# Patient Record
Sex: Female | Born: 1960
Health system: Southern US, Community
[De-identification: ages and names within clinical notes are randomized; demographics above are authoritative.]

## PROBLEM LIST (undated history)

## (undated) DIAGNOSIS — T7840XA Allergy, unspecified, initial encounter: Secondary | ICD-10-CM

## (undated) DIAGNOSIS — F419 Anxiety disorder, unspecified: Secondary | ICD-10-CM

## (undated) DIAGNOSIS — M1990 Unspecified osteoarthritis, unspecified site: Secondary | ICD-10-CM

## (undated) DIAGNOSIS — D649 Anemia, unspecified: Secondary | ICD-10-CM

## (undated) DIAGNOSIS — K219 Gastro-esophageal reflux disease without esophagitis: Secondary | ICD-10-CM

## (undated) DIAGNOSIS — I10 Essential (primary) hypertension: Secondary | ICD-10-CM

## (undated) DIAGNOSIS — E079 Disorder of thyroid, unspecified: Secondary | ICD-10-CM

## (undated) DIAGNOSIS — E119 Type 2 diabetes mellitus without complications: Secondary | ICD-10-CM

## (undated) DIAGNOSIS — E785 Hyperlipidemia, unspecified: Secondary | ICD-10-CM

## (undated) DIAGNOSIS — R011 Cardiac murmur, unspecified: Secondary | ICD-10-CM

## (undated) HISTORY — DX: Hyperlipidemia, unspecified: E78.5

## (undated) HISTORY — DX: Unspecified osteoarthritis, unspecified site: M19.90

## (undated) HISTORY — PX: BREAST BIOPSY: SHX20

## (undated) HISTORY — DX: Cardiac murmur, unspecified: R01.1

## (undated) HISTORY — DX: Gastro-esophageal reflux disease without esophagitis: K21.9

## (undated) HISTORY — DX: Anemia, unspecified: D64.9

## (undated) HISTORY — PX: COLONOSCOPY: SHX174

## (undated) HISTORY — DX: Anxiety disorder, unspecified: F41.9

## (undated) HISTORY — DX: Essential (primary) hypertension: I10

## (undated) HISTORY — PX: PARTIAL HYSTERECTOMY: SHX80

## (undated) HISTORY — DX: Type 2 diabetes mellitus without complications: E11.9

## (undated) HISTORY — DX: Disorder of thyroid, unspecified: E07.9

## (undated) HISTORY — DX: Allergy, unspecified, initial encounter: T78.40XA

---

## 2003-10-27 ENCOUNTER — Encounter: Admission: RE | Admit: 2003-10-27 | Discharge: 2003-10-27 | Payer: Self-pay | Admitting: Family Medicine

## 2003-12-11 ENCOUNTER — Emergency Department (HOSPITAL_COMMUNITY): Admission: EM | Admit: 2003-12-11 | Discharge: 2003-12-11 | Payer: Self-pay | Admitting: Emergency Medicine

## 2004-07-19 ENCOUNTER — Emergency Department (HOSPITAL_COMMUNITY): Admission: EM | Admit: 2004-07-19 | Discharge: 2004-07-19 | Payer: Self-pay | Admitting: Emergency Medicine

## 2004-10-28 ENCOUNTER — Encounter: Admission: RE | Admit: 2004-10-28 | Discharge: 2004-10-28 | Payer: Self-pay | Admitting: Internal Medicine

## 2005-02-25 ENCOUNTER — Emergency Department (HOSPITAL_COMMUNITY): Admission: EM | Admit: 2005-02-25 | Discharge: 2005-02-26 | Payer: Self-pay | Admitting: Emergency Medicine

## 2005-04-17 ENCOUNTER — Emergency Department (HOSPITAL_COMMUNITY): Admission: EM | Admit: 2005-04-17 | Discharge: 2005-04-17 | Payer: Self-pay | Admitting: Emergency Medicine

## 2006-08-21 ENCOUNTER — Other Ambulatory Visit: Admission: RE | Admit: 2006-08-21 | Discharge: 2006-08-21 | Payer: Self-pay | Admitting: Family Medicine

## 2006-11-06 ENCOUNTER — Encounter: Admission: RE | Admit: 2006-11-06 | Discharge: 2006-11-06 | Payer: Self-pay | Admitting: Family Medicine

## 2007-02-17 ENCOUNTER — Emergency Department (HOSPITAL_COMMUNITY): Admission: EM | Admit: 2007-02-17 | Discharge: 2007-02-17 | Payer: Self-pay | Admitting: Emergency Medicine

## 2007-03-04 ENCOUNTER — Emergency Department (HOSPITAL_COMMUNITY): Admission: EM | Admit: 2007-03-04 | Discharge: 2007-03-04 | Payer: Self-pay | Admitting: Emergency Medicine

## 2007-06-24 ENCOUNTER — Ambulatory Visit (HOSPITAL_COMMUNITY): Admission: RE | Admit: 2007-06-24 | Discharge: 2007-06-24 | Payer: Self-pay | Admitting: Hematology & Oncology

## 2007-07-01 ENCOUNTER — Emergency Department (HOSPITAL_COMMUNITY): Admission: EM | Admit: 2007-07-01 | Discharge: 2007-07-01 | Payer: Self-pay | Admitting: Emergency Medicine

## 2007-07-08 ENCOUNTER — Encounter: Payer: Self-pay | Admitting: Endocrinology

## 2007-09-16 ENCOUNTER — Encounter: Payer: Self-pay | Admitting: Endocrinology

## 2007-11-14 ENCOUNTER — Encounter: Payer: Self-pay | Admitting: Endocrinology

## 2007-11-14 ENCOUNTER — Encounter: Admission: RE | Admit: 2007-11-14 | Discharge: 2007-11-14 | Payer: Self-pay | Admitting: Family Medicine

## 2008-01-02 ENCOUNTER — Emergency Department (HOSPITAL_COMMUNITY): Admission: EM | Admit: 2008-01-02 | Discharge: 2008-01-03 | Payer: Self-pay | Admitting: Emergency Medicine

## 2008-03-03 ENCOUNTER — Emergency Department (HOSPITAL_COMMUNITY): Admission: EM | Admit: 2008-03-03 | Discharge: 2008-03-03 | Payer: Self-pay | Admitting: Emergency Medicine

## 2008-03-10 ENCOUNTER — Encounter: Payer: Self-pay | Admitting: Endocrinology

## 2008-04-01 DIAGNOSIS — E559 Vitamin D deficiency, unspecified: Secondary | ICD-10-CM | POA: Insufficient documentation

## 2008-04-01 DIAGNOSIS — E0590 Thyrotoxicosis, unspecified without thyrotoxic crisis or storm: Secondary | ICD-10-CM | POA: Insufficient documentation

## 2008-04-02 ENCOUNTER — Ambulatory Visit: Payer: Self-pay | Admitting: Endocrinology

## 2008-04-09 ENCOUNTER — Encounter (HOSPITAL_COMMUNITY): Admission: RE | Admit: 2008-04-09 | Discharge: 2008-06-04 | Payer: Self-pay | Admitting: Endocrinology

## 2008-04-14 ENCOUNTER — Telehealth: Payer: Self-pay | Admitting: Endocrinology

## 2008-05-15 ENCOUNTER — Ambulatory Visit (HOSPITAL_COMMUNITY): Admission: RE | Admit: 2008-05-15 | Discharge: 2008-05-15 | Payer: Self-pay | Admitting: Endocrinology

## 2008-05-19 ENCOUNTER — Telehealth (INDEPENDENT_AMBULATORY_CARE_PROVIDER_SITE_OTHER): Payer: Self-pay | Admitting: *Deleted

## 2008-09-17 ENCOUNTER — Other Ambulatory Visit: Admission: RE | Admit: 2008-09-17 | Discharge: 2008-09-17 | Payer: Self-pay | Admitting: Family Medicine

## 2008-09-21 ENCOUNTER — Emergency Department (HOSPITAL_COMMUNITY): Admission: EM | Admit: 2008-09-21 | Discharge: 2008-09-21 | Payer: Self-pay | Admitting: Emergency Medicine

## 2008-10-06 ENCOUNTER — Emergency Department (HOSPITAL_COMMUNITY): Admission: EM | Admit: 2008-10-06 | Discharge: 2008-10-06 | Payer: Self-pay | Admitting: Emergency Medicine

## 2008-11-17 ENCOUNTER — Encounter: Admission: RE | Admit: 2008-11-17 | Discharge: 2008-11-17 | Payer: Self-pay | Admitting: Family Medicine

## 2009-11-18 ENCOUNTER — Encounter: Admission: RE | Admit: 2009-11-18 | Discharge: 2009-11-18 | Payer: Self-pay | Admitting: Internal Medicine

## 2009-11-28 ENCOUNTER — Emergency Department (HOSPITAL_COMMUNITY): Admission: EM | Admit: 2009-11-28 | Discharge: 2009-11-28 | Payer: Self-pay | Admitting: Emergency Medicine

## 2010-06-25 ENCOUNTER — Other Ambulatory Visit: Payer: Self-pay | Admitting: Internal Medicine

## 2010-06-25 DIAGNOSIS — Z1239 Encounter for other screening for malignant neoplasm of breast: Secondary | ICD-10-CM

## 2010-06-26 ENCOUNTER — Encounter: Payer: Self-pay | Admitting: Endocrinology

## 2010-06-27 ENCOUNTER — Encounter: Payer: Self-pay | Admitting: Family Medicine

## 2010-07-23 ENCOUNTER — Emergency Department (HOSPITAL_COMMUNITY)
Admission: EM | Admit: 2010-07-23 | Discharge: 2010-07-23 | Discharge status: Against Medical Advice | Payer: Self-pay | Attending: Emergency Medicine | Admitting: Emergency Medicine

## 2010-07-23 DIAGNOSIS — R0609 Other forms of dyspnea: Secondary | ICD-10-CM | POA: Insufficient documentation

## 2010-07-23 DIAGNOSIS — R0989 Other specified symptoms and signs involving the circulatory and respiratory systems: Secondary | ICD-10-CM | POA: Insufficient documentation

## 2010-11-21 ENCOUNTER — Ambulatory Visit
Admission: RE | Admit: 2010-11-21 | Discharge: 2010-11-21 | Disposition: A | Discharge status: Home / Self Care | Payer: Self-pay | Source: Ambulatory Visit | Attending: Internal Medicine | Admitting: Internal Medicine

## 2010-11-21 DIAGNOSIS — Z1239 Encounter for other screening for malignant neoplasm of breast: Secondary | ICD-10-CM

## 2010-12-06 ENCOUNTER — Other Ambulatory Visit: Payer: Self-pay | Admitting: Internal Medicine

## 2010-12-06 DIAGNOSIS — Z1231 Encounter for screening mammogram for malignant neoplasm of breast: Secondary | ICD-10-CM

## 2011-02-23 LAB — BASIC METABOLIC PANEL
CO2: 24
Calcium: 9.4
Chloride: 100
Creatinine, Ser: 0.89
Glucose, Bld: 171 — ABNORMAL HIGH
Potassium: 2.9 — ABNORMAL LOW

## 2011-02-23 LAB — DIFFERENTIAL
Basophils Absolute: 0.1
Basophils Relative: 1
Eosinophils Absolute: 0
Eosinophils Relative: 0
Monocytes Relative: 9
Neutrophils Relative %: 54

## 2011-02-23 LAB — POCT CARDIAC MARKERS: Operator id: 3872

## 2011-02-23 LAB — CBC
HCT: 41.8
MCHC: 34.5
RBC: 5.06
RDW: 14.3

## 2011-03-03 LAB — CBC
HCT: 43.2
RDW: 15.8 — ABNORMAL HIGH

## 2011-03-03 LAB — POCT I-STAT, CHEM 8
Calcium, Ion: 1.17
Chloride: 107
HCT: 44
Hemoglobin: 15
TCO2: 28

## 2011-03-03 LAB — POCT CARDIAC MARKERS
CKMB, poc: <1 — ABNORMAL LOW
Myoglobin, poc: 54.1
Troponin i, poc: <0.05

## 2011-03-03 LAB — DIFFERENTIAL
Eosinophils Absolute: 0
Eosinophils Relative: 0
Lymphocytes Relative: 22
Lymphs Abs: 2
Neutro Abs: 6.3

## 2011-03-06 LAB — GLUCOSE, CAPILLARY

## 2011-10-10 ENCOUNTER — Encounter: Payer: Self-pay | Admitting: Internal Medicine

## 2011-11-22 ENCOUNTER — Other Ambulatory Visit: Payer: Self-pay | Admitting: Internal Medicine

## 2011-11-22 ENCOUNTER — Ambulatory Visit
Admission: RE | Admit: 2011-11-22 | Discharge: 2011-11-22 | Disposition: A | Discharge status: Home / Self Care | Payer: 59 | Source: Ambulatory Visit | Attending: Internal Medicine | Admitting: Internal Medicine

## 2011-11-22 DIAGNOSIS — Z1231 Encounter for screening mammogram for malignant neoplasm of breast: Secondary | ICD-10-CM

## 2012-01-03 ENCOUNTER — Encounter: Payer: Self-pay | Admitting: Internal Medicine

## 2012-01-30 ENCOUNTER — Ambulatory Visit (AMBULATORY_SURGERY_CENTER): Payer: 59

## 2012-01-30 VITALS — Ht 65.0 in | Wt 221.5 lb

## 2012-01-30 DIAGNOSIS — Z1211 Encounter for screening for malignant neoplasm of colon: Secondary | ICD-10-CM

## 2012-01-30 MED ORDER — MOVIPREP 100 G PO SOLR: ORAL | Status: DC

## 2012-02-13 ENCOUNTER — Encounter: Payer: Self-pay | Admitting: Internal Medicine

## 2012-02-13 ENCOUNTER — Ambulatory Visit (AMBULATORY_SURGERY_CENTER): Payer: 59 | Admitting: Internal Medicine

## 2012-02-13 VITALS — BP 140/90 | HR 82 | Temp 97.3°F | Resp 17 | Ht 65.0 in | Wt 221.0 lb

## 2012-02-13 DIAGNOSIS — Z1211 Encounter for screening for malignant neoplasm of colon: Secondary | ICD-10-CM

## 2012-02-13 DIAGNOSIS — D126 Benign neoplasm of colon, unspecified: Secondary | ICD-10-CM

## 2012-02-13 MED ORDER — SODIUM CHLORIDE 0.9 % IV SOLN: 500.0000 mL | INTRAVENOUS | Status: DC

## 2012-02-13 NOTE — Progress Notes (Signed)
Patient did not experience any of the following events: a burn prior to discharge; a fall within the facility; wrong site/side/patient/procedure/implant event; or a hospital transfer or hospital admission upon discharge from the facility. (G8907) Patient did not have preoperative order for IV antibiotic SSI prophylaxis. (G8918)  

## 2012-02-13 NOTE — Patient Instructions (Addendum)
Discharge instructions given with verbal understanding. Handouts on polyps given. Resume previous medications. YOU HAD AN ENDOSCOPIC PROCEDURE TODAY AT THE [facility]: Refer to the procedure report that was given to you for any specific questions about what was found during the examination.  If the procedure report does not answer your questions, please call your gastroenterologist to clarify.  If you requested that your care partner not be given the details of your procedure findings, then the procedure report has been included in a sealed envelope for you to review at your convenience later.  YOU SHOULD EXPECT: Some feelings of bloating in the abdomen. Passage of more gas than usual.  Walking can help get rid of the air that was put into your GI tract during the procedure and reduce the bloating. If you had a lower endoscopy (such as a colonoscopy or flexible sigmoidoscopy) you may notice spotting of blood in your stool or on the toilet paper. If you underwent a bowel prep for your procedure, then you may not have a normal bowel movement for a few days.  DIET: Your first meal following the procedure should be a light meal and then it is ok to progress to your normal diet.  A half-sandwich or bowl of soup is an example of a good first meal.  Heavy or fried foods are harder to digest and may make you feel nauseous or bloated.  Likewise meals heavy in dairy and vegetables can cause extra gas to form and this can also increase the bloating.  Drink plenty of fluids but you should avoid alcoholic beverages for 24 hours.  ACTIVITY: Your care partner should take you home directly after the procedure.  You should plan to take it easy, moving slowly for the rest of the day.  You can resume normal activity the day after the procedure however you should NOT DRIVE or use heavy machinery for 24 hours (because of the sedation medicines used during the test).    SYMPTOMS TO REPORT IMMEDIATELY: A  gastroenterologist can be reached at any hour.  During normal business hours, 8:30 AM to 5:00 PM Monday through Friday, call [phone].  After hours and on weekends, please call the GI answering service at [phone] who will take a message and have the physician on call contact you.   Following lower endoscopy (colonoscopy or flexible sigmoidoscopy):  Excessive amounts of blood in the stool  Significant tenderness or worsening of abdominal pains  Swelling of the abdomen that is new, acute  Fever of 100F or higher  FOLLOW UP: If any biopsies were taken you will be contacted by phone or by letter within the next 1-3 weeks.  Call your gastroenterologist if you have not heard about the biopsies in 3 weeks.  Our staff will call the home number listed on your records the next business day following your procedure to check on you and address any questions or concerns that you may have at that time regarding the information given to you following your procedure. This is a courtesy call and so if there is no answer at the home number and we have not heard from you through the emergency physician on call, we will assume that you have returned to your regular daily activities without incident.  SIGNATURES/CONFIDENTIALITY: You and/or your care partner have signed paperwork which will be entered into your electronic medical record.  These signatures attest to the fact that that the information above on your After Visit Summary has   been reviewed and is understood.  Full responsibility of the confidentiality of this discharge information lies with you and/or your care-partner.  

## 2012-02-13 NOTE — Op Note (Signed)
[facility] [address] [place], [address]   COLONOSCOPY PROCEDURE REPORT  PATIENT: Jodi, Wade  MR#: [id number] BIRTHDATE: July 02, 1960 , 51  yrs. old GENDER: Female ENDOSCOPIST: Hart Carwin, MD REFERRED BY:  Fleet Contras, M.D.  Minus Breeding, M.D. PROCEDURE DATE:  02/13/2012 PROCEDURE:   Colonoscopy with cold biopsy polypectomy ASA CLASS:   Class II INDICAcations: screening , average risk MEDICATIONS: MAC sedation, administered by CRNA and Propofol (Diprivan) 440 mg IV  DESCRIPTION OF PROCEDURE:   After the risks and benefits and of the procedure were explained, informed consent was obtained.  A digital rectal exam revealed no abnormalities of the rectum.    The LB [id number]  endoscope was introduced through the anus and advanced to the cecum, which was identified by both the appendix and ileocecal valve .  The quality of the prep was excellent, using MoviPrep .  The instrument was then slowly withdrawn as the colon was fully examined.     COLON FINDINGS: Four sessile polyps ranging between 3-85mm in size were found in the sigmoid colon.  4 diminutive polyps at 25 cm.  A polypectomy was performed with cold forceps.  The resection was complete and the polyp tissue was completely retrieved. Retroflexed views revealed no abnormalities.     The scope was then withdrawn from the patient and the procedure completed.  COMPLICATIONS: There were no complications. ENDOSCOPIC IMPRESSION: Four sessile polyps ranging between 3-61mm in size were found in the sigmoid colon at 25 cm,  polypectomy was performed with cold forceps  RECOMMENDATIONS: 1.  await pathology results 2.  High fiber diet   REPEAT EXAM: In 10 year(s)  for Colonoscopy.  cc:  _______________________________ eSignedHart Carwin, MD 02/13/2012 9:36 AM     PATIENT NAME:  Jodi Wade MR#: [id number]

## 2012-02-14 ENCOUNTER — Telehealth: Payer: Self-pay | Admitting: *Deleted

## 2012-02-14 NOTE — Telephone Encounter (Signed)
MESSAGE LEFT FOR THE PATIENT.

## 2012-02-20 ENCOUNTER — Encounter: Payer: Self-pay | Admitting: Internal Medicine

## 2012-08-07 ENCOUNTER — Encounter (HOSPITAL_COMMUNITY): Payer: Self-pay | Admitting: Emergency Medicine

## 2012-08-07 ENCOUNTER — Emergency Department (HOSPITAL_COMMUNITY)
Admission: EM | Admit: 2012-08-07 | Discharge: 2012-08-07 | Disposition: A | Discharge status: Home / Self Care | Payer: 59 | Attending: Emergency Medicine | Admitting: Emergency Medicine

## 2012-08-07 DIAGNOSIS — I10 Essential (primary) hypertension: Secondary | ICD-10-CM | POA: Insufficient documentation

## 2012-08-07 DIAGNOSIS — R109 Unspecified abdominal pain: Secondary | ICD-10-CM | POA: Insufficient documentation

## 2012-08-07 DIAGNOSIS — E079 Disorder of thyroid, unspecified: Secondary | ICD-10-CM | POA: Insufficient documentation

## 2012-08-07 DIAGNOSIS — Z90710 Acquired absence of both cervix and uterus: Secondary | ICD-10-CM | POA: Insufficient documentation

## 2012-08-07 DIAGNOSIS — Z79899 Other long term (current) drug therapy: Secondary | ICD-10-CM | POA: Insufficient documentation

## 2012-08-07 DIAGNOSIS — K30 Functional dyspepsia: Secondary | ICD-10-CM

## 2012-08-07 DIAGNOSIS — K3189 Other diseases of stomach and duodenum: Secondary | ICD-10-CM | POA: Insufficient documentation

## 2012-08-07 DIAGNOSIS — Z8719 Personal history of other diseases of the digestive system: Secondary | ICD-10-CM | POA: Insufficient documentation

## 2012-08-07 LAB — COMPREHENSIVE METABOLIC PANEL
ALT: 13 U/L (ref 0–35)
AST: 14 U/L (ref 0–37)
Albumin: 3.7 g/dL (ref 3.5–5.2)
Alkaline Phosphatase: 94 U/L (ref 39–117)
BUN: 10 mg/dL (ref 6–23)
CO2: 26 mEq/L (ref 19–32)
Calcium: 9.5 mg/dL (ref 8.4–10.5)
Chloride: 103 mEq/L (ref 96–112)
Creatinine, Ser: 0.8 mg/dL (ref 0.50–1.10)
GFR calc Af Amer: >90 mL/min (ref >90)
GFR calc non Af Amer: 84 mL/min — ABNORMAL LOW (ref >90)
Glucose, Bld: 113 mg/dL — ABNORMAL HIGH (ref 70–99)
Potassium: 3.8 mEq/L (ref 3.5–5.1)
Sodium: 138 mEq/L (ref 135–145)
Total Bilirubin: 0.3 mg/dL (ref 0.3–1.2)
Total Protein: 7.7 g/dL (ref 6.0–8.3)

## 2012-08-07 LAB — LIPASE, BLOOD: Lipase: 17 U/L (ref 11–59)

## 2012-08-07 MED ORDER — PANTOPRAZOLE SODIUM 40 MG PO TBEC
40.0000 mg | DELAYED_RELEASE_TABLET | Freq: Once | ORAL | Status: AC
  Administered 2012-08-07: 40 mg via ORAL
  Filled 2012-08-07: qty 1

## 2012-08-07 MED ORDER — GI COCKTAIL ~~LOC~~
30.0000 mL | Freq: Once | ORAL | Status: AC
  Administered 2012-08-07: 30 mL via ORAL
  Filled 2012-08-07: qty 30

## 2012-08-07 MED ORDER — OMEPRAZOLE 20 MG PO CPDR: 20.0000 mg | DELAYED_RELEASE_CAPSULE | Freq: Every day | ORAL | Status: DC

## 2012-08-07 NOTE — ED Provider Notes (Signed)
History     CSN: [id number]  Arrival date & time 08/07/12  1020   First MD Initiated Contact with Patient 08/07/12 1053      Chief Complaint  Patient presents with  . Chest Pain    (Consider location/radiation/quality/duration/timing/severity/associated sxs/prior treatment) HPI Patient lives the emergency department with upper abdominal and lower chest pain.  Patient, states, that she ate a bowl of chili at 7 AM she states that she started having burning in her upper abdomen and into her lower chest about 9:45 AM.  Patient, states, that she's a history of GERD in the past.  Patient denies shortness of breath, headache, visual changes, syncope, dizziness, back pain, weakness, nausea, vomiting, diarrhea, fever, or cough.  Past Medical History  Diagnosis Date  . Hypertension   . Thyroid disease   . Allergy     Past Surgical History  Procedure Laterality Date  . Partial hysterectomy      abdominal    No family history on file.  History  Substance Use Topics  . Smoking status: Never Smoker   . Smokeless tobacco: Never Used  . Alcohol Use: No    OB History   Grav Para Term Preterm Abortions TAB SAB Ect Mult Living                  Review of Systems All other systems negative except as documented in the HPI. All pertinent positives and negatives as reviewed in the HPI. Allergies  Fexofenadine-pseudoephed er; Hydrochlorothiazide; and Pseudoephedrine  Home Medications   Current Outpatient Rx  Name  Route  Sig  Dispense  Refill  . amLODipine (NORVASC) 10 MG tablet   Oral   Take 10 mg by mouth daily.         Marland Kitchen levothyroxine (SYNTHROID, LEVOTHROID) 100 MCG tablet   Oral   Take 100 mcg by mouth daily.         Marland Kitchen lisinopril (PRINIVIL,ZESTRIL) 20 MG tablet   Oral   Take 20 mg by mouth daily.         . Vitamin D, Ergocalciferol, (DRISDOL) 50000 UNITS CAPS   Oral   Take 50,000 Units by mouth every 7 (seven) days. thursday           BP 180/113  Pulse 92   Temp(Src) 97.9 F (36.6 C) (Oral)  Resp 16  SpO2 100%  Physical Exam  Nursing note and vitals reviewed. Constitutional: She is oriented to person, place, and time. She appears well-developed and well-nourished. No distress.  HENT:  Head: Normocephalic and atraumatic.  Mouth/Throat: Oropharynx is clear and moist. No oropharyngeal exudate.  Eyes: Pupils are equal, round, and reactive to light.  Neck: Normal range of motion. Neck supple.  Cardiovascular: Normal rate, regular rhythm and normal heart sounds.  Exam reveals no gallop and no friction rub.   No murmur heard. Pulmonary/Chest: Effort normal and breath sounds normal. No respiratory distress.  Abdominal: Soft. Bowel sounds are normal. She exhibits no distension. There is no tenderness. There is no rebound.  Neurological: She is alert and oriented to person, place, and time.  Skin: Skin is warm and dry. No rash noted.    ED Course  Procedures (including critical care time)  Labs Reviewed  COMPREHENSIVE METABOLIC PANEL - Abnormal; Notable for the following:    Glucose, Bld 113 (*)    GFR calc non Af Amer 84 (*)    All other components within normal limits  LIPASE, BLOOD   Patient feels better  following, Protonix and GI cocktail.  Patient also, states that the belching helped with her discomfort.  Patient is referred back to her primary care Dr. told to return to the emergency department as needed.    MDM    Date: 08/07/2012  Rate: 90  Rhythm: normal sinus rhythm  QRS Axis: normal  Intervals: normal  ST/T Wave abnormalities: normal  Conduction Disutrbances:none  Narrative Interpretation:   Old EKG Reviewed: unchanged         Carlyle Dolly, PA-C 08/11/12 2342

## 2012-08-07 NOTE — ED Notes (Signed)
Pt reports that she if feeling better and is ready to be discharged, will inform PA and monitor.

## 2012-08-07 NOTE — ED Notes (Signed)
Pt c/o burning sensation in chest pain w/ radiation to back and belching since about 15 minutes ago.  Pt ate chili for breakfast this morning.

## 2012-08-12 NOTE — ED Provider Notes (Signed)
Medical screening examination/treatment/procedure(s) were performed by non-physician practitioner and as supervising physician I was immediately available for consultation/collaboration.  Gilda Crease, MD 08/12/12 [phone]

## 2012-11-01 ENCOUNTER — Emergency Department (HOSPITAL_COMMUNITY)
Admission: EM | Admit: 2012-11-01 | Discharge: 2012-11-01 | Disposition: A | Discharge status: Home / Self Care | Payer: 59 | Source: Home / Self Care

## 2012-11-14 ENCOUNTER — Encounter (HOSPITAL_COMMUNITY): Payer: Self-pay

## 2012-11-14 ENCOUNTER — Emergency Department (INDEPENDENT_AMBULATORY_CARE_PROVIDER_SITE_OTHER)
Admission: EM | Admit: 2012-11-14 | Discharge: 2012-11-14 | Disposition: A | Discharge status: Home / Self Care | Payer: Self-pay | Source: Home / Self Care

## 2012-11-14 DIAGNOSIS — S46911A Strain of unspecified muscle, fascia and tendon at shoulder and upper arm level, right arm, initial encounter: Secondary | ICD-10-CM

## 2012-11-14 DIAGNOSIS — M609 Myositis, unspecified: Secondary | ICD-10-CM

## 2012-11-14 DIAGNOSIS — IMO0002 Reserved for concepts with insufficient information to code with codable children: Secondary | ICD-10-CM

## 2012-11-14 DIAGNOSIS — IMO0001 Reserved for inherently not codable concepts without codable children: Secondary | ICD-10-CM

## 2012-11-14 MED ORDER — PREDNISONE 20 MG PO TABS: ORAL_TABLET | ORAL | Status: DC

## 2012-11-14 MED ORDER — KETOROLAC TROMETHAMINE 30 MG/ML IJ SOLN
INTRAMUSCULAR | Status: AC
  Filled 2012-11-14: qty 1

## 2012-11-14 MED ORDER — CYCLOBENZAPRINE HCL 5 MG PO TABS: ORAL_TABLET | ORAL | Status: DC

## 2012-11-14 MED ORDER — HYDROCODONE-ACETAMINOPHEN 7.5-325 MG PO TABS: 1.0000 | ORAL_TABLET | ORAL | Status: DC | PRN

## 2012-11-14 MED ORDER — KETOROLAC TROMETHAMINE 30 MG/ML IJ SOLN: 30.0000 mg | Freq: Once | INTRAMUSCULAR | Status: DC
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
## 2012-11-14 NOTE — ED Provider Notes (Signed)
Medical screening examination/treatment/procedure(s) were performed by non-physician practitioner and as supervising physician I was immediately available for consultation/collaboration.  Leslee Home, M.D.  Reuben Likes, MD 11/14/12 2059

## 2012-11-14 NOTE — ED Notes (Signed)
Patient refused IM toradol
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
## 2012-11-22 ENCOUNTER — Ambulatory Visit
Admission: RE | Admit: 2012-11-22 | Discharge: 2012-11-22 | Disposition: A | Discharge status: Home / Self Care | Payer: 59 | Source: Ambulatory Visit | Attending: Internal Medicine | Admitting: Internal Medicine

## 2012-11-22 DIAGNOSIS — Z1231 Encounter for screening mammogram for malignant neoplasm of breast: Secondary | ICD-10-CM

## 2012-12-24 ENCOUNTER — Other Ambulatory Visit (HOSPITAL_COMMUNITY): Payer: Self-pay | Admitting: Internal Medicine

## 2012-12-24 ENCOUNTER — Encounter: Payer: Self-pay | Admitting: Obstetrics

## 2012-12-24 ENCOUNTER — Ambulatory Visit (HOSPITAL_COMMUNITY)
Admission: RE | Admit: 2012-12-24 | Discharge: 2012-12-24 | Disposition: A | Discharge status: Home / Self Care | Payer: 59 | Source: Ambulatory Visit | Attending: Internal Medicine | Admitting: Internal Medicine

## 2012-12-24 DIAGNOSIS — M25519 Pain in unspecified shoulder: Secondary | ICD-10-CM | POA: Insufficient documentation

## 2012-12-24 DIAGNOSIS — M25511 Pain in right shoulder: Secondary | ICD-10-CM

## 2013-01-20 ENCOUNTER — Ambulatory Visit: Payer: Self-pay | Admitting: Obstetrics

## 2013-02-04 ENCOUNTER — Ambulatory Visit: Payer: Self-pay | Admitting: Obstetrics

## 2013-04-10 ENCOUNTER — Other Ambulatory Visit: Payer: Self-pay

## 2013-06-17 ENCOUNTER — Other Ambulatory Visit: Payer: Self-pay

## 2013-06-17 DIAGNOSIS — Z1231 Encounter for screening mammogram for malignant neoplasm of breast: Secondary | ICD-10-CM

## 2013-11-24 ENCOUNTER — Ambulatory Visit
Admission: RE | Admit: 2013-11-24 | Discharge: 2013-11-24 | Disposition: A | Discharge status: Home / Self Care | Payer: 59 | Source: Ambulatory Visit

## 2013-11-24 DIAGNOSIS — Z1231 Encounter for screening mammogram for malignant neoplasm of breast: Secondary | ICD-10-CM

## 2014-03-20 ENCOUNTER — Other Ambulatory Visit: Payer: Self-pay

## 2014-05-09 ENCOUNTER — Encounter: Payer: 59 | Attending: Internal Medicine

## 2014-05-09 VITALS — Ht 65.0 in | Wt 216.8 lb

## 2014-05-09 DIAGNOSIS — E119 Type 2 diabetes mellitus without complications: Secondary | ICD-10-CM | POA: Insufficient documentation

## 2014-05-09 DIAGNOSIS — Z713 Dietary counseling and surveillance: Secondary | ICD-10-CM | POA: Diagnosis not present

## 2014-05-11 NOTE — Progress Notes (Signed)
Patient was seen on 05/09/14 for the complete diabetes self-management series at the [facility]. This is a part of the [facility] [employer].  Handouts given during class include:  Living Well with Diabetes book  Carb Counting and Meal Planning book  Meal Plan Card  Carbohydrate guide  Meal planning worksheet  Low Sodium Flavoring Tips  The diabetes portion plate  Low Carbohydrate Snack Suggestions  A1c to eAG Conversion Chart  Diabetes Medications  Stress Management  Diabetes Recommended Care Schedule  Diabetes Success Plan  Core Class Satisfaction Survey  The following learning objectives were met by the patient during this course:  Describe diabetes  State some common risk factors for diabetes  Defines the role of glucose and insulin  Identifies type of diabetes and pathophysiology  Describe the relationship between diabetes and cardiovascular risk  State the members of the Healthcare Team  States the rationale for glucose monitoring  State when to test glucose  State their individual Target Range  State the importance of logging glucose readings  Describe how to interpret glucose readings  Identifies A1C target  Explain the correlation between A1c and eAG values  State symptoms and treatment of high blood glucose  State symptoms and treatment of low blood glucose  Explain proper technique for glucose testing  Identifies proper sharps disposal  Describe the role of different macronutrients on glucose  Explain how carbohydrates affect blood glucose  State what foods contain the most carbohydrates  Demonstrate carbohydrate counting  Demonstrate how to read Nutrition Facts food label  Describe effects of various fats on heart health  Describe the importance of good nutrition for health and healthy eating strategies  Describe techniques for managing your shopping, cooking and meal planning  List  strategies to follow meal plan when dining out  Describe the effects of alcohol on glucose and how to use it safely . State the amount of activity recommended for healthy living . Describe activities suitable for individual needs . Identify ways to regularly incorporate activity into daily life . Identify barriers to activity and ways to over come these barriers  Identify diabetes medications being personally used and their primary action for lowering glucose and possible side effects . Describe role of stress on blood glucose and develop strategies to address psychosocial issues . Identify diabetes complications and ways to prevent them  Explain how to manage diabetes during illness . Evaluate success in meeting personal goal . Establish 2-3 goals that they will plan to diligently work on until they return for the  43-monthfollow-up visit  Goals:   I will count my carb choices at most meals and snacks  I will be active 30 minutes or more 4-5 times a week  I will take my diabetes medications as scheduled  I will test my glucose at least 2 times a day, 5 days a week  I will look at patterns in my record book at least 30 days a month  To help manage stress I will  Exercise at least 4-5 times a week  Your patient has identified these potential barriers to change:  None noted  Your patient has identified their diabetes self-care support plan as  [facility]  Plan: [facility] up with [facility] [facility]

## 2014-08-24 ENCOUNTER — Other Ambulatory Visit: Payer: Self-pay

## 2014-08-24 DIAGNOSIS — Z1231 Encounter for screening mammogram for malignant neoplasm of breast: Secondary | ICD-10-CM

## 2014-08-24 DIAGNOSIS — Z803 Family history of malignant neoplasm of breast: Secondary | ICD-10-CM

## 2014-09-18 ENCOUNTER — Encounter (HOSPITAL_COMMUNITY): Payer: Self-pay

## 2014-09-18 ENCOUNTER — Emergency Department (HOSPITAL_COMMUNITY)
Admission: EM | Admit: 2014-09-18 | Discharge: 2014-09-18 | Disposition: A | Discharge status: Home / Self Care | Payer: 59 | Attending: Emergency Medicine | Admitting: Emergency Medicine

## 2014-09-18 ENCOUNTER — Emergency Department (HOSPITAL_COMMUNITY): Payer: 59

## 2014-09-18 DIAGNOSIS — F410 Panic disorder [episodic paroxysmal anxiety] without agoraphobia: Secondary | ICD-10-CM | POA: Diagnosis not present

## 2014-09-18 DIAGNOSIS — E079 Disorder of thyroid, unspecified: Secondary | ICD-10-CM | POA: Diagnosis not present

## 2014-09-18 DIAGNOSIS — Z79899 Other long term (current) drug therapy: Secondary | ICD-10-CM | POA: Diagnosis not present

## 2014-09-18 DIAGNOSIS — R202 Paresthesia of skin: Secondary | ICD-10-CM | POA: Insufficient documentation

## 2014-09-18 DIAGNOSIS — R000 Tachycardia, unspecified: Secondary | ICD-10-CM | POA: Insufficient documentation

## 2014-09-18 DIAGNOSIS — E119 Type 2 diabetes mellitus without complications: Secondary | ICD-10-CM | POA: Diagnosis not present

## 2014-09-18 DIAGNOSIS — I10 Essential (primary) hypertension: Secondary | ICD-10-CM | POA: Diagnosis not present

## 2014-09-18 LAB — CBC WITH DIFFERENTIAL/PLATELET
BASOS ABS: 0 10*3/uL (ref 0.0–0.1)
BASOS PCT: 0 % (ref 0–1)
EOS ABS: 0 10*3/uL (ref 0.0–0.7)
EOS PCT: 1 % (ref 0–5)
HEMATOCRIT: 43 % (ref 36.0–46.0)
HEMOGLOBIN: 14.4 g/dL (ref 12.0–15.0)
LYMPHS ABS: 1.5 10*3/uL (ref 0.7–4.0)
Lymphocytes Relative: 19 % (ref 12–46)
MCH: 26.9 pg (ref 26.0–34.0)
MCHC: 33.5 g/dL (ref 30.0–36.0)
MCV: 80.2 fL (ref 78.0–100.0)
Monocytes Absolute: 0.5 10*3/uL (ref 0.1–1.0)
Monocytes Relative: 7 % (ref 3–12)
Neutro Abs: 5.9 10*3/uL (ref 1.7–7.7)
Neutrophils Relative %: 73 % (ref 43–77)
PLATELETS: 322 10*3/uL (ref 150–400)
RBC: 5.36 MIL/uL — ABNORMAL HIGH (ref 3.87–5.11)
RDW: 14.4 % (ref 11.5–15.5)
WBC: 8.1 10*3/uL (ref 4.0–10.5)

## 2014-09-18 LAB — BASIC METABOLIC PANEL
Anion gap: 8 (ref 5–15)
BUN: 9 mg/dL (ref 6–23)
CALCIUM: 9.4 mg/dL (ref 8.4–10.5)
CHLORIDE: 108 mmol/L (ref 96–112)
CO2: 24 mmol/L (ref 19–32)
Creatinine, Ser: 0.76 mg/dL (ref 0.50–1.10)
GFR calc Af Amer: >90 mL/min (ref >90)
GFR calc non Af Amer: >90 mL/min (ref >90)
GLUCOSE: 120 mg/dL — AB (ref 70–99)
POTASSIUM: 3.6 mmol/L (ref 3.5–5.1)
Sodium: 140 mmol/L (ref 135–145)

## 2014-09-18 LAB — I-STAT TROPONIN, ED: Troponin i, poc: 0 ng/mL (ref 0.00–0.08)

## 2014-09-18 MED ORDER — HYDROXYZINE HCL 25 MG PO TABS: 25.0000 mg | ORAL_TABLET | Freq: Four times a day (QID) | ORAL | Status: DC | PRN

## 2014-09-18 NOTE — Discharge Instructions (Signed)
Please follow with your primary care doctor in the next 2 days for a check-up. They must obtain records for further management.   Do not hesitate to return to the Emergency Department for any new, worsening or concerning symptoms.    Panic Attacks Panic attacks are sudden, short-livedsurges of severe anxiety, fear, or discomfort. They may occur for no reason when you are relaxed, when you are anxious, or when you are sleeping. Panic attacks may occur for a number of reasons:   Healthy people occasionally have panic attacks in extreme, life-threatening situations, such as war or natural disasters. Normal anxiety is a protective mechanism of the body that helps [place] react to danger (fight or flight response).  Panic attacks are often seen with anxiety disorders, such as panic disorder, social anxiety disorder, generalized anxiety disorder, and phobias. Anxiety disorders cause excessive or uncontrollable anxiety. They may interfere with your relationships or other life activities.  Panic attacks are sometimes seen with other mental illnesses, such as depression and posttraumatic stress disorder.  Certain medical conditions, prescription medicines, and drugs of abuse can cause panic attacks. SYMPTOMS  Panic attacks start suddenly, peak within 20 minutes, and are accompanied by four or more of the following symptoms:  Pounding heart or fast heart rate (palpitations).  Sweating.  Trembling or shaking.  Shortness of breath or feeling smothered.  Feeling choked.  Chest pain or discomfort.  Nausea or strange feeling in your stomach.  Dizziness, light-headedness, or feeling like you will faint.  Chills or hot flushes.  Numbness or tingling in your lips or hands and feet.  Feeling that things are not real or feeling that you are not yourself.  Fear of losing control or going crazy.  Fear of dying. Some of these symptoms can mimic serious medical conditions. For example, you may think  you are having a heart attack. Although panic attacks can be very scary, they are not life threatening. DIAGNOSIS  Panic attacks are diagnosed through an assessment by your health care provider. Your health care provider will ask questions about your symptoms, such as where and when they occurred. Your health care provider will also ask about your medical history and use of alcohol and drugs, including prescription medicines. Your health care provider may order blood tests or other studies to rule out a serious medical condition. Your health care provider may refer you to a mental health professional for further evaluation. TREATMENT   Most healthy people who have one or two panic attacks in an extreme, life-threatening situation will not require treatment.  The treatment for panic attacks associated with anxiety disorders or other mental illness typically involves counseling with a mental health professional, medicine, or a combination of both. Your health care provider will help determine what treatment is best for you.  Panic attacks due to physical illness usually go away with treatment of the illness. If prescription medicine is causing panic attacks, talk with your health care provider about stopping the medicine, decreasing the dose, or substituting another medicine.  Panic attacks due to alcohol or drug abuse go away with abstinence. Some adults need professional help in order to stop drinking or using drugs. HOME CARE INSTRUCTIONS   Take all medicines as directed by your health care provider.   Schedule and attend follow-up visits as directed by your health care provider. It is important to keep all your appointments. SEEK MEDICAL CARE IF:  You are not able to take your medicines as prescribed.  Your symptoms do  not improve or get worse. SEEK IMMEDIATE MEDICAL CARE IF:   You experience panic attack symptoms that are different than your usual symptoms.  You have serious thoughts  about hurting yourself or others.  You are taking medicine for panic attacks and have a serious side effect. MAKE SURE YOU:  Understand these instructions.  Will watch your condition.  Will get help right away if you are not doing well or get worse. Document Released: 05/22/2005 Document Revised: 05/27/2013 Document Reviewed: 01/03/2013 [facility] Patient Information 2015 [place], [place]. This information is not intended to replace advice given to you by your health care provider. Make sure you discuss any questions you have with your health care provider.

## 2014-09-18 NOTE — ED Notes (Signed)
Pt presents with tingling to both hands, runny nose and vague description of "not feeling right" while on the elliptical at the gym.  Pt reports feeling lasted a few seconds.  Pt denies any symptoms at present, reports CBG was 98 early this morning.

## 2014-09-18 NOTE — ED Provider Notes (Signed)
CSN: [id number]     Arrival date & time 09/18/14  1247 History   First MD Initiated Contact with Patient 09/18/14 1314     Chief Complaint  Patient presents with  . Panic Attack     (Consider location/radiation/quality/duration/timing/severity/associated sxs/prior Treatment) HPI   Jodi Wade is a 54 y.o. female complaining of paresthesias to bilateral hands, patient was running on the elliptical machine, her trainer advised her to get her heart rate to 136, when the heart rate read it was 160. Patient got off the machine and try to calm down. She felt her pulse in her carotids. She felt slightly lightheaded. Patient denies chest pain, shortness of breath, nausea, vomiting, syncope, fever, chills, change in vision, dysarthria, ataxia. Patient said that she started to feel panicky after she saw her heart rate, this is consistent with prior episodes of panic attacks.  Past Medical History  Diagnosis Date  . Hypertension   . Thyroid disease   . Allergy   . Diabetes mellitus without complication    Past Surgical History  Procedure Laterality Date  . Partial hysterectomy      abdominal   Family History  Problem Relation Age of Onset  . Diabetes Other   . Hypertension Other    History  Substance Use Topics  . Smoking status: Never Smoker   . Smokeless tobacco: Never Used  . Alcohol Use: No   OB History    No data available     Review of Systems  10 systems reviewed and found to be negative, except as noted in the HPI.   Allergies  Fexofenadine-pseudoephed er; Hydrochlorothiazide; and Pseudoephedrine  Home Medications   Prior to Admission medications   Medication Sig Start Date End Date Taking? Authorizing Provider  amLODipine (NORVASC) 10 MG tablet Take 10 mg by mouth daily.    Historical Provider, MD  cyclobenzaprine (FLEXERIL) 5 MG tablet One tablet up to every 8 hours as needed for muscle relaxant. 11/14/12   Janne Napoleon, NP  HYDROcodone-acetaminophen (NORCO)  7.5-325 MG per tablet Take 1 tablet by mouth every 4 (four) hours as needed for pain. 11/14/12   Janne Napoleon, NP  hydrOXYzine (ATARAX/VISTARIL) 25 MG tablet Take 1 tablet (25 mg total) by mouth every 6 (six) hours as needed. 09/18/14   Teal Raben, PA-C  levothyroxine (SYNTHROID, LEVOTHROID) 100 MCG tablet Take 100 mcg by mouth daily.    Historical Provider, MD  lisinopril (PRINIVIL,ZESTRIL) 20 MG tablet Take 20 mg by mouth daily.    Historical Provider, MD  metFORMIN (GLUCOPHAGE) 500 MG tablet Take 500 mg by mouth daily with breakfast.    Historical Provider, MD  omeprazole (PRILOSEC) 20 MG capsule Take 1 capsule (20 mg total) by mouth daily. 08/07/12   Dalia Heading, PA-C  predniSONE (DELTASONE) 20 MG tablet Take 3 tabs po on first day, 2 tabs second day, 2 tabs third day, 1 tab fourth day, 1 tab 5th day. Take with food. 11/14/12   Janne Napoleon, NP  Vitamin D, Ergocalciferol, (DRISDOL) 50000 UNITS CAPS Take 50,000 Units by mouth every 7 (seven) days. thursday    Historical Provider, MD   BP 155/97 mmHg  Pulse 90  Temp(Src) 98.3 F (36.8 C) (Oral)  Resp 18  Ht 5\' 5"  (1.651 m)  Wt 194 lb 6.4 oz (88.179 kg)  BMI 32.35 kg/m2  SpO2 99% Physical Exam  Constitutional: She is oriented to person, place, and time. She appears well-developed and well-nourished. No distress.  HENT:  Head: Normocephalic  and atraumatic.  Mouth/Throat: Oropharynx is clear and moist.  Eyes: Conjunctivae and EOM are normal. Pupils are equal, round, and reactive to light.  Neck: Normal range of motion.  Cardiovascular: Normal rate, regular rhythm and intact distal pulses.   Pulmonary/Chest: Effort normal and breath sounds normal. No stridor. No respiratory distress. She has no wheezes. She has no rales. She exhibits no tenderness.  Abdominal: Soft. Bowel sounds are normal. She exhibits no distension and no mass. There is no tenderness. There is no rebound and no guarding.  Musculoskeletal: Normal range of motion. She  exhibits no edema or tenderness.  Neurological: She is alert and oriented to person, place, and time.  Psychiatric: She has a normal mood and affect.  Nursing note and vitals reviewed.   ED Course  Procedures (including critical care time) Labs Review Labs Reviewed  CBC WITH DIFFERENTIAL/PLATELET - Abnormal; Notable for the following:    RBC 5.36 (*)    All other components within normal limits  BASIC METABOLIC PANEL - Abnormal; Notable for the following:    Glucose, Bld 120 (*)    All other components within normal limits  I-STAT TROPOININ, ED    Imaging Review Dg Chest 2 View  09/18/2014   CLINICAL DATA:  Trachycardia, Blurred vision all during heavy workout on elliptical Hx: HTN, Diabetes, Non-smoker  EXAM: CHEST  2 VIEW  COMPARISON:  01/02/2008  FINDINGS: The heart size and mediastinal contours are within normal limits. Both lungs are clear. The visualized skeletal structures are unremarkable.  IMPRESSION: No active cardiopulmonary disease.   Electronically Signed   By: Lajean Manes M.D.   On: 09/18/2014 14:55     EKG Interpretation   Date/Time:  Friday September 18 2014 13:02:37 EDT Ventricular Rate:  120 PR Interval:  160 QRS Duration: 72 QT Interval:  316 QTC Calculation: 446 R Axis:   0 Text Interpretation:  Sinus tachycardia When compared with ECG of 08/07/2012  Diffuse Nonspecific ST and T wave abnormality is no longer Present  Confirmed by [facility]  MD, KATHLEEN [phone]) on 09/18/2014 1:17:07 PM       MDM   Final diagnoses:  Tachycardia  Panic attack    Filed Vitals:   09/18/14 1255 09/18/14 1330 09/18/14 1345 09/18/14 1428  BP: 165/92 147/92 152/65 155/97  Pulse: 111 99 96 90  Temp: 98.3 F (36.8 C)     TempSrc: Oral     Resp: 18 13 14 18   Height: 5\' 5"  (1.651 m)     Weight: 194 lb 6.4 oz (88.179 kg)     SpO2: 97% 97% 98% 99%     Jodi Wade is a pleasant 54 y.o. female presenting with tachycardia, lightheaded sensation, tingling in the bilateral  upper extremities. This is consistent with her prior panic attacks. EKG is nonischemic, troponin is negative, no anemia seen on CBC. Patient's chest x-ray is also negative, she reports spontaneous improvement of all symptoms after she relaxed. Will Rx Atarax when necessary. Advised to follow closely with primary care.  Evaluation does not show pathology that would require ongoing emergent intervention or inpatient treatment. Pt is hemodynamically stable and mentating appropriately. Discussed findings and plan with patient/guardian, who agrees with care plan. All questions answered. Return precautions discussed and outpatient follow up given.   New Prescriptions   HYDROXYZINE (ATARAX/VISTARIL) 25 MG TABLET    Take 1 tablet (25 mg total) by mouth every 6 (six) hours as needed.         Elmyra Ricks  [place], PA-C 09/18/14 [place], DO 09/19/14 [phone]

## 2014-11-12 ENCOUNTER — Ambulatory Visit: Payer: 59

## 2014-11-12 ENCOUNTER — Other Ambulatory Visit: Payer: Self-pay

## 2014-11-12 VITALS — BP 118/80 | HR 72 | Ht 64.0 in | Wt 187.8 lb

## 2014-11-12 DIAGNOSIS — E119 Type 2 diabetes mellitus without complications: Secondary | ICD-10-CM

## 2014-11-12 LAB — POCT GLYCOSYLATED HEMOGLOBIN (HGB A1C): Hemoglobin A1C: 5.9

## 2014-11-12 NOTE — Patient Outreach (Signed)
[facility]) Care Management   11/12/2014  Jodi Wade Sep 29, 1960 [id number]  Jodi Wade is an 54 y.o. female.   Member seen for follow up office visit for Link to Wellness program for self management of Type 2 diabetes  Subjective:  Member states she saw her MD a few weeks ago and she is concerned that her hemoglobin A1C had gone up to 7.5 at their office fingerstick.  States she has been watching her diet very closely and she has lost more weight.  States her blood sugars have not been over 130 for the last month or so.  States she is exercising at the gym 3 times a week for at least 30 minutes.  States she is taking her medication as ordered.    Objective:   Review of Systems  All other systems reviewed and are negative. POC Hemoglobin A1C- 5.9  Physical Exam  Filed Vitals:   11/12/14 1614  BP: 118/80  Pulse: 72   Filed Weights   11/12/14 1614  Weight: 187 lb 12.8 oz (85.186 kg)    Current Medications:   Current Outpatient Prescriptions  Medication Sig Dispense Refill  . amLODipine (NORVASC) 10 MG tablet Take 10 mg by mouth daily.    Marland Kitchen levothyroxine (SYNTHROID, LEVOTHROID) 100 MCG tablet Take 100 mcg by mouth daily.    Marland Kitchen lisinopril (PRINIVIL,ZESTRIL) 20 MG tablet Take 20 mg by mouth daily.    . metFORMIN (GLUCOPHAGE) 500 MG tablet Take 500 mg by mouth daily with breakfast.    . cyclobenzaprine (FLEXERIL) 5 MG tablet One tablet up to every 8 hours as needed for muscle relaxant. (Patient not taking: Reported on 11/12/2014) 20 tablet 0  . HYDROcodone-acetaminophen (NORCO) 7.5-325 MG per tablet Take 1 tablet by mouth every 4 (four) hours as needed for pain. (Patient not taking: Reported on 11/12/2014) 16 tablet 0  . hydrOXYzine (ATARAX/VISTARIL) 25 MG tablet Take 1 tablet (25 mg total) by mouth every 6 (six) hours as needed. (Patient not taking: Reported on 11/12/2014) 12 tablet 0  . omeprazole (PRILOSEC) 20 MG capsule Take 1 capsule (20 mg total) by mouth daily.  (Patient not taking: Reported on 11/12/2014) 10 capsule 0  . predniSONE (DELTASONE) 20 MG tablet Take 3 tabs po on first day, 2 tabs second day, 2 tabs third day, 1 tab fourth day, 1 tab 5th day. Take with food. (Patient not taking: Reported on 11/12/2014) 9 tablet 0  . Vitamin D, Ergocalciferol, (DRISDOL) 50000 UNITS CAPS Take 50,000 Units by mouth every 7 (seven) days. thursday     No current facility-administered medications for this visit.    Functional Status:   In your present state of health, do you have any difficulty performing the following activities: 11/12/2014  Hearing? N  Vision? N  Difficulty concentrating or making decisions? N  Walking or climbing stairs? N  Dressing or bathing? N  Doing errands, shopping? N    Fall/Depression Screening:    PHQ 2/9 Scores 11/12/2014 05/11/2014  PHQ - 2 Score 0 0   THN CM Care Plan Problem One        Patient Outreach from 11/12/2014 in [place] Problem One  Potential for elevated bood sugars   Care Plan for Problem One  Active   THN Long Term Goal (31-90 days)  Member will maintain hemoglobin A1C at or below 7 for the next 90 days   THN Long Term Goal Start Date  11/12/14  Interventions for Problem One Long Term Goal  Reinforced to follow low CHO diet and to monitor portion sizes, Discussed ways to handle cookouts and eating out, Reinforced to continue regular exercise,  rechecked POC hemoglobin  A1c at members request       Assessment:  Member seen for initial office visit for Link to Wellness program for self management of Type 2 diabetes.  Member's hemoglobin A1c rechecked at members request with result of 5.9 which is more reflective of her CBG readings and weight loss.  Member has lost 11 lb since last Link to Wellness visit and total of 25.4 lb since December.  Reports exercising 3 times a week.  CBGs reviewed with ranges of 80-93 fasting and 120-130 post prandial.   Plan:  Plan to eat 30-45 GM (2-3) servings  of carbohydrate a meal and 15 GM for snacks.  Plan to eat protein with snacks Plan to check blood sugar once a day either fasting or 1 -2hrs after a meal.  Goals of 80-130 fasting and less than 180 after meal Plan to continue to go to gym 3 times a week for 30-45 minutes.  Goal of 150 minutes a week Plan to return to Link to Wellness in February 16, 2015 at [place] office   Peter Garter RN, [facility] Care Management Coordinator-Link to [place] Management [phone]

## 2014-11-13 NOTE — Patient Instructions (Signed)
1. Plan to eat 30-45 GM (2-3) servings of carbohydrate a meal and 15 GM for snacks.  Plan to eat protein with snacks 2. Plan to check blood sugar once a day either fasting or 1 -2hrs after a meal.  Goals of 80-130 fasting and less than 180 after meal 3. Plan to continue to go to gym 3 times a week for 30-45 minutes.  Goal of 150 minutes a week 4. Plan to return to Link to Wellness in February 16, 2015 at [place] office

## 2014-11-27 ENCOUNTER — Ambulatory Visit: Payer: 59

## 2014-11-27 ENCOUNTER — Ambulatory Visit
Admission: RE | Admit: 2014-11-27 | Discharge: 2014-11-27 | Disposition: A | Discharge status: Home / Self Care | Payer: 59 | Source: Ambulatory Visit

## 2014-11-27 DIAGNOSIS — Z1231 Encounter for screening mammogram for malignant neoplasm of breast: Secondary | ICD-10-CM

## 2014-11-27 DIAGNOSIS — Z803 Family history of malignant neoplasm of breast: Secondary | ICD-10-CM

## 2014-11-30 ENCOUNTER — Other Ambulatory Visit: Payer: Self-pay

## 2015-02-16 ENCOUNTER — Ambulatory Visit: Payer: 59

## 2015-03-30 ENCOUNTER — Other Ambulatory Visit: Payer: Self-pay

## 2015-03-30 VITALS — BP 128/88 | HR 80 | Resp 14 | Ht 64.0 in | Wt 183.2 lb

## 2015-03-30 DIAGNOSIS — E119 Type 2 diabetes mellitus without complications: Secondary | ICD-10-CM

## 2015-03-30 NOTE — Patient Instructions (Signed)
1. Plan to eat 30-45 GM (2-3) servings of carbohydrate a meal and 15 GM for snacks.  Plan to eat protein with snacks 2. Plan to check blood sugar 1-2 times a week either fasting or 1 -2hrs after a meal.  Goals of 80-130 fasting and less than 180 after meal 3. Plan to continue to go to gym 3 times a week for 30-45 minutes.  Goal of 150 minutes a week 4. Plan to return to Link to Wellness in 07/15/15 at [place] office 3:30PM

## 2015-03-30 NOTE — Patient Outreach (Signed)
[facility]) Care Management   03/30/2015  Jodi Wade [birth year] [id number]  Jodi Wade is an 54 y.o. female.   Member seen for follow up office visit for Link to Wellness program for self management of Type 2 diabetes  Subjective: Member states she is following her low CHO diet and she is exercising 3-4 times a week.  States that she saw her MD last month and he did not change anything.  States she did have an eye exam and she went to the dentist.    Objective:   Review of Systems  All other systems reviewed and are negative. Reviewed glucometer 7 day average-98 14 day average-96 30 day average-97 POC hemoglobin A1C 6.3  Physical Exam  Today's Vitals   03/30/15 1552  BP: 128/88  Pulse: 80  Resp: 14  Height: 1.626 m (5\' 4" )  Weight: 183 lb 3.2 oz (83.099 kg)  SpO2: 99%  PainSc: 0-No pain    Current Medications:   Current Outpatient Prescriptions  Medication Sig Dispense Refill  . amLODipine (NORVASC) 10 MG tablet Take 10 mg by mouth daily.    Marland Kitchen gabapentin (NEURONTIN) 100 MG capsule Take 100 mg by mouth 2 (two) times daily as needed.    Marland Kitchen levothyroxine (SYNTHROID, LEVOTHROID) 100 MCG tablet Take 100 mcg by mouth daily.    Marland Kitchen lisinopril (PRINIVIL,ZESTRIL) 20 MG tablet Take 20 mg by mouth daily.    . metFORMIN (GLUCOPHAGE) 500 MG tablet Take 500 mg by mouth daily with breakfast.    . cyclobenzaprine (FLEXERIL) 5 MG tablet One tablet up to every 8 hours as needed for muscle relaxant. (Patient not taking: Reported on 11/12/2014) 20 tablet 0  . HYDROcodone-acetaminophen (NORCO) 7.5-325 MG per tablet Take 1 tablet by mouth every 4 (four) hours as needed for pain. (Patient not taking: Reported on 11/12/2014) 16 tablet 0  . hydrOXYzine (ATARAX/VISTARIL) 25 MG tablet Take 1 tablet (25 mg total) by mouth every 6 (six) hours as needed. (Patient not taking: Reported on 11/12/2014) 12 tablet 0  . omeprazole (PRILOSEC) 20 MG capsule Take 1 capsule (20 mg total)  by mouth daily. (Patient not taking: Reported on 11/12/2014) 10 capsule 0  . predniSONE (DELTASONE) 20 MG tablet Take 3 tabs po on first day, 2 tabs second day, 2 tabs third day, 1 tab fourth day, 1 tab 5th day. Take with food. (Patient not taking: Reported on 11/12/2014) 9 tablet 0  . Vitamin D, Ergocalciferol, (DRISDOL) 50000 UNITS CAPS Take 50,000 Units by mouth every 7 (seven) days. thursday     No current facility-administered medications for this visit.    Functional Status:   In your present state of health, do you have any difficulty performing the following activities: 03/30/2015 11/12/2014  Hearing? N N  Vision? N N  Difficulty concentrating or making decisions? N N  Walking or climbing stairs? N N  Dressing or bathing? N N  Doing errands, shopping? N N    Fall/Depression Screening:    PHQ 2/9 Scores 03/30/2015 11/12/2014 05/11/2014  PHQ - 2 Score 0 0 0    Assessment:   Member seen for follow up office visit for Link to Wellness program for self management of Type 2 diabetes.  Member is meeting diabetes self management goal with last hemoglobin A1C of 6.3.  Member reports adhering to low CHO diet and exercises regularly.  CBGs range 79-103 in AM.  Member up to date with dilated eye exam and dental care.  Plan:  Plan to eat 30-45 GM (2-3) servings of carbohydrate a meal and 15 GM for snacks.  Plan to eat protein with snacks Plan to check blood sugar 1-2 times a week either fasting or 1 -2hrs after a meal.  Goals of 80-130 fasting and less than 180 after meal Plan to continue to go to gym 3 times a week for 30-45 minutes.  Goal of 150 minutes a week Plan to return to Link to Wellness in 07/15/15 at [place] office 3:30PM [place] Problem One        Most Recent Value   Care Plan Problem One  Potential for elevated bood sugars   Role Documenting the Problem One  Care Management [place] for Problem One  Active   THN Long Term Goal (31-90 days)  Member  will maintain hemoglobin A1C at or below 7 for the next 90 days   THN Long Term Goal Start Date  03/30/15 [facility] maintains hemoglobin A1C below 6.5]   Interventions for Problem One Long Term Goal  Reinforced to follow low CHO diet and to monitor portion sizes, Reinforced to continue regular exercise,  Instructed on foot care, Reviewed s/s of hypoglycemia and how to treat    Jodi Garter RN, [facility] Care Management Coordinator-Link to [place] Management [phone]

## 2015-03-31 LAB — POCT GLYCOSYLATED HEMOGLOBIN (HGB A1C): HEMOGLOBIN A1C: 6.3

## 2015-04-22 ENCOUNTER — Ambulatory Visit: Payer: 59 | Admitting: Podiatry

## 2015-05-10 ENCOUNTER — Ambulatory Visit (INDEPENDENT_AMBULATORY_CARE_PROVIDER_SITE_OTHER): Payer: 59 | Admitting: Podiatry

## 2015-05-10 ENCOUNTER — Encounter: Payer: Self-pay | Admitting: Podiatry

## 2015-05-10 VITALS — BP 134/81 | HR 68 | Resp 16 | Ht 63.0 in | Wt 175.0 lb

## 2015-05-10 DIAGNOSIS — B372 Candidiasis of skin and nail: Secondary | ICD-10-CM | POA: Diagnosis not present

## 2015-05-10 MED ORDER — TERBINAFINE HCL 250 MG PO TABS: ORAL_TABLET | ORAL | Status: DC

## 2015-05-10 NOTE — Progress Notes (Signed)
   Subjective:    Patient ID: Jodi Wade, female    DOB: 07/11/1960, 54 y.o.   MRN: [id number]  HPI Patient presents with a callous on their right foot, heel-medial side. x6 months   Review of Systems  All other systems reviewed and are negative.      Objective:   Physical Exam        Assessment & Plan:

## 2015-05-11 NOTE — Progress Notes (Signed)
Subjective:     Patient ID: Jodi Wade, female   DOB: [birth year], 54 y.o.   MRN: [id number]  HPI patient presents with a spot on the right heel of 6 months duration and that gets itchy and she has tried some topical medicines without relief and also presents with nail disease bilateral   Review of Systems  All other systems reviewed and are negative.      Objective:   Physical Exam  Constitutional: She is oriented to person, place, and time.  Cardiovascular: Intact distal pulses.   Musculoskeletal: Normal range of motion.  Neurological: She is oriented to person, place, and time.  Skin: Skin is warm.  Nursing note and vitals reviewed.  neurovascular status found to be intact muscle strength adequate range of motion within normal limits with patient found to have a spot on the right medial heel that measures about 2 x 2 centimeter and is roughened it's appearance with no drainage noted. Also noted to have nail disease 1-5 both feet     Assessment:     Lesion that may be fungal in its orientation or possible eczema and mycotic nail infections    Plan:     Reviewed both conditions and at this time we'll place her on oral pulse antifungal therapy consisting of Lamisil and instructed on cortisone cream for the lesion. If it does not improve she will see a dermatologist

## 2015-05-20 ENCOUNTER — Other Ambulatory Visit: Payer: Self-pay | Admitting: Internal Medicine

## 2015-05-20 DIAGNOSIS — E2839 Other primary ovarian failure: Secondary | ICD-10-CM

## 2015-06-07 MED FILL — GABAPENTIN 100 MG CAPSULE: 100 | 30 days supply | Qty: 60 | Fill #1

## 2015-06-23 ENCOUNTER — Other Ambulatory Visit: Payer: Self-pay

## 2015-06-23 DIAGNOSIS — Z1231 Encounter for screening mammogram for malignant neoplasm of breast: Secondary | ICD-10-CM

## 2015-06-25 ENCOUNTER — Inpatient Hospital Stay: Admission: RE | Admit: 2015-06-25 | Payer: 59 | Source: Ambulatory Visit

## 2015-07-15 ENCOUNTER — Other Ambulatory Visit: Payer: Self-pay

## 2015-07-15 VITALS — BP 138/88 | HR 68 | Resp 16 | Ht 64.0 in | Wt 176.0 lb

## 2015-07-15 DIAGNOSIS — E119 Type 2 diabetes mellitus without complications: Secondary | ICD-10-CM

## 2015-07-15 NOTE — Patient Instructions (Signed)
1. Plan to eat 30-45 GM (2-3) servings of carbohydrate a meal and 15 GM for snacks.  Plan to eat protein with snacks 2. Plan to check blood sugar 1-2 times a week either fasting or 1 -2hrs after a meal.  Goals of 80-130 fasting and less than 180 after meal 3. Plan to continue to go to gym one a week for 30-45 minutes and Zumba classes twice a week.  Goal of 150 minutes a week 4. Plan to see Dr. Sabra Heck on 08/26/15 5. Plan to return to Link to Wellness in 10/14/15 at 3:30PM

## 2015-07-15 NOTE — Patient Outreach (Signed)
[facility] [place]) Care Management   07/15/2015  Jodi Wade May 02, 1961 [id number]  Jodi Wade is an 55 y.o. female.   Member seen for follow up office visit for Link to Wellness program for self management of Type 2 diabetes  Subjective: Member states she is still losing weight but she is worried that she is losing too much.  States she saw her MD in December.  States she is going to change her primary care provider and she is to see her on 08/26/15.  States she is still eating smaller portions and watching her CHOs.  States she is going to [employer] class twice a week and the gym at least once a week.  States her blood sugars have been good with her highest reading of 117.    Objective:   Review of Systems  All other systems reviewed and are negative.   Physical Exam  Today's Vitals   07/15/15 1545  BP: 138/88  Pulse: 68  Resp: 16  Height: 1.626 m (5\' 4" )  Weight: 176 lb (79.833 kg)  SpO2: 99%  PainSc: 0-No pain    Current Medications:   Current Outpatient Prescriptions  Medication Sig Dispense Refill  . amLODipine (NORVASC) 10 MG tablet Take 10 mg by mouth daily.    Marland Kitchen gabapentin (NEURONTIN) 100 MG capsule Take 100 mg by mouth 2 (two) times daily as needed.    Marland Kitchen lisinopril (PRINIVIL,ZESTRIL) 20 MG tablet Take 20 mg by mouth daily.    . metFORMIN (GLUCOPHAGE) 500 MG tablet Take 500 mg by mouth daily with breakfast.    . terbinafine (LAMISIL) 250 MG tablet Take one tablet once daily x 7 days, then repeat every month x 4 months 28 tablet 0  . cyclobenzaprine (FLEXERIL) 5 MG tablet One tablet up to every 8 hours as needed for muscle relaxant. (Patient not taking: Reported on 07/15/2015) 20 tablet 0  . hydrOXYzine (ATARAX/VISTARIL) 25 MG tablet Take 1 tablet (25 mg total) by mouth every 6 (six) hours as needed. (Patient not taking: Reported on 07/15/2015) 12 tablet 0  . levothyroxine (SYNTHROID, LEVOTHROID) 100 MCG tablet Take 100 mcg by mouth daily.    Marland Kitchen omeprazole  (PRILOSEC) 20 MG capsule Take 1 capsule (20 mg total) by mouth daily. (Patient not taking: Reported on 07/15/2015) 10 capsule 0  . Vitamin D, Ergocalciferol, (DRISDOL) 50000 UNITS CAPS Take 50,000 Units by mouth every 7 (seven) days. Reported on 07/15/2015     No current facility-administered medications for this visit.    Functional Status:   In your present state of health, do you have any difficulty performing the following activities: 07/15/2015 03/30/2015  Hearing? N N  Vision? N N  Difficulty concentrating or making decisions? N N  Walking or climbing stairs? N N  Dressing or bathing? N N  Doing errands, shopping? N N    Fall/Depression Screening:    PHQ 2/9 Scores 07/15/2015 03/30/2015 11/12/2014 05/11/2014  PHQ - 2 Score 0 0 0 0    Assessment:  Member seen for follow up office visit for Link to Wellness program for self management of Type 2 diabetes. Member is meeting diabetes self management goal with last hemoglobin A1C of 6.3. Member reports adhering to low CHO diet and exercises regularly. Member has lost an additional 7.2 Lbs since last visit and a total of 37.2 lbs since starting Link to [employer].  CBGs range 88-106 in AM. Member up to date with dilated eye exam and dental care.  Plan:  Plan to eat 30-45 GM (2-3) servings of carbohydrate a meal and 15 GM for snacks.  Plan to eat protein with snacks Plan to check blood sugar 1-2 times a week either fasting or 1 -2hrs after a meal.  Goals of 80-130 fasting and less than 180 after meal Plan to continue to go to gym one a week for 30-45 minutes and Zumba classes twice a week.  Goal of 150 minutes a week Plan to see Dr. Sabra Heck on 08/26/15 Plan to return to Link to Wellness in 10/14/15 at 3:30PM  [facility] CM Care Plan Problem One        Most Recent Value   Care Plan Problem One  Potential for elevated bood sugars   Role Documenting the Problem One  Care Management [place] for Problem One  Active   THN Long Term  Goal (31-90 days)  Member will maintain hemoglobin A1C at or below 7 for the next 90 days   THN Long Term Goal Start Date  07/15/15 [facility] maintains hemoglobin A1C below 6.5]   Interventions for Problem One Long Term Goal  Reinforced to follow low CHO diet and to monitor portion sizes, Praised for her weight loss, Instructed to keep appointment with her primary care provider on 08/26/15, Reinforced to continue regular exercise, Reviewed s/s of hypoglycemia and how to treat    Peter Garter RN, [facility] Care Management Coordinator-Link to [place] Management [phone]

## 2015-07-19 ENCOUNTER — Ambulatory Visit
Admission: RE | Admit: 2015-07-19 | Discharge: 2015-07-19 | Disposition: A | Discharge status: Home / Self Care | Payer: 59 | Source: Ambulatory Visit | Attending: Internal Medicine | Admitting: Internal Medicine

## 2015-07-19 DIAGNOSIS — M8589 Other specified disorders of bone density and structure, multiple sites: Secondary | ICD-10-CM | POA: Diagnosis not present

## 2015-07-19 DIAGNOSIS — E2839 Other primary ovarian failure: Secondary | ICD-10-CM

## 2015-07-22 MED FILL — LEVOTHYROXINE 100 MCG TAB: 100 | 90 days supply | Qty: 90 | Fill #1

## 2015-07-22 MED FILL — metFORMIN HCL 500 MG TABS: 500 | 90 days supply | Qty: 90 | Fill #1

## 2015-08-18 MED FILL — TERBINAFINE HCL 250 MG TAB: 250 | 28 days supply | Qty: 7 | Fill #1

## 2015-08-19 MED FILL — AMLODIPINE BESYLATE 10 MG T: 10 | 90 days supply | Qty: 90 | Fill #0

## 2015-08-19 MED FILL — LISINOPRIL 20 MG TABLET: 20 | 90 days supply | Qty: 90 | Fill #0

## 2015-08-27 DIAGNOSIS — E039 Hypothyroidism, unspecified: Secondary | ICD-10-CM | POA: Diagnosis not present

## 2015-08-27 DIAGNOSIS — I10 Essential (primary) hypertension: Secondary | ICD-10-CM | POA: Diagnosis not present

## 2015-08-27 DIAGNOSIS — E119 Type 2 diabetes mellitus without complications: Secondary | ICD-10-CM | POA: Diagnosis not present

## 2015-08-27 DIAGNOSIS — E6609 Other obesity due to excess calories: Secondary | ICD-10-CM | POA: Diagnosis not present

## 2015-08-30 MED FILL — LEVOTHYROXINE 75 MCG TABLET: 75 | 90 days supply | Qty: 90 | Fill #0

## 2015-10-14 ENCOUNTER — Other Ambulatory Visit: Payer: Self-pay

## 2015-10-14 ENCOUNTER — Ambulatory Visit: Payer: Self-pay

## 2015-10-14 VITALS — BP 132/80 | HR 70 | Resp 14 | Ht 64.0 in | Wt 176.0 lb

## 2015-10-14 DIAGNOSIS — E119 Type 2 diabetes mellitus without complications: Secondary | ICD-10-CM

## 2015-10-14 NOTE — Patient Outreach (Signed)
[facility]) Care Management   10/14/2015  Terah Devine Bobrowski 1960/12/07 [id number]  Jodi Wade is an 55 y.o. female.   Member seen for follow up office visit for Link to Wellness program for self management of Type 2 diabetes  Subjective: Member states she saw her new primary care provider in March and she liked her.  States that her hemoglobin A1C was down to 5.6%.  States that her MD stopped her Metformin and told her she did not need to check her blood sugars.  States she is watching her diet and is exercising 3 times a week either a Zumba class or she goes to the gym.  Objective:   Review of Systems  All other systems reviewed and are negative.   Physical Exam Today's Vitals   10/14/15 1615  BP: 132/80  Pulse: 70  Resp: 14  Height: 1.626 m (5\' 4" )  Weight: 176 lb (79.833 kg)  SpO2: 99%  PainSc: 0-No pain   Encounter Medications:   Outpatient Encounter Prescriptions as of 10/14/2015  Medication Sig  . amLODipine (NORVASC) 10 MG tablet Take 10 mg by mouth daily.  Marland Kitchen levothyroxine (SYNTHROID, LEVOTHROID) 75 MCG tablet Take 75 mcg by mouth daily before breakfast.  . lisinopril (PRINIVIL,ZESTRIL) 20 MG tablet Take 20 mg by mouth daily.  . cyclobenzaprine (FLEXERIL) 5 MG tablet One tablet up to every 8 hours as needed for muscle relaxant. (Patient not taking: Reported on 07/15/2015)  . gabapentin (NEURONTIN) 100 MG capsule Take 100 mg by mouth 2 (two) times daily as needed. Reported on 10/14/2015  . hydrOXYzine (ATARAX/VISTARIL) 25 MG tablet Take 1 tablet (25 mg total) by mouth every 6 (six) hours as needed. (Patient not taking: Reported on 07/15/2015)  . levothyroxine (SYNTHROID, LEVOTHROID) 100 MCG tablet Take 100 mcg by mouth daily. Reported on 10/14/2015  . metFORMIN (GLUCOPHAGE) 500 MG tablet Take 500 mg by mouth daily with breakfast. Reported on 10/14/2015  . omeprazole (PRILOSEC) 20 MG capsule Take 1 capsule (20 mg total) by mouth daily. (Patient not taking:  Reported on 07/15/2015)  . terbinafine (LAMISIL) 250 MG tablet Take one tablet once daily x 7 days, then repeat every month x 4 months (Patient not taking: Reported on 10/14/2015)  . Vitamin D, Ergocalciferol, (DRISDOL) 50000 UNITS CAPS Take 50,000 Units by mouth every 7 (seven) days. Reported on 10/14/2015   No facility-administered encounter medications on file as of 10/14/2015.    Functional Status:   In your present state of health, do you have any difficulty performing the following activities: 10/14/2015 07/15/2015  Hearing? N N  Vision? N N  Difficulty concentrating or making decisions? N N  Walking or climbing stairs? N N  Dressing or bathing? N N  Doing errands, shopping? N N    Fall/Depression Screening:    PHQ 2/9 Scores 10/14/2015 07/15/2015 03/30/2015 11/12/2014 05/11/2014  PHQ - 2 Score 0 0 0 0 0    Assessment:  Member seen for follow up office visit for Link to Wellness program for self management of Type 2 diabetes. Member is meeting diabetes self management goal with last hemoglobin A1C of 5.6. Member reports adhering to low CHO diet and exercises regularly. Member has seen new primary care provider. Her Metformin was stopped and was told to stop checking CBGs.  Weight is unchanged from last visit.  Member up to date with dilated eye exam and dental care  Plan:  Plan to eat 30-45 GM (2-3) servings of carbohydrate a meal  and 15 GM for snacks.  Plan to eat protein with snacks Plan to continue to go Zumba classes three times a week or go to gym for 30-45 minutes.  Goal of 150 minutes a week Plan to return to Link to Wellness in 01/13/16 at 4 PM  [facility] CM Care Plan Problem One        Most Recent Value   Care Plan Problem One  Potential for elevated bood sugars   Role Documenting the Problem One  Care Management Accord for Problem One  Active   THN Long Term Goal (31-90 days)  Member will maintain hemoglobin A1C at or below 7 for the next 90 days   THN Long Term  Goal Start Date  10/14/15 [Continue last hemoglobin A1C 5.6]   Interventions for Problem One Long Term Goal  Reinforced to follow low CHO diet and to monitor portion sizes, Discussed lipid levels and way to low her LDL, Praised for her weight loss,  Reinforced to continue regular exercise    Peter Garter RN, [facility] Care Management Coordinator-Link to [place] Management [phone]

## 2015-10-15 NOTE — Patient Instructions (Signed)
1. Plan to eat 30-45 GM (2-3) servings of carbohydrate a meal and 15 GM for snacks.  Plan to eat protein with snacks 2. Plan to continue to go Zumba classes three times a week or go to gym for 30-45 minutes.  Goal of 150 minutes a week 3. Plan to return to Link to Wellness in 01/13/16 at 4 PM

## 2015-10-20 MED FILL — CHLORHEXIDINE 0.12% RINSE: 0.12 | 16 days supply | Qty: 473 | Fill #0

## 2015-11-25 DIAGNOSIS — E119 Type 2 diabetes mellitus without complications: Secondary | ICD-10-CM | POA: Diagnosis not present

## 2015-11-25 DIAGNOSIS — I10 Essential (primary) hypertension: Secondary | ICD-10-CM | POA: Diagnosis not present

## 2015-11-25 DIAGNOSIS — E039 Hypothyroidism, unspecified: Secondary | ICD-10-CM | POA: Diagnosis not present

## 2015-11-25 DIAGNOSIS — D229 Melanocytic nevi, unspecified: Secondary | ICD-10-CM | POA: Diagnosis not present

## 2015-11-25 DIAGNOSIS — E7800 Pure hypercholesterolemia, unspecified: Secondary | ICD-10-CM | POA: Diagnosis not present

## 2015-11-30 MED FILL — ATORVASTATIN 10 MG TABLET: 10 | 90 days supply | Qty: 90 | Fill #0

## 2015-12-03 ENCOUNTER — Other Ambulatory Visit: Payer: Self-pay | Admitting: Family Medicine

## 2015-12-03 ENCOUNTER — Ambulatory Visit
Admission: RE | Admit: 2015-12-03 | Discharge: 2015-12-03 | Disposition: A | Discharge status: Home / Self Care | Payer: 59 | Source: Ambulatory Visit

## 2015-12-03 DIAGNOSIS — Z1231 Encounter for screening mammogram for malignant neoplasm of breast: Secondary | ICD-10-CM | POA: Diagnosis not present

## 2015-12-23 MED FILL — AMLODIPINE BESYLATE 10 MG T: 10 | 90 days supply | Qty: 90 | Fill #0

## 2015-12-23 MED FILL — LISINOPRIL 20 MG TABLET: 20 | 90 days supply | Qty: 90 | Fill #0

## 2016-01-13 ENCOUNTER — Other Ambulatory Visit: Payer: Self-pay

## 2016-01-13 VITALS — BP 120/84 | HR 68 | Resp 14 | Ht 64.0 in | Wt 183.4 lb

## 2016-01-13 DIAGNOSIS — E119 Type 2 diabetes mellitus without complications: Secondary | ICD-10-CM | POA: Insufficient documentation

## 2016-01-13 DIAGNOSIS — E785 Hyperlipidemia, unspecified: Secondary | ICD-10-CM | POA: Insufficient documentation

## 2016-01-13 DIAGNOSIS — E1169 Type 2 diabetes mellitus with other specified complication: Secondary | ICD-10-CM | POA: Insufficient documentation

## 2016-01-13 NOTE — Patient Outreach (Addendum)
[facility]) Care Management   01/13/2016  Jodi Wade Apr 25, 1961 [id number]  Jodi Wade is an 55 y.o. female.   Member seen for follow up office visit for Link to Wellness program for self management of Type 2 diabetes  Subjective: Member states that she continues to watch her portion sizes and exercises at least 3 times a week.  States she is to see her MD in September.  States that she started her on a pill for her cholesterol and she is taking without any problems.  States she is not checking her blood sugars as her MD told her she did not need to check.  Objective:   Review of Systems  All other systems reviewed and are negative.   Physical Exam Today's Vitals   01/13/16 1602  BP: 120/84  Pulse: 68  Resp: 14  SpO2: 97%  Weight: 183 lb 6.4 oz (83.2 kg)  Height: 1.626 m (5\' 4" )  PainSc: 0-No pain   Encounter Medications:   Outpatient Encounter Prescriptions as of 01/13/2016  Medication Sig  . amLODipine (NORVASC) 10 MG tablet Take 10 mg by mouth daily.  Marland Kitchen atorvastatin (LIPITOR) 10 MG tablet Take 10 mg by mouth daily.  Marland Kitchen lisinopril (PRINIVIL,ZESTRIL) 20 MG tablet Take 20 mg by mouth daily.  . cyclobenzaprine (FLEXERIL) 5 MG tablet One tablet up to every 8 hours as needed for muscle relaxant. (Patient not taking: Reported on 07/15/2015)  . gabapentin (NEURONTIN) 100 MG capsule Take 100 mg by mouth 2 (two) times daily as needed. Reported on 10/14/2015  . hydrOXYzine (ATARAX/VISTARIL) 25 MG tablet Take 1 tablet (25 mg total) by mouth every 6 (six) hours as needed. (Patient not taking: Reported on 07/15/2015)  . levothyroxine (SYNTHROID, LEVOTHROID) 100 MCG tablet Take 100 mcg by mouth daily. Reported on 10/14/2015  . levothyroxine (SYNTHROID, LEVOTHROID) 75 MCG tablet Take 75 mcg by mouth daily before breakfast.  . metFORMIN (GLUCOPHAGE) 500 MG tablet Take 500 mg by mouth daily with breakfast. Reported on 10/14/2015  . omeprazole (PRILOSEC) 20 MG capsule  Take 1 capsule (20 mg total) by mouth daily. (Patient not taking: Reported on 07/15/2015)  . terbinafine (LAMISIL) 250 MG tablet Take one tablet once daily x 7 days, then repeat every month x 4 months (Patient not taking: Reported on 10/14/2015)  . Vitamin D, Ergocalciferol, (DRISDOL) 50000 UNITS CAPS Take 50,000 Units by mouth every 7 (seven) days. Reported on 10/14/2015   No facility-administered encounter medications on file as of 01/13/2016.     Functional Status:   In your present state of health, do you have any difficulty performing the following activities: 01/13/2016 10/14/2015  Hearing? N N  Vision? N N  Difficulty concentrating or making decisions? N N  Walking or climbing stairs? N N  Dressing or bathing? N N  Doing errands, shopping? N N  Some recent data might be hidden    Fall/Depression Screening:    PHQ 2/9 Scores 01/13/2016 10/14/2015 07/15/2015 03/30/2015 11/12/2014 05/11/2014  PHQ - 2 Score 0 0 0 0 0 0    Assessment:   Member seen for follow up office visit for Link to Wellness program for self management of Type 2 diabetes. Member is meeting diabetes self management goal with last hemoglobin A1C of 5.6%. Member reports adhering to low CHO diet and exercises regularly. Member started on atorvastatin and is taking without complaints,  Member up to date with dilated eye exam and dental care  Plan:   Plan to  eat 30-45 GM (2-3) servings of carbohydrate a meal and 15 GM for snacks.  Plan to eat protein with snacks Plan to continue to go Zumba classes three times a week or go to gym for 30-45 minutes.  Goal of 150 minutes a week Plan to complete EMMI programs by 04/05/16    Plan to return to Link to Wellness in 05/11/16 at 4 PM  [facility] CM Care Plan Problem One   Flowsheet Row Most Recent Value  Care Plan Problem One  Potential for elevated bood sugars as evidenced by previous elevated hemoglobin A1C related to dx of Type 2 DM  Role Documenting the Problem One  Care Management  Coordinator  Care Plan for Problem One  Active  THN Long Term Goal (31-90 days)  Member will maintain hemoglobin A1C at or below 7 for the next 90 days  THN Long Term Goal Start Date  01/13/16 [Continue last hemoglobin A1C 5.6]  Interventions for Problem One Long Term Goal  Reinforced to follow low CHO diet and to monitor portion sizes, Discussed lipid levels and way to low her LDL,  Instructed on use of atorvastatin, Instructed to keep scheduled appt with MD in September,   Reinforced to continue regular exercise    Peter Garter RN, [facility] Care Management Coordinator-Link to [place] Management [phone]

## 2016-01-14 NOTE — Patient Instructions (Signed)
1. Plan to eat 30-45 GM (2-3) servings of carbohydrate a meal and 15 GM for snacks.  Plan to eat protein with snacks 2. Plan to continue to go Zumba classes three times a week or go to gym for 30-45 minutes.  Goal of 150 minutes a week 3. Plan to complete EMMI programs by 04/05/16 Plan to return to Link to Wellness in 05/11/16 at 4 PM

## 2016-02-02 MED FILL — CHLORHEXIDINE 0.12% RINSE: 0.12 | 16 days supply | Qty: 473 | Fill #1

## 2016-02-25 DIAGNOSIS — E7800 Pure hypercholesterolemia, unspecified: Secondary | ICD-10-CM | POA: Diagnosis not present

## 2016-02-25 DIAGNOSIS — E6609 Other obesity due to excess calories: Secondary | ICD-10-CM | POA: Diagnosis not present

## 2016-02-25 DIAGNOSIS — R221 Localized swelling, mass and lump, neck: Secondary | ICD-10-CM | POA: Diagnosis not present

## 2016-02-25 DIAGNOSIS — Z6831 Body mass index (BMI) 31.0-31.9, adult: Secondary | ICD-10-CM | POA: Diagnosis not present

## 2016-02-25 DIAGNOSIS — E039 Hypothyroidism, unspecified: Secondary | ICD-10-CM | POA: Diagnosis not present

## 2016-02-25 DIAGNOSIS — I10 Essential (primary) hypertension: Secondary | ICD-10-CM | POA: Diagnosis not present

## 2016-02-25 DIAGNOSIS — Z79899 Other long term (current) drug therapy: Secondary | ICD-10-CM | POA: Diagnosis not present

## 2016-02-25 MED FILL — ATORVASTATIN 10 MG TABLET: 10 | 90 days supply | Qty: 90 | Fill #0

## 2016-02-29 MED FILL — LEVOTHYROXINE 50 MCG TABLET: 50 | 90 days supply | Qty: 90 | Fill #0

## 2016-03-02 ENCOUNTER — Other Ambulatory Visit: Payer: Self-pay | Admitting: Family Medicine

## 2016-03-02 DIAGNOSIS — IMO0002 Reserved for concepts with insufficient information to code with codable children: Secondary | ICD-10-CM

## 2016-03-02 DIAGNOSIS — E039 Hypothyroidism, unspecified: Secondary | ICD-10-CM

## 2016-03-02 DIAGNOSIS — R229 Localized swelling, mass and lump, unspecified: Principal | ICD-10-CM

## 2016-03-09 ENCOUNTER — Ambulatory Visit
Admission: RE | Admit: 2016-03-09 | Discharge: 2016-03-09 | Disposition: A | Discharge status: Home / Self Care | Payer: 59 | Source: Ambulatory Visit | Attending: Family Medicine | Admitting: Family Medicine

## 2016-03-09 DIAGNOSIS — E042 Nontoxic multinodular goiter: Secondary | ICD-10-CM | POA: Diagnosis not present

## 2016-03-09 DIAGNOSIS — E039 Hypothyroidism, unspecified: Secondary | ICD-10-CM

## 2016-03-09 DIAGNOSIS — IMO0002 Reserved for concepts with insufficient information to code with codable children: Secondary | ICD-10-CM

## 2016-03-09 DIAGNOSIS — R229 Localized swelling, mass and lump, unspecified: Principal | ICD-10-CM

## 2016-03-17 ENCOUNTER — Other Ambulatory Visit: Payer: Self-pay | Admitting: Family Medicine

## 2016-03-17 DIAGNOSIS — E041 Nontoxic single thyroid nodule: Secondary | ICD-10-CM

## 2016-03-21 ENCOUNTER — Ambulatory Visit
Admission: RE | Admit: 2016-03-21 | Discharge: 2016-03-21 | Disposition: A | Discharge status: Home / Self Care | Payer: 59 | Source: Ambulatory Visit | Attending: Family Medicine | Admitting: Family Medicine

## 2016-03-21 ENCOUNTER — Other Ambulatory Visit (HOSPITAL_COMMUNITY)
Admission: RE | Admit: 2016-03-21 | Discharge: 2016-03-21 | Disposition: A | Discharge status: Home / Self Care | Payer: 59 | Source: Ambulatory Visit | Attending: Physician Assistant | Admitting: Physician Assistant

## 2016-03-21 DIAGNOSIS — E041 Nontoxic single thyroid nodule: Secondary | ICD-10-CM | POA: Diagnosis not present

## 2016-03-21 NOTE — Procedures (Signed)
  Using direct ultrasound guidance, 4 passes were made using 25 g needles into the nodule within the right lobe of the thyroid.   Ultrasound was used to confirm needle placements on all occasions.   Specimens were sent to Pathology for analysis.   Aliyana Dlugosz S Matyas Baisley PA-C 03/21/2016 1:51 PM

## 2016-03-23 DIAGNOSIS — J0100 Acute maxillary sinusitis, unspecified: Secondary | ICD-10-CM | POA: Diagnosis not present

## 2016-03-23 DIAGNOSIS — J309 Allergic rhinitis, unspecified: Secondary | ICD-10-CM | POA: Diagnosis not present

## 2016-03-23 DIAGNOSIS — H6122 Impacted cerumen, left ear: Secondary | ICD-10-CM | POA: Diagnosis not present

## 2016-03-24 MED FILL — AMOXICILLIN 500 MG CAPSULE: 500 | 10 days supply | Qty: 40 | Fill #0

## 2016-04-10 MED FILL — LISINOPRIL 20 MG TABLET: 20 | 90 days supply | Qty: 90 | Fill #1

## 2016-04-10 MED FILL — AMLODIPINE BESYLATE 10 MG T: 10 | 90 days supply | Qty: 90 | Fill #1

## 2016-05-11 ENCOUNTER — Ambulatory Visit: Payer: Self-pay

## 2016-06-01 MED FILL — LEVOTHYROXINE 50 MCG TABLET: 50 | 90 days supply | Qty: 90 | Fill #0

## 2016-06-02 DIAGNOSIS — E039 Hypothyroidism, unspecified: Secondary | ICD-10-CM | POA: Diagnosis not present

## 2016-06-26 MED FILL — ATORVASTATIN 10 MG TABLET: 10 | 90 days supply | Qty: 90 | Fill #1

## 2016-07-11 ENCOUNTER — Other Ambulatory Visit: Payer: Self-pay | Admitting: Family Medicine

## 2016-07-11 DIAGNOSIS — Z1231 Encounter for screening mammogram for malignant neoplasm of breast: Secondary | ICD-10-CM

## 2016-07-14 MED FILL — AMLODIPINE BESYLATE 10 MG T: 10 | 90 days supply | Qty: 90 | Fill #1

## 2016-07-14 MED FILL — LISINOPRIL 20 MG TABLET: 20 | 90 days supply | Qty: 90 | Fill #1

## 2016-07-18 ENCOUNTER — Ambulatory Visit: Payer: Self-pay

## 2016-08-11 DIAGNOSIS — E042 Nontoxic multinodular goiter: Secondary | ICD-10-CM | POA: Diagnosis not present

## 2016-08-11 DIAGNOSIS — E039 Hypothyroidism, unspecified: Secondary | ICD-10-CM | POA: Diagnosis not present

## 2016-08-15 ENCOUNTER — Ambulatory Visit: Payer: Self-pay

## 2016-08-25 DIAGNOSIS — I10 Essential (primary) hypertension: Secondary | ICD-10-CM | POA: Diagnosis not present

## 2016-08-25 DIAGNOSIS — Z1159 Encounter for screening for other viral diseases: Secondary | ICD-10-CM | POA: Diagnosis not present

## 2016-08-25 DIAGNOSIS — E041 Nontoxic single thyroid nodule: Secondary | ICD-10-CM | POA: Diagnosis not present

## 2016-08-25 DIAGNOSIS — E7800 Pure hypercholesterolemia, unspecified: Secondary | ICD-10-CM | POA: Diagnosis not present

## 2016-08-25 DIAGNOSIS — E039 Hypothyroidism, unspecified: Secondary | ICD-10-CM | POA: Diagnosis not present

## 2016-09-22 ENCOUNTER — Ambulatory Visit: Payer: Self-pay

## 2016-09-26 ENCOUNTER — Other Ambulatory Visit: Payer: Self-pay

## 2016-09-26 VITALS — BP 120/88 | HR 72 | Resp 16 | Ht 64.0 in | Wt 188.8 lb

## 2016-09-26 DIAGNOSIS — E119 Type 2 diabetes mellitus without complications: Secondary | ICD-10-CM

## 2016-09-26 NOTE — Patient Instructions (Signed)
1. Plan to eat 30-45 GM (2-3) servings of carbohydrate a meal and 15 GM for snacks.  Plan to eat protein with snacks 2. Plan to continue to go Zumba classes three times a week or go to gym for 30-45 minutes.  Goal of 150 minutes a week 3. Plan to follow up with MD in 6 months 4. Plan to return to Link to Wellness as needed

## 2016-09-26 NOTE — Patient Outreach (Signed)
[place] [facility]) Care Management   09/26/2016  Jodi Wade 03-Jul-1960 [id number]  Jodi Wade is an 56 y.o. female.   Member seen for follow up office visit for Link to Wellness program for self management of Type 2 diabetes  Subjective:  Member reported seeing Dr.Miller in March and states that her labs were good.  States they are trying to get her thyroid level regulated.  States she is going to [place] 3 times a week and she goes to [employer] a few times a week.  States she is still following a low CHO diet and trying to eat healthy.  Objective:   Review of Systems  All other systems reviewed and are negative.   Physical Exam Today's Vitals   09/26/16 1606 09/26/16 1616  BP: 120/88   Pulse: 72   Resp: 16   SpO2: 100%   Weight: 188 lb 12.8 oz (85.6 kg)   Height: 1.626 m ('5\' 4"' )   PainSc: 0-No pain 0-No pain   Encounter Medications:   Outpatient Encounter Prescriptions as of 09/26/2016  Medication Sig  . amLODipine (NORVASC) 10 MG tablet Take 10 mg by mouth daily.  Marland Kitchen atorvastatin (LIPITOR) 10 MG tablet Take 10 mg by mouth daily.  Marland Kitchen levothyroxine (SYNTHROID, LEVOTHROID) 50 MCG tablet Take 50 mcg by mouth daily before breakfast.  . lisinopril (PRINIVIL,ZESTRIL) 20 MG tablet Take 20 mg by mouth daily.  . cyclobenzaprine (FLEXERIL) 5 MG tablet One tablet up to every 8 hours as needed for muscle relaxant. (Patient not taking: Reported on 07/15/2015)  . gabapentin (NEURONTIN) 100 MG capsule Take 100 mg by mouth 2 (two) times daily as needed. Reported on 10/14/2015  . hydrOXYzine (ATARAX/VISTARIL) 25 MG tablet Take 1 tablet (25 mg total) by mouth every 6 (six) hours as needed. (Patient not taking: Reported on 07/15/2015)  . levothyroxine (SYNTHROID, LEVOTHROID) 100 MCG tablet Take 100 mcg by mouth daily. Reported on 10/14/2015  . levothyroxine (SYNTHROID, LEVOTHROID) 75 MCG tablet Take 75 mcg by mouth daily before breakfast.  . metFORMIN (GLUCOPHAGE) 500 MG  tablet Take 500 mg by mouth daily with breakfast. Reported on 10/14/2015  . omeprazole (PRILOSEC) 20 MG capsule Take 1 capsule (20 mg total) by mouth daily. (Patient not taking: Reported on 07/15/2015)  . terbinafine (LAMISIL) 250 MG tablet Take one tablet once daily x 7 days, then repeat every month x 4 months (Patient not taking: Reported on 10/14/2015)  . Vitamin D, Ergocalciferol, (DRISDOL) 50000 UNITS CAPS Take 50,000 Units by mouth every 7 (seven) days. Reported on 10/14/2015   No facility-administered encounter medications on file as of 09/26/2016.     Functional Status:   In your present state of health, do you have any difficulty performing the following activities: 09/26/2016 01/13/2016  Hearing? N N  Vision? N N  Difficulty concentrating or making decisions? N N  Walking or climbing stairs? N N  Dressing or bathing? N N  Doing errands, shopping? N N  Some recent data might be hidden    Fall/Depression Screening:    PHQ 2/9 Scores 09/26/2016 01/13/2016 10/14/2015 07/15/2015 03/30/2015 11/12/2014 05/11/2014  PHQ - 2 Score 0 0 0 0 0 0 0    Assessment:  Member seen for follow up office visit for Link to Wellness program for self management of Type 2 diabetes. Member is meeting diabetes self management goal with last hemoglobin A1C of 5.6%. Member reports adhering to low CHO diet and exercises regularly. Member not on medication for  diabetes and does not check blood sugars.   Member due for  dilated eye exam and dental care.  Plan to close case as member has achieved goals but can return to Link to Wellness in the future as needed.  Plan:   Plan to eat 30-45 GM (2-3) servings of carbohydrate a meal and 15 GM for snacks.  Plan to eat protein with snacks Plan to continue to go Zumba classes three times a week or go to gym for 30-45 minutes.  Goal of 150 minutes a week Plan to follow up with MD in 6 months Plan to return to Link to Wellness as needed  [place] Problem One     Most  Recent Value  Care Plan Problem One  Potential for elevated bood sugars as evidenced by previous elevated hemoglobin A1C related to dx of Type 2 DM  Role Documenting the Problem One  Care Management Coordinator  Care Plan for Problem One  Not Active  THN Long Term Goal (31-90 days)  Member will maintain hemoglobin A1C at or below 7 for the next 90 days  THN Long Term Goal Start Date  01/13/16 [Continue last hemoglobin A1C 5.6]  THN Long Term Goal Met Date  09/26/16  Interventions for Problem One Long Term Goal  Reinforced to follow low CHO diet and to monitor portion sizes,  Given EMMI printed education on hypothyroidism, Reinforced to continue regular exercise, Instructed that as she has achieved her goals and is no longer on diabetes medications that her case will be closed, Instructed to call Link to Wellness as needed if her needs change    Peter Garter RN, [facility] Care Management Coordinator-Link to [place] Management [phone]

## 2016-11-06 MED FILL — LISINOPRIL 20 MG TABLET: 20 | 90 days supply | Qty: 90 | Fill #0

## 2016-11-10 MED FILL — ATORVASTATIN 10 MG TABLET: 10 | 90 days supply | Qty: 90 | Fill #2

## 2016-11-15 MED FILL — AMLODIPINE BESYLATE 10 MG T: 10 | 90 days supply | Qty: 90 | Fill #0

## 2016-11-17 DIAGNOSIS — E039 Hypothyroidism, unspecified: Secondary | ICD-10-CM | POA: Diagnosis not present

## 2016-12-08 ENCOUNTER — Ambulatory Visit: Payer: 59

## 2016-12-15 ENCOUNTER — Ambulatory Visit
Admission: RE | Admit: 2016-12-15 | Discharge: 2016-12-15 | Disposition: A | Discharge status: Home / Self Care | Payer: 59 | Source: Ambulatory Visit | Attending: Family Medicine | Admitting: Family Medicine

## 2016-12-15 DIAGNOSIS — Z1231 Encounter for screening mammogram for malignant neoplasm of breast: Secondary | ICD-10-CM

## 2017-01-18 DIAGNOSIS — M25561 Pain in right knee: Secondary | ICD-10-CM | POA: Diagnosis not present

## 2017-01-18 DIAGNOSIS — M175 Other unilateral secondary osteoarthritis of knee: Secondary | ICD-10-CM | POA: Diagnosis not present

## 2017-01-24 MED FILL — MELOXICAM 15 MG TABLET: 15 | 15 days supply | Qty: 15 | Fill #0

## 2017-02-26 MED FILL — ATORVASTATIN 10 MG TABLET: 10 | 90 days supply | Qty: 90 | Fill #0

## 2017-02-26 MED FILL — AMLODIPINE BESYLATE 10 MG T: 10 | 90 days supply | Qty: 90 | Fill #0

## 2017-02-26 MED FILL — LISINOPRIL 20 MG TABS: 20 | 90 days supply | Qty: 90 | Fill #1

## 2017-03-16 DIAGNOSIS — E039 Hypothyroidism, unspecified: Secondary | ICD-10-CM | POA: Diagnosis not present

## 2017-03-16 DIAGNOSIS — E7800 Pure hypercholesterolemia, unspecified: Secondary | ICD-10-CM | POA: Diagnosis not present

## 2017-03-16 DIAGNOSIS — I10 Essential (primary) hypertension: Secondary | ICD-10-CM | POA: Diagnosis not present

## 2017-03-21 DIAGNOSIS — E039 Hypothyroidism, unspecified: Secondary | ICD-10-CM | POA: Diagnosis not present

## 2017-03-21 DIAGNOSIS — E7800 Pure hypercholesterolemia, unspecified: Secondary | ICD-10-CM | POA: Diagnosis not present

## 2017-03-21 DIAGNOSIS — I10 Essential (primary) hypertension: Secondary | ICD-10-CM | POA: Diagnosis not present

## 2017-04-03 ENCOUNTER — Ambulatory Visit (HOSPITAL_COMMUNITY)
Admission: EM | Admit: 2017-04-03 | Discharge: 2017-04-03 | Disposition: A | Discharge status: Home / Self Care | Payer: 59 | Attending: Family Medicine | Admitting: Family Medicine

## 2017-04-03 ENCOUNTER — Encounter (HOSPITAL_COMMUNITY): Payer: Self-pay | Admitting: Emergency Medicine

## 2017-04-03 DIAGNOSIS — B9789 Other viral agents as the cause of diseases classified elsewhere: Secondary | ICD-10-CM | POA: Diagnosis not present

## 2017-04-03 DIAGNOSIS — J069 Acute upper respiratory infection, unspecified: Secondary | ICD-10-CM | POA: Diagnosis not present

## 2017-04-03 MED ORDER — HYDROCODONE-HOMATROPINE 5-1.5 MG/5ML PO SYRP: 5.0000 mL | ORAL_SOLUTION | Freq: Four times a day (QID) | ORAL | 0 refills | Status: DC | PRN

## 2017-04-03 MED FILL — HYDROCODONE-HOMATROPINE SYR: 5-1.5 | 5 days supply | Qty: 90 | Fill #0

## 2017-04-03 NOTE — ED Triage Notes (Signed)
Pt c/o cold symptoms, nasal congestion, post nasal drip. Pt states she lost her voice.

## 2017-04-03 NOTE — ED Provider Notes (Signed)
  [place]   [id number] 04/03/17 Arrival Time: 3212  ASSESSMENT & PLAN:  1. Viral URI with cough     Meds ordered this encounter  Medications  . HYDROcodone-homatropine (HYCODAN) 5-1.5 MG/5ML syrup    Sig: Take 5 mLs by mouth every 6 (six) hours as needed for cough.    Dispense:  90 mL    Refill:  0   [place] Controlled Substances Registry consulted for this patient. I feel the risk/benefit ratio today is favorable for proceeding with this prescription for a controlled substance. Medication sedation precautions given.  OTC symptom care as needed. May f/u here as needed.  Reviewed expectations re: course of current medical issues. Questions answered. Outlined signs and symptoms indicating need for more acute intervention. Patient verbalized understanding. After Visit Summary given.   SUBJECTIVE:  Jodi Wade is a 56 y.o. female who presents with complaint of nasal congestion, post-nasal drainage, and a persistent cough. Onset abrupt approximately 3-4 days ago. Overall fatigued. SOB: none. Wheezing: none. Cough interfering with sleep. Normal PO intake. OTC treatment: None.  ROS: As per HPI.   OBJECTIVE:  Vitals:   04/03/17 1019  BP: 140/90  Pulse: 80  Resp: 16  Temp: 98.4 F (36.9 C)  TempSrc: Oral  SpO2: 98%     General appearance: alert; no distress HEENT: nasal congestion; clear runny nose; throat irritation secondary to post-nasal drainage Neck: supple without LAD Lungs: clear to auscultation bilaterally; active cough Skin: warm and dry Psychological: alert and cooperative; normal mood and affect  No results found.  Allergies  Allergen Reactions  . Fexofenadine-Pseudoephed Er     REACTION: fast heart rate  . Hydrochlorothiazide     REACTION: Hypokalemia  . Latex Hives  . Pseudoephedrine     REACTION: Heart Rate    Past Medical History:  Diagnosis Date  . Allergy   . Diabetes mellitus without complication ([place])   . Hypertension    . Thyroid disease    Social History   Social History  . Marital status: Married    Spouse name: N/A  . Number of children: N/A  . Years of education: N/A   Occupational History  . Not on file.   Social History Main Topics  . Smoking status: Never Smoker  . Smokeless tobacco: Never Used  . Alcohol use No  . Drug use: No  . Sexual activity: Not on file   Other Topics Concern  . Not on file   Social History Narrative  . No narrative on file   Family History  Problem Relation Age of Onset  . Cancer Mother   . Breast cancer Mother   . Diabetes Other   . Hypertension Other            Vanessa Kick, MD 04/03/17 [phone]

## 2017-04-06 DIAGNOSIS — M791 Myalgia, unspecified site: Secondary | ICD-10-CM | POA: Diagnosis not present

## 2017-04-06 DIAGNOSIS — E049 Nontoxic goiter, unspecified: Secondary | ICD-10-CM | POA: Diagnosis not present

## 2017-04-06 DIAGNOSIS — E0590 Thyrotoxicosis, unspecified without thyrotoxic crisis or storm: Secondary | ICD-10-CM | POA: Diagnosis not present

## 2017-04-06 DIAGNOSIS — J329 Chronic sinusitis, unspecified: Secondary | ICD-10-CM | POA: Diagnosis not present

## 2017-04-06 MED FILL — AZITHROMYCIN 250 MG TABLET: 250 | 5 days supply | Qty: 6 | Fill #0

## 2017-04-18 DIAGNOSIS — R079 Chest pain, unspecified: Secondary | ICD-10-CM | POA: Diagnosis not present

## 2017-06-07 MED FILL — LISINOPRIL 20 MG TABLET: 20 | 90 days supply | Qty: 90 | Fill #0

## 2017-06-07 MED FILL — AMLODIPINE BESYLATE 10 MG T: 10 | 90 days supply | Qty: 90 | Fill #0

## 2017-08-01 ENCOUNTER — Encounter: Payer: Self-pay | Admitting: Endocrinology

## 2017-08-01 ENCOUNTER — Ambulatory Visit (INDEPENDENT_AMBULATORY_CARE_PROVIDER_SITE_OTHER): Payer: Self-pay | Admitting: Endocrinology

## 2017-08-01 VITALS — BP 126/78 | HR 73 | Ht 64.0 in | Wt 205.6 lb

## 2017-08-01 DIAGNOSIS — E0520 Thyrotoxicosis with toxic multinodular goiter without thyrotoxic crisis or storm: Secondary | ICD-10-CM

## 2017-08-01 LAB — T3, FREE: T3 FREE: 4.4 pg/mL — AB (ref 2.3–4.2)

## 2017-08-01 LAB — TSH: TSH: 0.04 u[IU]/mL — AB (ref 0.35–4.50)

## 2017-08-01 LAB — T4, FREE: FREE T4: 0.72 ng/dL (ref 0.60–1.60)

## 2017-08-01 NOTE — Progress Notes (Signed)
Referring Physician: Kathyrn Lass  Reason for Appointment:  Evaluation of thyroid    History of Present Illness:   The patient is very unclear about her thyroid History  Review of her record indicates that she had a toxic nodular goiter in 2009 although she thinks her only symptom at that time was fatigue and she does not remember any other symptoms like palpitations or weight loss Her scan showed heterogenous uptake within the thyroid and a 24 uptake of 32% She was treated with 29 mCi After this treatment she thinks she felt better with her fatigue  At some point subsequently patient was started on levothyroxine and she does not know whether she was having any symptoms at that time Her record indicates that she has been in the past taking between 50 up to 100 g of levothyroxine until 2018  Detailed records are not available from the PCP at the time when her thyroid doses have been adjusted based on previous lab tests However she does appear to have a high TSH in 2017 and normal in 11/2016  Last year at least in the late part of the year she started gaining weight and has probably gained about 15- 20 pounds She does not think she has changed her diet much Recently not complaining of any unusual fatigue but has cold intolerance  With because of her TSH being low and subsequently T3 being high she has not been taking the thyroid supplements, she may have been told to stop taking these late last year but details are not available         Patient's weight history is as follows:  Wt Readings from Last 3 Encounters:  08/01/17 205 lb 9.6 oz (93.3 kg)  09/26/16 188 lb 12.8 oz (85.6 kg)  01/13/16 183 lb 6.4 oz (83.2 kg)    Thyroid function results have been as follows:  TSH in 11/2016 was 0.94, previously 0.02 in 3/18 and 7.9 in 02/2016  More recently TSH 0.07 in 03/2017 and in 04/2017 free T4 was 0.82 and free T3 4.8   No results found for: TSH, FREET4, T3FREE   Past  Medical History:  Diagnosis Date  . Allergy   . Diabetes mellitus without complication ([place])   . Hypertension   . Thyroid disease     Past Surgical History:  Procedure Laterality Date  . BREAST BIOPSY Right   . PARTIAL HYSTERECTOMY     abdominal    Family History  Problem Relation Age of Onset  . Cancer Mother   . Breast cancer Mother   . Diabetes Other   . Hypertension Other     Social History:  reports that  has never smoked. she has never used smokeless tobacco. She reports that she does not drink alcohol or use drugs.  Allergies:  Allergies  Allergen Reactions  . Fexofenadine-Pseudoephed Er     REACTION: fast heart rate  . Hydrochlorothiazide     REACTION: Hypokalemia  . Latex Hives  . Pseudoephedrine     REACTION: Heart Rate    Allergies as of 08/01/2017      Reactions   Fexofenadine-pseudoephed Er    REACTION: fast heart rate   Hydrochlorothiazide    REACTION: Hypokalemia   Latex Hives   Pseudoephedrine    REACTION: Heart Rate      Medication List        Accurate as of 08/01/17  4:02 PM. Always use your most recent med list.  amLODipine 10 MG tablet Commonly known as:  NORVASC Take 10 mg by mouth daily.   atorvastatin 10 MG tablet Commonly known as:  LIPITOR Take 10 mg by mouth daily.   levothyroxine 50 MCG tablet Commonly known as:  SYNTHROID, LEVOTHROID Take 50 mcg by mouth daily before breakfast.   levothyroxine 100 MCG tablet Commonly known as:  SYNTHROID, LEVOTHROID Take 100 mcg by mouth daily. Reported on 10/14/2015   levothyroxine 75 MCG tablet Commonly known as:  SYNTHROID, LEVOTHROID Take 75 mcg by mouth daily before breakfast.   lisinopril 20 MG tablet Commonly known as:  PRINIVIL,ZESTRIL Take 20 mg by mouth daily.          Review of Systems  Constitutional: Positive for weight gain.  HENT: Negative for hoarseness.   Cardiovascular: Negative for palpitations.  Gastrointestinal: Negative for constipation and  diarrhea.  Endocrine: Positive for cold intolerance. Negative for fatigue.  Genitourinary: Positive for nocturia.  Musculoskeletal: Negative for joint pain.  Skin: Negative for rash.  Neurological: Negative for tremors.  Psychiatric/Behavioral: Negative for insomnia.                Examination:    BP 126/78   Pulse 73   Ht 5\' 4"  (1.626 m)   Wt 205 lb 9.6 oz (93.3 kg)   SpO2 99%   BMI 35.29 kg/m   GENERAL:  Relatively large build.  Mild generalized obesity present  No pallor, clubbing, cervical lymphadenopathy or edema.    Skin:  no rash or pigmentation. Skin is not unusually warm  EYES:  No prominence of the eyes or swelling of the eyelids.  No lid lag  ENT: Oral mucosa and tongue normal.  THYROID: She has a firm medial left thyroid enlargement measuring about 2.5-3 cm. Left thyroid lobe is enlarged about 1-1/2 times normal and firm, slightly nodular  HEART:  Normal  S1 and S2; no murmur or click.  CHEST:    Lungs: Vescicular breath sounds heard equally.  No crepitations/ wheeze.  ABDOMEN:  No distention.  Liver and spleen not palpable.  No other mass or tenderness.  NEUROLOGICAL: No tremor present Reflexes are normal bilaterally at biceps and ankles.  JOINTS:  Normal.   Assessment:  History of TOXIC nodular goiter dating back to 2009 This was treated with I-131, 29 mCi  Although details are not available she apparently became hypothyroid after this treatment and has been on variable doses of levothyroxine in the past Since late 2018 she appears to have developed hyperthyroidism even though she has had only minimal symptoms  With her going off levothyroxine she continues to have mild T3 toxicosis as judged by her labs from PCP office Not clear why she has gained weight despite being mildly hyperthyroid Paradoxically she does complain of cold intolerance also  On exam she does appear to have a multinodular goiter but mostly palpable on the right which is a  residual of the previous goiter  PLAN:   Recheck thyroid panel including T3 to confirm that she has persistent hyperthyroidism Discussed that she does have a toxic nodular goiter or a hot nodule again we will need to treat her with I-131  Currently since she is not tachycardic will not start a beta-blocker as yet  Follow-up in about 6 weeks   Elayne Snare 08/01/2017, 4:02 PM   Consultation note copy sent to the PCP  Note: This office note was prepared with Dragon voice recognition system technology. Any transcriptional errors that result from this process  are unintentional.   ADDENDUM: She does have hyperthyroidism with suppressed TSH and a mildly increased free T3, will schedule I-131 uptake and scan

## 2017-08-16 ENCOUNTER — Ambulatory Visit (HOSPITAL_COMMUNITY)
Admission: RE | Admit: 2017-08-16 | Discharge: 2017-08-16 | Disposition: A | Discharge status: Home / Self Care | Payer: No Typology Code available for payment source | Source: Ambulatory Visit | Attending: Endocrinology | Admitting: Endocrinology

## 2017-08-16 DIAGNOSIS — E0520 Thyrotoxicosis with toxic multinodular goiter without thyrotoxic crisis or storm: Secondary | ICD-10-CM | POA: Insufficient documentation

## 2017-08-16 DIAGNOSIS — E041 Nontoxic single thyroid nodule: Secondary | ICD-10-CM | POA: Insufficient documentation

## 2017-08-16 MED ORDER — SODIUM IODIDE I 131 CAPSULE
12.5000 | Freq: Once | INTRAVENOUS | Status: AC | PRN
  Administered 2017-08-16: 12.5 via ORAL

## 2017-08-17 ENCOUNTER — Encounter (HOSPITAL_COMMUNITY)
Admission: RE | Admit: 2017-08-17 | Discharge: 2017-08-17 | Disposition: A | Discharge status: Home / Self Care | Payer: No Typology Code available for payment source | Source: Ambulatory Visit | Attending: Endocrinology | Admitting: Endocrinology

## 2017-08-17 MED ORDER — SODIUM PERTECHNETATE TC 99M INJECTION
10.0000 | Freq: Once | INTRAVENOUS | Status: AC | PRN
  Administered 2017-08-17: 10 via INTRAVENOUS

## 2017-08-22 ENCOUNTER — Other Ambulatory Visit: Payer: Self-pay | Admitting: Endocrinology

## 2017-08-22 DIAGNOSIS — E0520 Thyrotoxicosis with toxic multinodular goiter without thyrotoxic crisis or storm: Secondary | ICD-10-CM

## 2017-09-09 ENCOUNTER — Other Ambulatory Visit: Payer: Self-pay | Admitting: Endocrinology

## 2017-09-09 ENCOUNTER — Telehealth: Payer: Self-pay | Admitting: Endocrinology

## 2017-09-09 DIAGNOSIS — E0520 Thyrotoxicosis with toxic multinodular goiter without thyrotoxic crisis or storm: Secondary | ICD-10-CM

## 2017-09-09 NOTE — Telephone Encounter (Signed)
Need to have this week's visit canceled since she did not get her radioactive iodine treatment.  Please find out from nuclear medicine why she has not received her I-131 treatment.  I will need to see her 3 weeks after the treatment is done

## 2017-09-10 ENCOUNTER — Telehealth: Payer: Self-pay | Admitting: Endocrinology

## 2017-09-10 ENCOUNTER — Other Ambulatory Visit: Payer: Self-pay

## 2017-09-10 NOTE — Telephone Encounter (Signed)
Patient stated she was taking the levothyroxine and would like to know if she should stop this before her treatment on Friday And when she can resume this after treatment.   Please advise She would like [place] to send in a prescription for this over to the pharmacy      [place], [place] - [address] [facility].

## 2017-09-10 NOTE — Telephone Encounter (Signed)
I called pt- she is going to call [facility] and try to schedule RAI therapy for hyperthyroidism.

## 2017-09-10 NOTE — Telephone Encounter (Signed)
Please advise on directions for levothyroxine and what the dosage is so that this can be ordered for the patient

## 2017-09-10 NOTE — Telephone Encounter (Signed)
She was supposed to stop her levothyroxine anyway.  We need to cancel her radioactive iodine appointment on Friday and would need to move up her appointment here to about 3 weeks from now with labs.  Will evaluate need for the treatment after her next visit

## 2017-09-11 NOTE — Telephone Encounter (Signed)
Treatment will only be done if she has been off levothyroxine for 4 weeks and will retest thyroid levels since it has been quite some time since initial testing

## 2017-09-11 NOTE — Telephone Encounter (Signed)
Pt now states that she did not tell staff that she was still taking Levothyroxine. She states that she has not been on meds for the past 5-6 months. She would like to know if she can keep the appointment for Friday or if it needs to still be canceled and see you first.

## 2017-09-11 NOTE — Telephone Encounter (Signed)
I am getting a conflicting message.  If she has not taken levothyroxine for 5-6 months she can do the treatment .

## 2017-09-11 NOTE — Telephone Encounter (Signed)
Please advise as to why I am canceling Radioactive Iodine TX, as patient will ask.

## 2017-09-11 NOTE — Telephone Encounter (Signed)
Pt advised to keep her appointment as long as she has not been taking medication since the last 5-6 months. Pt verbalized understanding.

## 2017-09-13 ENCOUNTER — Ambulatory Visit: Payer: Self-pay | Admitting: Endocrinology

## 2017-09-14 ENCOUNTER — Ambulatory Visit (HOSPITAL_COMMUNITY)
Admission: RE | Admit: 2017-09-14 | Discharge: 2017-09-14 | Disposition: A | Discharge status: Home / Self Care | Payer: No Typology Code available for payment source | Source: Ambulatory Visit | Attending: Endocrinology | Admitting: Endocrinology

## 2017-09-14 DIAGNOSIS — E0520 Thyrotoxicosis with toxic multinodular goiter without thyrotoxic crisis or storm: Secondary | ICD-10-CM | POA: Insufficient documentation

## 2017-09-14 MED ORDER — SODIUM IODIDE I 131 CAPSULE
30.0000 | Freq: Once | INTRAVENOUS | Status: AC | PRN
  Administered 2017-09-14: 30 via ORAL

## 2017-10-03 MED FILL — AMLODIPINE BESYLATE 10 MG T: 10 | 90 days supply | Qty: 90 | Fill #0

## 2017-10-03 MED FILL — LISINOPRIL 20 MG TABLET: 20 | 90 days supply | Qty: 90 | Fill #0

## 2017-10-15 ENCOUNTER — Other Ambulatory Visit (INDEPENDENT_AMBULATORY_CARE_PROVIDER_SITE_OTHER): Payer: No Typology Code available for payment source

## 2017-10-15 DIAGNOSIS — E0520 Thyrotoxicosis with toxic multinodular goiter without thyrotoxic crisis or storm: Secondary | ICD-10-CM | POA: Diagnosis not present

## 2017-10-15 LAB — T4, FREE: FREE T4: 0.88 ng/dL (ref 0.60–1.60)

## 2017-10-15 LAB — T3, FREE: T3 FREE: 3.9 pg/mL (ref 2.3–4.2)

## 2017-10-15 LAB — TSH: TSH: 0.19 u[IU]/mL — AB (ref 0.35–4.50)

## 2017-10-18 ENCOUNTER — Ambulatory Visit (INDEPENDENT_AMBULATORY_CARE_PROVIDER_SITE_OTHER): Payer: No Typology Code available for payment source | Admitting: Endocrinology

## 2017-10-18 ENCOUNTER — Encounter: Payer: Self-pay | Admitting: Endocrinology

## 2017-10-18 VITALS — BP 132/86 | HR 84 | Ht 64.0 in | Wt 205.4 lb

## 2017-10-18 DIAGNOSIS — E0520 Thyrotoxicosis with toxic multinodular goiter without thyrotoxic crisis or storm: Secondary | ICD-10-CM

## 2017-10-18 NOTE — Progress Notes (Signed)
Referring Physician: Kathyrn Lass  Reason for Appointment:  Evaluation of thyroid    History of Present Illness:     Review of her record indicates that she had a toxic nodular goiter in 2009 although she thinks her only symptom at that time was fatigue and she does not remember any other symptoms like palpitations or weight loss Her scan showed heterogenous uptake within the thyroid and a 24 uptake of 32% She was treated with 29 mCi After this treatment she thinks she felt better with her fatigue  At some point subsequently patient was started on levothyroxine and she does not know whether she was having any symptoms at that time Her record indicates that she has been in the past taking between 50 up to 100 g of levothyroxine until 2018 However subsequently her thyroxine doses had been reduced and then tapered off because of her TSH being persistently low  Recent history:  Despite being off levothyroxine she had mild hypothyroidism when first seen in 07/2017 although having no significant symptoms except weight gain She was not having palpitations or heat intolerance  She had mild T3 toxicosis  Thyroid scan showed heterogenous uptake, 24 uptake was 37%  She had treatment with 30 mCi of I-131 on 09/14/2017  In the last month she does feel a little tired at times, no heat or cold intolerance Weight is about the same  Patient's weight history is as follows:  Wt Readings from Last 3 Encounters:  10/18/17 205 lb 6.4 oz (93.2 kg)  08/01/17 205 lb 9.6 oz (93.3 kg)  09/26/16 188 lb 12.8 oz (85.6 kg)    Thyroid function results have been as follows:  Outside labs: TSH in 11/2016 was 0.94, previously 0.02 in 3/18 and 7.9 in 02/2016  TSH 0.07 in 03/2017 and in 04/2017 free T4 was 0.82 and free T3 4.8   Lab Results  Component Value Date   TSH 0.19 (L) 10/15/2017   TSH 0.04 (L) 08/01/2017   FREET4 0.88 10/15/2017   FREET4 0.72 08/01/2017   T3FREE 3.9 10/15/2017   T3FREE 4.4 (H) 08/01/2017     Past Medical History:  Diagnosis Date  . Allergy   . Diabetes mellitus without complication ([place])   . Hypertension   . Thyroid disease     Past Surgical History:  Procedure Laterality Date  . BREAST BIOPSY Right   . PARTIAL HYSTERECTOMY     abdominal    Family History  Problem Relation Age of Onset  . Cancer Mother   . Breast cancer Mother   . Thyroid disease Mother   . Diabetes Other   . Hypertension Other   . Thyroid disease Maternal Aunt   . Thyroid disease Son        Hyperthyroid, not treated    Social History:  reports that she has never smoked. She has never used smokeless tobacco. She reports that she does not drink alcohol or use drugs.  Allergies:  Allergies  Allergen Reactions  . Fexofenadine-Pseudoephed Er     REACTION: fast heart rate  . Hydrochlorothiazide     REACTION: Hypokalemia  . Latex Hives  . Pseudoephedrine     REACTION: Heart Rate    Allergies as of 10/18/2017      Reactions   Fexofenadine-pseudoephed Er    REACTION: fast heart rate   Hydrochlorothiazide    REACTION: Hypokalemia   Latex Hives   Pseudoephedrine    REACTION: Heart Rate  Medication List        Accurate as of 10/18/17  1:33 PM. Always use your most recent med list.          amLODipine 10 MG tablet Commonly known as:  NORVASC Take 10 mg by mouth daily.   lisinopril 20 MG tablet Commonly known as:  PRINIVIL,ZESTRIL Take 20 mg by mouth daily.          Review of Systems              Examination:    BP 132/86 (BP Location: Left Arm, Patient Position: Sitting, Cuff Size: Normal)   Pulse 84   Ht 5\' 4"  (1.626 m)   Wt 205 lb 6.4 oz (93.2 kg)   SpO2 98%   BMI 35.26 kg/m   Right thyroid lobe is enlarged about twice normal and nodular Left thyroid lobe is enlarged about 1-1/2 times normal and firm, somewhat nodular  No tremor present Reflexes are normal at the right biceps Skin appears  normal   Assessment:  History of TOXIC nodular goiter dating back to 2009  She appears to have had a recurrence and she was treated with I-131, 30 mCi again in April 2019 with mild T3 toxicosis  She is subjectively not feeling any different although her thyroid levels are coming back to normal including T3 levels She still has a nodular goiter especially on the right which is unchanged on today's exam Clinically she does not appear hyperthyroid  PLAN:   Discussed that she may likely have hypothyroidism again once the overactive parts of her thyroid are controlled with I-131 treatment She was told to call if she develops any other typical hypothyroid symptoms before her next visit For now she does not need any treatment  Follow-up in about 6 weeks   Elayne Snare 10/18/2017, 1:33 PM      Note: This office note was prepared with Dragon voice recognition system technology. Any transcriptional errors that result from this process are unintentional.

## 2017-11-14 ENCOUNTER — Other Ambulatory Visit: Payer: Self-pay | Admitting: Family Medicine

## 2017-11-14 DIAGNOSIS — Z1231 Encounter for screening mammogram for malignant neoplasm of breast: Secondary | ICD-10-CM

## 2017-12-10 ENCOUNTER — Other Ambulatory Visit (INDEPENDENT_AMBULATORY_CARE_PROVIDER_SITE_OTHER): Payer: No Typology Code available for payment source

## 2017-12-10 DIAGNOSIS — E0520 Thyrotoxicosis with toxic multinodular goiter without thyrotoxic crisis or storm: Secondary | ICD-10-CM | POA: Diagnosis not present

## 2017-12-10 LAB — T3, FREE: T3, Free: 2.6 pg/mL (ref 2.3–4.2)

## 2017-12-10 LAB — T4, FREE: Free T4: 0.22 ng/dL — ABNORMAL LOW (ref 0.60–1.60)

## 2017-12-10 LAB — TSH: TSH: 63.84 u[IU]/mL — AB (ref 0.35–4.50)

## 2017-12-12 ENCOUNTER — Ambulatory Visit: Payer: No Typology Code available for payment source | Admitting: Endocrinology

## 2017-12-12 ENCOUNTER — Encounter: Payer: Self-pay | Admitting: Endocrinology

## 2017-12-12 VITALS — BP 140/80 | HR 71 | Ht 64.0 in | Wt 210.6 lb

## 2017-12-12 DIAGNOSIS — E890 Postprocedural hypothyroidism: Secondary | ICD-10-CM | POA: Diagnosis not present

## 2017-12-12 MED ORDER — LEVOTHYROXINE SODIUM 112 MCG PO TABS: 112.0000 ug | ORAL_TABLET | Freq: Every day | ORAL | 1 refills | Status: DC

## 2017-12-12 MED FILL — LEVOTHYROXINE 112 MCG TAB: 112 | 30 days supply | Qty: 30 | Fill #0

## 2017-12-12 NOTE — Progress Notes (Signed)
Referring Physician: Kathyrn Lass  Reason for Appointment: Follow-up of thyroid    History of Present Illness:     Review of her record indicates that she had a toxic nodular goiter in 2009 although she thinks her only symptom at that time was fatigue and she does not remember any other symptoms like palpitations or weight loss Her scan showed heterogenous uptake within the thyroid and a 24 uptake of 32% She was treated with 29 mCi After this treatment she thinks she felt better with her fatigue  At some point subsequently patient was started on levothyroxine and she does not know whether she was having any symptoms at that time Her record indicates that she has been in the past taking between 50 up to 100 g of levothyroxine until 2018 However subsequently her thyroxine doses had been reduced and then tapered off because of her TSH being persistently low  Recent history:  Despite being off levothyroxine she had mild hyperthyroidism when first seen in 07/2017 although having no symptoms except weight gain  She had mild T3 toxicosis  Thyroid scan showed heterogenous uptake, 24 uptake was 37%  She had treatment with 30 mCi of I-131 on 09/14/2017  Follow-up thyroid levels in 5/19 were normal with suppressed TSH  In the last 2 to 3 weeks she has had increasing fatigue Also feels unusually cold Her weight has gone up 5 pounds She is however currently also grieving for the death of her son  Currently however her TSH is gone up to 63.8  Patient's weight history is as follows:  Wt Readings from Last 3 Encounters:  12/12/17 210 lb 9.6 oz (95.5 kg)  10/18/17 205 lb 6.4 oz (93.2 kg)  08/01/17 205 lb 9.6 oz (93.3 kg)    Thyroid function results have been as follows:  Outside labs: TSH in 11/2016 was 0.94, previously 0.02 in 3/18 and 7.9 in 02/2016  TSH 0.07 in 03/2017 and in 04/2017 free T4 was 0.82 and free T3 4.8   Lab Results  Component Value Date   TSH 63.84 (H)  12/10/2017   TSH 0.19 (L) 10/15/2017   TSH 0.04 (L) 08/01/2017   FREET4 0.22 (L) 12/10/2017   FREET4 0.88 10/15/2017   FREET4 0.72 08/01/2017   T3FREE 2.6 12/10/2017   T3FREE 3.9 10/15/2017   T3FREE 4.4 (H) 08/01/2017     Past Medical History:  Diagnosis Date  . Allergy   . Diabetes mellitus without complication ([place])   . Hypertension   . Thyroid disease     Past Surgical History:  Procedure Laterality Date  . BREAST BIOPSY Right   . PARTIAL HYSTERECTOMY     abdominal    Family History  Problem Relation Age of Onset  . Cancer Mother   . Breast cancer Mother   . Thyroid disease Mother   . Diabetes Other   . Hypertension Other   . Thyroid disease Maternal Aunt   . Thyroid disease Son        Hyperthyroid, not treated    Social History:  reports that she has never smoked. She has never used smokeless tobacco. She reports that she does not drink alcohol or use drugs.  Allergies:  Allergies  Allergen Reactions  . Fexofenadine-Pseudoephed Er     REACTION: fast heart rate  . Hydrochlorothiazide     REACTION: Hypokalemia  . Latex Hives  . Pseudoephedrine     REACTION: Heart Rate    Allergies as of 12/12/2017  Reactions   Fexofenadine-pseudoephed Er    REACTION: fast heart rate   Hydrochlorothiazide    REACTION: Hypokalemia   Latex Hives   Pseudoephedrine    REACTION: Heart Rate      Medication List        Accurate as of 12/12/17  1:24 PM. Always use your most recent med list.          amLODipine 10 MG tablet Commonly known as:  NORVASC Take 10 mg by mouth daily.   lisinopril 20 MG tablet Commonly known as:  PRINIVIL,ZESTRIL Take 20 mg by mouth daily.          Review of Systems              Examination:    BP 140/80 (BP Location: Left Arm, Patient Position: Sitting, Cuff Size: Normal)   Pulse 71   Ht 5\' 4"  (1.626 m)   Wt 210 lb 9.6 oz (95.5 kg)   SpO2 98%   BMI 36.15 kg/m   She looks well Right thyroid lobe is enlarged about  2-2.5 times normal and nodular Left thyroid lobe is just palpable, about 1-1/2 times normal and firm No tremor Her skin appears normal, not unusually dry Right biceps reflex shows slow relaxation   Assessment:  History of TOXIC nodular goiter dating back to 2009  After her I-131 treatment in April 2019 she now has developed significant HYPOTHYROIDISM  She is complaining of fatigue and cold intolerance and has had a little weight gain However her goiter appears to be only slightly smaller on the left and the same on the right  Discussed that her hypothyroidism is fairly significant now with TSH over 60 and most likely she will need to be on levothyroxine supplementation lifelong  PLAN:   Levothyroxine 112 mcg daily to start with Discussed taking this in the morning before breakfast with water and avoiding any calcium or iron at the same time  Follow-up in about 6 weeks with repeat thyroid levels   Elayne Snare 12/12/2017, 1:24 PM      Note: This office note was prepared with Dragon voice recognition system technology. Any transcriptional errors that result from this process are unintentional.

## 2017-12-18 MED FILL — OMEPRAZOLE DR 40 MG CAPSULE: 40 | 30 days supply | Qty: 30 | Fill #0

## 2017-12-21 ENCOUNTER — Ambulatory Visit
Admission: RE | Admit: 2017-12-21 | Discharge: 2017-12-21 | Disposition: A | Discharge status: Home / Self Care | Payer: No Typology Code available for payment source | Source: Ambulatory Visit | Attending: Family Medicine | Admitting: Family Medicine

## 2017-12-21 DIAGNOSIS — Z1231 Encounter for screening mammogram for malignant neoplasm of breast: Secondary | ICD-10-CM

## 2018-01-11 MED FILL — ESOMEPRAZOLE MAG DR 40 MG C: 40 | 30 days supply | Qty: 30 | Fill #0

## 2018-01-14 MED FILL — OMEPRAZOLE 20 MG CAP: 20 | 14 days supply | Qty: 28 | Fill #0

## 2018-01-14 MED FILL — AMOXICILLIN 500 MG CAPSULE: 500 | 14 days supply | Qty: 56 | Fill #0

## 2018-01-14 MED FILL — CLARITHROMYCIN 500 MG TAB: 500 | 14 days supply | Qty: 28 | Fill #0

## 2018-01-18 MED FILL — LISINOPRIL 20 MG TABLET: 20 | 90 days supply | Qty: 90 | Fill #0

## 2018-01-18 MED FILL — AMLODIPINE BESYLATE 10 MG T: 10 | 90 days supply | Qty: 90 | Fill #0

## 2018-01-30 ENCOUNTER — Encounter: Payer: Self-pay | Admitting: Gastroenterology

## 2018-02-04 NOTE — Progress Notes (Signed)
Referring Physician: Kathyrn Lass  Reason for Appointment: Follow-up of thyroid    History of Present Illness:     Review of her record indicates that she had a toxic nodular goiter in 2009 although she thinks her only symptom at that time was fatigue and she does not remember any other symptoms like palpitations or weight loss Her scan showed heterogenous uptake within the thyroid and a 24 uptake of 32% She was treated with 29 mCi After this treatment she thinks she felt better with her fatigue  At some point subsequently patient was started on levothyroxine and she does not know whether she was having any symptoms at that time Her record indicates that she has been in the past taking between 50 up to 100 g of levothyroxine until 2018 However subsequently her thyroxine doses had been reduced and then tapered off because of her TSH being persistently low  Recent history:  Despite being off levothyroxine she had mild hyperthyroidism when first seen in 07/2017 although having no symptoms except weight gain She had mild T3 toxicosis  Thyroid scan showed heterogenous uptake, 24 uptake was 37%  She had treatment with 30 mCi of I-131 on 09/14/2017  Follow-up thyroid levels in 7/19 showed that TSH had gone up to 63.8  Previously she was complaining of significant fatigue, cold intolerance and had some weight gain  With thyroid supplement using 112 mcg levothyroxine since 7/19 she feels much better with her energy level She does feel a little cold Weight is down a couple of pounds  Patient's weight history is as follows:  Wt Readings from Last 3 Encounters:  02/05/18 208 lb (94.3 kg)  12/12/17 210 lb 9.6 oz (95.5 kg)  10/18/17 205 lb 6.4 oz (93.2 kg)    Thyroid function results have been as follows:  Outside labs: TSH in 11/2016 was 0.94, previously 0.02 in 3/18 and 7.9 in 02/2016  TSH 0.07 in 03/2017 and in 04/2017 free T4 was 0.82 and free T3 4.8   Lab Results    Component Value Date   TSH 63.84 (H) 12/10/2017   TSH 0.19 (L) 10/15/2017   TSH 0.04 (L) 08/01/2017   FREET4 0.22 (L) 12/10/2017   FREET4 0.88 10/15/2017   FREET4 0.72 08/01/2017   T3FREE 2.6 12/10/2017   T3FREE 3.9 10/15/2017   T3FREE 4.4 (H) 08/01/2017     Past Medical History:  Diagnosis Date  . Allergy   . Diabetes mellitus without complication ([place])   . Hypertension   . Thyroid disease     Past Surgical History:  Procedure Laterality Date  . BREAST BIOPSY Right   . PARTIAL HYSTERECTOMY     abdominal    Family History  Problem Relation Age of Onset  . Cancer Mother   . Breast cancer Mother   . Thyroid disease Mother   . Diabetes Other   . Hypertension Other   . Thyroid disease Maternal Aunt   . Thyroid disease Son        Hyperthyroid, not treated    Social History:  reports that she has never smoked. She has never used smokeless tobacco. She reports that she does not drink alcohol or use drugs.  Allergies:  Allergies  Allergen Reactions  . Fexofenadine-Pseudoephed Er     REACTION: fast heart rate  . Hydrochlorothiazide     REACTION: Hypokalemia  . Latex Hives  . Pseudoephedrine     REACTION: Heart Rate    Allergies as of 02/05/2018  Reactions   Fexofenadine-pseudoephed Er    REACTION: fast heart rate   Hydrochlorothiazide    REACTION: Hypokalemia   Latex Hives   Pseudoephedrine    REACTION: Heart Rate      Medication List        Accurate as of 02/05/18  8:37 AM. Always use your most recent med list.          amLODipine 10 MG tablet Commonly known as:  NORVASC Take 10 mg by mouth daily.   levothyroxine 112 MCG tablet Commonly known as:  SYNTHROID, LEVOTHROID Take 1 tablet (112 mcg total) by mouth daily before breakfast.   lisinopril 20 MG tablet Commonly known as:  PRINIVIL,ZESTRIL Take 20 mg by mouth daily.   simvastatin 10 MG tablet Commonly known as:  ZOCOR Take 10 mg by mouth daily.          Review of Systems       Is followed by her PCP for hypertension         Examination:    BP 124/72   Pulse 82   Ht 5\' 4"  (1.626 m)   Wt 208 lb (94.3 kg)   SpO2 97%   BMI 35.70 kg/m   No swelling of the eyes  Right thyroid lobe is enlarged about 2-2.5 times normal, firm and smooth  Left thyroid lobe is just palpable, about 1-1/2 times normal and firm Skin appears normal Biceps reflexes show normal relaxation Pulse is regular   Assessment:  History of TOXIC nodular goiter dating back to 2009 treated with I-131 twice including an 09/2017  Post ablative HYPOTHYROIDISM:  She had significant hypothyroidism on her last follow-up and now is on levothyroxine 112 mcg She is not having any further complaints of fatigue or weight gain Also her thyroid enlargement is small especially the left side She looks euthyroid on exam Again discussed that she will need to be on levothyroxine supplementation lifelong judging from the degree of hypothyroidism and she has  PLAN:   Thyroid levels to be checked today Discussed timing of taking the medication daily and regular follow-up She will need to be monitored periodically she needs short-term adjustment of the dosage we will see her back in 2 months  Continue follow-up with PCP for hypertension   Elayne Snare 02/05/2018, 8:37 AM      Note: This office note was prepared with Dragon voice recognition system technology. Any transcriptional errors that result from this process are unintentional.

## 2018-02-05 ENCOUNTER — Encounter: Payer: Self-pay | Admitting: Endocrinology

## 2018-02-05 ENCOUNTER — Other Ambulatory Visit: Payer: Self-pay | Admitting: Endocrinology

## 2018-02-05 ENCOUNTER — Ambulatory Visit: Payer: No Typology Code available for payment source | Admitting: Endocrinology

## 2018-02-05 VITALS — BP 124/72 | HR 82 | Ht 64.0 in | Wt 208.0 lb

## 2018-02-05 DIAGNOSIS — E890 Postprocedural hypothyroidism: Secondary | ICD-10-CM | POA: Diagnosis not present

## 2018-02-05 LAB — TSH: TSH: 52.5 u[IU]/mL — AB (ref 0.35–4.50)

## 2018-02-05 LAB — T4, FREE: FREE T4: 0.23 ng/dL — AB (ref 0.60–1.60)

## 2018-02-05 MED ORDER — LEVOTHYROXINE SODIUM 200 MCG PO TABS: 200.0000 ug | ORAL_TABLET | Freq: Every day | ORAL | 1 refills | Status: DC

## 2018-02-05 MED FILL — LEVOTHYROXINE 200 MCG TAB: 200 | 30 days supply | Qty: 30 | Fill #0

## 2018-02-06 ENCOUNTER — Telehealth: Payer: Self-pay | Admitting: Endocrinology

## 2018-02-06 NOTE — Telephone Encounter (Signed)
Patient stated she received a call from our office and believes it is regarding her labs and medication

## 2018-02-12 NOTE — Telephone Encounter (Signed)
Read pt the result note from her labs:  "Her thyroid level is still very low. Confirm that she is taking her thyroid pill daily. Needs to increase her dose to 200 mcg daily of levothyroxine instead of 112, prescription sent. Also needs to be seen in 2 months with follow-up labs 2 to 3 days before, please reschedule"

## 2018-02-22 MED FILL — ATORVASTATIN 10 MG TABLET: 10 | 90 days supply | Qty: 90 | Fill #0

## 2018-03-18 ENCOUNTER — Ambulatory Visit: Payer: No Typology Code available for payment source | Admitting: Gastroenterology

## 2018-04-19 ENCOUNTER — Other Ambulatory Visit (INDEPENDENT_AMBULATORY_CARE_PROVIDER_SITE_OTHER): Payer: No Typology Code available for payment source

## 2018-04-19 DIAGNOSIS — E890 Postprocedural hypothyroidism: Secondary | ICD-10-CM

## 2018-04-19 LAB — T4, FREE: Free T4: 0.22 ng/dL — ABNORMAL LOW (ref 0.60–1.60)

## 2018-04-19 LAB — TSH: TSH: 92.36 u[IU]/mL — ABNORMAL HIGH (ref 0.35–4.50)

## 2018-04-21 NOTE — Progress Notes (Signed)
Referring Physician: Kathyrn Lass  Reason for Appointment: Follow-up of thyroid    History of Present Illness:     Review of her record indicates that she had a toxic nodular goiter in 2009 although she thinks her only symptom at that time was fatigue and she does not remember any other symptoms like palpitations or weight loss Her scan showed heterogenous uptake within the thyroid and a 24 uptake of 32% She was treated with 29 mCi After this treatment she thinks she felt better with her fatigue  At some point subsequently patient was started on levothyroxine and she does not know whether she was having any symptoms at that time Her record indicates that she has been in the past taking between 50 up to 100 g of levothyroxine until 2018 However subsequently her thyroxine doses had been reduced and then tapered off because of her TSH being persistently low  Recent history:  Despite being off levothyroxine she had mild hyperthyroidism when first seen in 07/2017 although having no symptoms except weight gain She had mild T3 toxicosis  Thyroid scan showed heterogenous uptake, 24 uptake was 37%  She had treatment with 30 mCi of I-131 on 09/14/2017  Follow-up thyroid levels in 7/19 showed that TSH had gone up to 63.8  Since her thyroid level was still low in 9/19 her dose was increased from 112 levothyroxine up to 200 mcg  With increasing the thyroid supplement using 200 mcg levothyroxine she did feel fairly good and no symptoms of hyperthyroidism were occurring again However she did not understand the need for taking her medication regularly and did not realize she had a refill She has not taken her thyroid supplement since early October She is starting to feel more tired and cold again  Weight is down a couple of pounds  Patient's weight history is as follows:  Wt Readings from Last 3 Encounters:  04/22/18 210 lb (95.3 kg)  02/05/18 208 lb (94.3 kg)  12/12/17 210 lb  9.6 oz (95.5 kg)    Thyroid function results have been as follows:  Outside labs: TSH in 11/2016 was 0.94, previously 0.02 in 3/18 and 7.9 in 02/2016  TSH 0.07 in 03/2017 and in 04/2017 free T4 was 0.82 and free T3 4.8   Lab Results  Component Value Date   TSH 92.36 (H) 04/19/2018   TSH 52.50 (H) 02/05/2018   TSH 63.84 (H) 12/10/2017   TSH 0.19 (L) 10/15/2017   FREET4 0.22 (L) 04/19/2018   FREET4 0.23 (L) 02/05/2018   FREET4 0.22 (L) 12/10/2017   FREET4 0.88 10/15/2017   T3FREE 2.6 12/10/2017   T3FREE 3.9 10/15/2017   T3FREE 4.4 (H) 08/01/2017     Past Medical History:  Diagnosis Date  . Allergy   . Diabetes mellitus without complication ([place])   . Hypertension   . Thyroid disease     Past Surgical History:  Procedure Laterality Date  . BREAST BIOPSY Right   . PARTIAL HYSTERECTOMY     abdominal    Family History  Problem Relation Age of Onset  . Cancer Mother   . Breast cancer Mother   . Thyroid disease Mother   . Diabetes Other   . Hypertension Other   . Thyroid disease Maternal Aunt   . Thyroid disease Son        Hyperthyroid, not treated    Social History:  reports that she has never smoked. She has never used smokeless tobacco. She reports that she  does not drink alcohol or use drugs.  Allergies:  Allergies  Allergen Reactions  . Fexofenadine-Pseudoephed Er     REACTION: fast heart rate  . Hydrochlorothiazide     REACTION: Hypokalemia  . Latex Hives  . Pseudoephedrine     REACTION: Heart Rate    Allergies as of 04/22/2018      Reactions   Fexofenadine-pseudoephed Er    REACTION: fast heart rate   Hydrochlorothiazide    REACTION: Hypokalemia   Latex Hives   Pseudoephedrine    REACTION: Heart Rate      Medication List        Accurate as of 04/22/18  1:37 PM. Always use your most recent med list.          amLODipine 10 MG tablet Commonly known as:  NORVASC Take 10 mg by mouth daily.   levothyroxine 200 MCG tablet Commonly known  as:  SYNTHROID, LEVOTHROID Take 1 tablet (200 mcg total) by mouth daily before breakfast.   lisinopril 20 MG tablet Commonly known as:  PRINIVIL,ZESTRIL Take 20 mg by mouth daily.   simvastatin 10 MG tablet Commonly known as:  ZOCOR Take 10 mg by mouth daily.          Review of Systems      Blood pressure controlled, followed by her PCP for hypertension         Examination:    BP 132/82   Pulse 72   Ht 5\' 4"  (1.626 m)   Wt 210 lb (95.3 kg)   SpO2 98%   BMI 36.05 kg/m   Mild puffiness of face  Right thyroid lobe is palpable, nodular and about twice normal Left lobe is not clearly palpable  Skin appears normal Biceps reflexes show normal relaxation No peripheral edema   Assessment:  History of TOXIC nodular goiter dating back to 2009 treated with I-131 twice, last in 09/2017  Post ablative HYPOTHYROIDISM:  She has had significant hypothyroidism which is persisting  Patient does not understand that she is significantly hypothyroid and has no functioning thyroid tissue essentially Also she does not understand the need to take thyroid supplement daily and refill her medication on time  Currently with not being on the thyroid hormone for at least a month she is significantly hypothyroid with TSH 92 Surprisingly is not more symptomatic and does not look as hypothyroid as expected  GOITER: This appears to be smaller in size especially left side  PLAN:   She will start taking her 200 mcg of levothyroxine daily in the morning We will need to assess her thyroid levels once she is on regular treatment for about 2 months before making adjustments in her dosage Advised her to keep refills on time To follow-up in 2 months with repeat labs   Elayne Snare 04/22/2018, 1:37 PM      Note: This office note was prepared with Dragon voice recognition system technology. Any transcriptional errors that result from this process are unintentional.

## 2018-04-22 ENCOUNTER — Encounter: Payer: Self-pay | Admitting: Endocrinology

## 2018-04-22 ENCOUNTER — Ambulatory Visit (INDEPENDENT_AMBULATORY_CARE_PROVIDER_SITE_OTHER): Payer: No Typology Code available for payment source | Admitting: Endocrinology

## 2018-04-22 VITALS — BP 132/82 | HR 72 | Ht 64.0 in | Wt 210.0 lb

## 2018-04-22 DIAGNOSIS — E890 Postprocedural hypothyroidism: Secondary | ICD-10-CM

## 2018-04-22 DIAGNOSIS — E042 Nontoxic multinodular goiter: Secondary | ICD-10-CM

## 2018-04-22 MED ORDER — LEVOTHYROXINE SODIUM 200 MCG PO TABS: 200.0000 ug | ORAL_TABLET | Freq: Every day | ORAL | 1 refills | Status: DC

## 2018-04-22 MED FILL — LEVOTHYROXINE 200 MCG TAB: 200 | 30 days supply | Qty: 30 | Fill #0

## 2018-04-23 MED FILL — ESOMEPRAZOLE MAG DR 40 MG C: 40 | 90 days supply | Qty: 90 | Fill #0

## 2018-05-01 MED FILL — ATORVASTATIN CALCIUM 20 MG: 20 | 90 days supply | Qty: 90 | Fill #0

## 2018-05-08 MED FILL — LISINOPRIL 20 MG TABLET: 20 | 90 days supply | Qty: 90 | Fill #1

## 2018-05-08 MED FILL — AMLODIPINE BESYLATE 10 MG T: 10 | 90 days supply | Qty: 90 | Fill #1

## 2018-05-21 MED FILL — LEVOTHYROXINE 200 MCG TAB: 200 | 30 days supply | Qty: 30 | Fill #1

## 2018-06-03 MED FILL — NABUMETONE 500 MG TABLET: 500 | 30 days supply | Qty: 60 | Fill #0

## 2018-06-07 ENCOUNTER — Other Ambulatory Visit: Payer: No Typology Code available for payment source

## 2018-06-10 ENCOUNTER — Ambulatory Visit: Payer: No Typology Code available for payment source | Admitting: Endocrinology

## 2018-06-11 DIAGNOSIS — M25561 Pain in right knee: Secondary | ICD-10-CM | POA: Insufficient documentation

## 2018-06-14 ENCOUNTER — Other Ambulatory Visit (INDEPENDENT_AMBULATORY_CARE_PROVIDER_SITE_OTHER): Payer: No Typology Code available for payment source

## 2018-06-14 DIAGNOSIS — E890 Postprocedural hypothyroidism: Secondary | ICD-10-CM

## 2018-06-14 LAB — TSH

## 2018-06-14 LAB — T4, FREE: FREE T4: 2.55 ng/dL — AB (ref 0.60–1.60)

## 2018-06-17 ENCOUNTER — Ambulatory Visit (INDEPENDENT_AMBULATORY_CARE_PROVIDER_SITE_OTHER): Payer: No Typology Code available for payment source | Admitting: Endocrinology

## 2018-06-17 ENCOUNTER — Encounter: Payer: Self-pay | Admitting: Endocrinology

## 2018-06-17 VITALS — BP 110/74 | HR 74 | Ht 64.0 in | Wt 204.6 lb

## 2018-06-17 DIAGNOSIS — E049 Nontoxic goiter, unspecified: Secondary | ICD-10-CM | POA: Diagnosis not present

## 2018-06-17 DIAGNOSIS — I10 Essential (primary) hypertension: Secondary | ICD-10-CM | POA: Diagnosis not present

## 2018-06-17 DIAGNOSIS — E890 Postprocedural hypothyroidism: Secondary | ICD-10-CM | POA: Diagnosis not present

## 2018-06-17 MED ORDER — LEVOTHYROXINE SODIUM 150 MCG PO TABS: 150.0000 ug | ORAL_TABLET | Freq: Every day | ORAL | 3 refills | Status: DC

## 2018-06-17 MED FILL — LEVOTHYROXINE 150 MCG TAB: 150 | 30 days supply | Qty: 30 | Fill #0

## 2018-06-17 NOTE — Progress Notes (Signed)
Referring Physician: Kathyrn Lass  Reason for Appointment: Follow-up of thyroid    History of Present Illness:     Review of her record indicates that she had a toxic nodular goiter in 2009 although she thinks her only symptom at that time was fatigue and she does not remember any other symptoms like palpitations or weight loss Her scan showed heterogenous uptake within the thyroid and a 24 uptake of 32% She was treated with 29 mCi After this treatment she thinks she felt better with her fatigue  At some point subsequently patient was started on levothyroxine and she does not know whether she was having any symptoms at that time Her record indicates that she has been in the past taking between 50 up to 100 g of levothyroxine until 2018 However subsequently her thyroxine doses had been reduced and then tapered off because of her TSH being persistently low  Recent history:  Despite being off levothyroxine she had mild hyperthyroidism when first seen in 07/2017 although having no symptoms except weight gain She had mild T3 toxicosis  Thyroid scan showed heterogenous uptake, 24 uptake was 37%  She had treatment with 30 mCi of I-131 on 09/14/2017  Follow-up thyroid levels in 7/19 showed that TSH had gone up to 63.8 She was started on thyroid supplementation Since her thyroid level was still low in 9/19 her dose was increased from 112 levothyroxine up to 200 mcg  With increasing the thyroid supplement using 200 mcg levothyroxine she did feel fairly good and no symptoms of hyperthyroidism were occurring again However she did not understand the need for taking her medication regularly and had been out of her medications and she was seen in 04/2018 with marked increase in TSH  She was also having complaints of fatigue and cold intolerance  With going back on 200 mcg she has felt much better with her energy level and is more alert Her weight has gone down 6 pounds  However  she does not complain of any shakiness, palpitations, nervousness or excessive weight  Thyroid levels have however gone up markedly with suppressed TSH and high free T4  Patient's weight history is as follows:  Wt Readings from Last 3 Encounters:  06/17/18 204 lb 9.6 oz (92.8 kg)  04/22/18 210 lb (95.3 kg)  02/05/18 208 lb (94.3 kg)    Thyroid function results have been as follows:  Outside labs: TSH in 11/2016 was 0.94, previously 0.02 in 3/18 and 7.9 in 02/2016  TSH 0.07 in 03/2017 and in 04/2017 free T4 was 0.82 and free T3 4.8   Lab Results  Component Value Date   TSH 0.01 Repeated and verified X2. (L) 06/14/2018   TSH 92.36 (H) 04/19/2018   TSH 52.50 (H) 02/05/2018   TSH 63.84 (H) 12/10/2017   FREET4 2.55 (H) 06/14/2018   FREET4 0.22 (L) 04/19/2018   FREET4 0.23 (L) 02/05/2018   FREET4 0.22 (L) 12/10/2017   T3FREE 2.6 12/10/2017   T3FREE 3.9 10/15/2017   T3FREE 4.4 (H) 08/01/2017     Past Medical History:  Diagnosis Date  . Allergy   . Diabetes mellitus without complication ([place])   . Hypertension   . Thyroid disease     Past Surgical History:  Procedure Laterality Date  . BREAST BIOPSY Right   . PARTIAL HYSTERECTOMY     abdominal    Family History  Problem Relation Age of Onset  . Cancer Mother   . Breast cancer Mother   .  Thyroid disease Mother   . Diabetes Other   . Hypertension Other   . Thyroid disease Maternal Aunt   . Thyroid disease Son        Hyperthyroid, not treated    Social History:  reports that she has never smoked. She has never used smokeless tobacco. She reports that she does not drink alcohol or use drugs.  Allergies:  Allergies  Allergen Reactions  . Fexofenadine-Pseudoephed Er     REACTION: fast heart rate  . Hydrochlorothiazide     REACTION: Hypokalemia  . Latex Hives  . Pseudoephedrine     REACTION: Heart Rate    Allergies as of 06/17/2018      Reactions   Fexofenadine-pseudoephed Er    REACTION: fast heart rate    Hydrochlorothiazide    REACTION: Hypokalemia   Latex Hives   Pseudoephedrine    REACTION: Heart Rate      Medication List       Accurate as of June 17, 2018  3:48 PM. Always use your most recent med list.        amLODipine 10 MG tablet Commonly known as:  NORVASC Take 10 mg by mouth daily.   esomeprazole 40 MG capsule Commonly known as:  NEXIUM Take 40 mg by mouth daily as needed. TAKE 1 TABLET BY MOUTH ONCE DAILY AS NEEDED.   levothyroxine 200 MCG tablet Commonly known as:  SYNTHROID, LEVOTHROID Take 1 tablet (200 mcg total) by mouth daily before breakfast.   lisinopril 20 MG tablet Commonly known as:  PRINIVIL,ZESTRIL Take 20 mg by mouth daily.   nabumetone 500 MG tablet Commonly known as:  RELAFEN Take 500 mg by mouth 2 (two) times daily as needed. TAKE 1 TABLET BY MOUTH EVERY 12 HOURS AS NEEDED FOR KNEE PAIN.   simvastatin 10 MG tablet Commonly known as:  ZOCOR Take 10 mg by mouth daily.          Review of Systems      Blood pressure controlled, followed by her PCP for hypertension         Examination:    BP 110/74 (BP Location: Left Arm, Patient Position: Sitting, Cuff Size: Normal)   Pulse 74   Ht 5\' 4"  (1.626 m)   Wt 204 lb 9.6 oz (92.8 kg)   SpO2 96%   BMI 35.12 kg/m    Right thyroid lobe is palpable and about twice normal Left lobe is not palpable  No tremor, hands are not unusually warm Biceps reflexes show slightly brisk relaxation No peripheral edema   Assessment:  History of TOXIC nodular goiter dating back to 2009 treated with I-131 twice, last in 09/2017  Post ablative HYPOTHYROIDISM:  She has had significant posttreatment hypothyroidism, this continues to be significant  Previously has not had any consistent thyroid levels since her treatment and has been usually noncompliant or variably compliant with her supplementation Now with taking her levothyroxine on empty stomach daily she clearly has mild iatrogenic  hyperthyroidism although she is completely asymptomatic Previously had low levels when she was prescribed 112 mcg but has excessive levels on 200 mcg now  GOITER: This is about the same especially the right side enlargement  Hypertension: Well controlled PLAN:   She will reduce her levothyroxine down to 150 However discussed that she needs to be on long-term supplementation Currently not able to assess what her correct dose will be She does need to follow-up again in 2 months with repeat thyroid functions To continue taking her  levothyroxine before breakfast daily To call if she has any new fatigue  Total visit time 15 minutes  Elayne Snare 06/17/2018, 3:48 PM      Note: This office note was prepared with Dragon voice recognition system technology. Any transcriptional errors that result from this process are unintentional.

## 2018-06-26 ENCOUNTER — Ambulatory Visit: Payer: No Typology Code available for payment source | Attending: Orthopedic Surgery | Admitting: Physical Therapy

## 2018-06-26 ENCOUNTER — Encounter: Payer: Self-pay | Admitting: Physical Therapy

## 2018-06-26 ENCOUNTER — Other Ambulatory Visit: Payer: Self-pay

## 2018-06-26 DIAGNOSIS — R2689 Other abnormalities of gait and mobility: Secondary | ICD-10-CM

## 2018-06-26 DIAGNOSIS — G8929 Other chronic pain: Secondary | ICD-10-CM

## 2018-06-26 DIAGNOSIS — M25661 Stiffness of right knee, not elsewhere classified: Secondary | ICD-10-CM

## 2018-06-26 DIAGNOSIS — M25561 Pain in right knee: Secondary | ICD-10-CM | POA: Insufficient documentation

## 2018-06-27 ENCOUNTER — Encounter: Payer: Self-pay | Admitting: Physical Therapy

## 2018-06-27 NOTE — Therapy (Signed)
[place], [place], [address] Phone: [phone]   Fax:  [phone]  Physical Therapy Evaluation  Patient Details  Name: Jodi Wade MRN: [id number] Date of Birth: [birth year] Referring Provider (PT): Dr Dolly Rias   Encounter Date: 06/26/2018  PT End of Session - 06/26/18 1458    Visit Number  1    Number of Visits  12    Date for PT Re-Evaluation  08/07/18    Authorization Type  Focus plan     PT Start Time  0845    PT Stop Time  0927    PT Time Calculation (min)  42 min    Activity Tolerance  Patient tolerated treatment well    Behavior During Therapy  [facility] for tasks assessed/performed       Past Medical History:  Diagnosis Date  . Allergy   . Diabetes mellitus without complication ([place])   . Hypertension   . Thyroid disease     Past Surgical History:  Procedure Laterality Date  . BREAST BIOPSY Right   . PARTIAL HYSTERECTOMY     abdominal    There were no vitals filed for this visit.   Subjective Assessment - 06/26/18 0856    Subjective  Patient took a trip to [place] around [place] and began having significant pain in her right knee. Her pain has decreased in that itme but still increases when she perfroms activity. She was going to [place] but has stopped.     Limitations  Walking    How long can you sit comfortably?  Stiffens when she sits for a while     How long can you stand comfortably?  for a couple of hours     How long can you walk comfortably?  Pain at times.     Diagnostic tests  X-ray: per patient degeneration     Patient Stated Goals  Less pain; get back to exerciseing     Currently in Pain?  Yes    Pain Score  2     Pain Location  Knee    Pain Orientation  Right    Pain Descriptors / Indicators  Aching    Pain Type  Chronic pain    Pain Onset  More than a month ago    Pain Frequency  Constant    Aggravating Factors   weather, zumba,     Pain Relieving  Factors  elevation; rest     Effect of Pain on Daily Activities  difficulty perfroming ADL's          [facility] PT Assessment - 06/27/18 0001      Assessment   Medical Diagnosis  Right Knee Pain     Referring Provider (PT)  Dr Lanae Crumbly Swinteck    Onset Date/Surgical Date  05/29/19    Hand Dominance  Right    Next MD Visit  6 weeks     Prior Therapy  None       Precautions   Precautions  None      Restrictions   Weight Bearing Restrictions  No      Balance Screen   Has the patient fallen in the past 6 months  No    Has the patient had a decrease in activity level because of a fear of falling?   No    Is the patient reluctant to leave their home because of a fear of falling?   No  Home Environment   Additional Comments  3 steps into the house       Prior Function   Level of Independence  Independent    Vocation  Full time employment    Vocation Requirements  works at the front desk at [employer], Going to the gym       Cognition   Overall Cognitive Status  Within Functional Limits for tasks assessed    Attention  Focused    Focused Attention  Appears intact    Memory  Appears intact    Awareness  Appears intact    Problem Solving  Appears intact      Observation/Other Assessments   Focus on Therapeutic Outcomes (FOTO)   31 % limitation       Sensation   Light Touch  Appears Intact    Additional Comments  pain goesinto her right quad at times       Coordination   Gross Motor Movements are Fluid and Coordinated  Yes    Fine Motor Movements are Fluid and Coordinated  Yes      Posture/Postural Control   Posture/Postural Control  Postural limitations    Postural Limitations  Rounded Shoulders      ROM / Strength   AROM / PROM / Strength  AROM;PROM;Strength      AROM   Overall AROM Comments  pain with end range active extension on the right full flexion       PROM   Overall PROM Comments  pain with end range extension       Strength   Strength  Assessment Site  Knee;Hip    Right/Left Hip  Right    Right Hip Flexion  4/5    Right/Left Knee  Right    Right Knee Flexion  4+/5    Right Knee Extension  4/5      Palpation   Patella mobility  limited superior/ inferior glide of the patella     Palpation comment  tenderness to palpation in the medial tibial area       Ambulation/Gait   Gait Comments  mild decrease in  single leg stance on the left side .                 Objective measurements completed on examination: See above findings.      [facility] PT Treatment/Exercise - 06/27/18 0001      Exercises   Exercises  Knee/Hip      Knee/Hip Exercises: Stretches   Active Hamstring Stretch Limitations  3x20 sec hold reviewed seated and with strap       Knee/Hip Exercises: Supine   Quad Sets Limitations  3x10     Other Supine Knee/Hip Exercises  clam shell 3x10 green     Other Supine Knee/Hip Exercises  [id number]       Manual Therapy   Manual therapy comments  gentle manual inferior and superior glide of the patella              PT Education - 06/26/18 1457    Person(s) Educated  Patient    Methods  Explanation;Tactile cues;Demonstration;Verbal cues;Handout    Comprehension  Verbalized understanding;Returned demonstration;Verbal cues required;Tactile cues required;Need further instruction       PT Short Term Goals - 06/27/18 1301      PT SHORT TERM GOAL #1   Title  Patient will demostrate full active right extension without pain     Time  3    Period  Weeks    Status  New    Target Date  07/18/18      PT SHORT TERM GOAL #2   Title  Patient will improve gross right knee strength to 5/5     Time  3    Period  Weeks    Status  New    Target Date  07/18/18      PT SHORT TERM GOAL #3   Title  Patient wil be independent with inital HEP    Time  3    Period  Weeks    Target Date  07/18/18        PT Long Term Goals - 06/27/18 1316      PT LONG TERM GOAL #1   Title  Patient will return to  the gym without increased pain     Time  6    Period  Weeks    Status  New    Target Date  08/08/18      PT LONG TERM GOAL #2   Title  Patient will dmeonstrate a 28% limitation on FOTO     Time  6    Period  Weeks    Status  New    Target Date  08/08/18             Plan - 06/26/18 1632    Clinical Impression Statement  Patient is a 58 year old female with left knee pain. Signs and symptoms are consitent with right knee arthritis and right patellar hypomobility. She has focal swelling in her medial inferior knee. She has pain with full extension. she would benefit from skilled therapy to improve her strength and stability of the right knee. She would like to progress back to the gym.     History and Personal Factors relevant to plan of care:  right knee arthritis     Clinical Presentation  Evolving    Clinical Presentation due to:  increasing pain with activity     Clinical Decision Making  Moderate    Rehab Potential  Good    PT Frequency  2x / week    PT Duration  6 weeks    PT Treatment/Interventions  ADLs/Self Care Home Management;Cryotherapy;Moist Heat;Traction;Ultrasound;Gait training;DME Instruction;Therapeutic activities;Therapeutic exercise;Neuromuscular re-education;Manual techniques;Taping    PT Next Visit Plan  reviewe standing ther-ex; add bridginf; patella mobilization with heat; work on extension; consider SLS; gym exercises;     PT Home Exercise Plan  slr; quad set; hamstring stretch, clamshell     Consulted and Agree with Plan of Care  Patient       Patient will benefit from skilled therapeutic intervention in order to improve the following deficits and impairments:  Pain, Difficulty walking, Decreased activity tolerance, Decreased endurance, Decreased range of motion, Decreased strength, Hypomobility  Visit Diagnosis: Chronic pain of right knee  Stiffness of right knee, not elsewhere classified  Other abnormalities of gait and mobility     Problem  List Patient Active Problem List   Diagnosis Date Noted  . Postablative hypothyroidism 04/22/2018  . Hyperlipemia 01/13/2016  . VITAMIN D DEFICIENCY 04/01/2008    Carney Living  PT DPT  06/27/2018, 1:21 PM  [facility] [address] [place], [place], [address] Phone: [phone]   Fax:  [phone]  Name: Jodi Wade MRN: [id number] Date of Birth: 01/01/1961

## 2018-07-03 ENCOUNTER — Encounter: Payer: No Typology Code available for payment source | Admitting: Physical Therapy

## 2018-07-05 ENCOUNTER — Ambulatory Visit: Payer: No Typology Code available for payment source | Admitting: Physical Therapy

## 2018-07-05 ENCOUNTER — Encounter: Payer: Self-pay | Admitting: Physical Therapy

## 2018-07-05 DIAGNOSIS — M25561 Pain in right knee: Principal | ICD-10-CM

## 2018-07-05 DIAGNOSIS — G8929 Other chronic pain: Secondary | ICD-10-CM

## 2018-07-05 DIAGNOSIS — M25661 Stiffness of right knee, not elsewhere classified: Secondary | ICD-10-CM

## 2018-07-05 DIAGNOSIS — R2689 Other abnormalities of gait and mobility: Secondary | ICD-10-CM

## 2018-07-07 NOTE — Therapy (Addendum)
[place], [place], [address] Phone: [phone]   Fax:  [phone]  Physical Therapy Treatment  Patient Details  Name: Jodi Wade MRN: [id number] Date of Birth: February 15, 1961 Referring Provider (PT): Dr Dolly Rias   Encounter Date: 07/05/2018    Past Medical History:  Diagnosis Date  . Allergy   . Diabetes mellitus without complication ([place])   . Hypertension   . Thyroid disease     Past Surgical History:  Procedure Laterality Date  . BREAST BIOPSY Right   . PARTIAL HYSTERECTOMY     abdominal    There were no vitals filed for this visit.                    [facility] PT Treatment/Exercise - 07/07/18 0001      Knee/Hip Exercises: Stretches   Active Hamstring Stretch Limitations  3x20 sec hold reviewed seated and with strap     Other Knee/Hip Stretches  thomas stretch 3x20 sec hold       Knee/Hip Exercises: Standing   Heel Raises Limitations  2x10    Other Standing Knee Exercises  standing march 2x10; 3 way hip 2x10;       Manual Therapy   Manual therapy comments  gentle patella mobilization                PT Short Term Goals - 06/27/18 1301      PT SHORT TERM GOAL #1   Title  Patient will demostrate full active right extension without pain     Time  3    Period  Weeks    Status  New    Target Date  07/18/18      PT SHORT TERM GOAL #2   Title  Patient will improve gross right knee strength to 5/5     Time  3    Period  Weeks    Status  New    Target Date  07/18/18      PT SHORT TERM GOAL #3   Title  Patient wil be independent with inital HEP    Time  3    Period  Weeks    Target Date  07/18/18        PT Long Term Goals - 06/27/18 1316      PT LONG TERM GOAL #1   Title  Patient will return to the gym without increased pain     Time  6    Period  Weeks    Status  New    Target Date  08/08/18      PT LONG TERM GOAL #2   Title   Patient will dmeonstrate a 28% limitation on FOTO     Time  6    Period  Weeks    Status  New    Target Date  08/08/18            Plan - 07/07/18 1644    Clinical Impression Statement  Patient was given standing routine. She had no increasein pain. She was advised to cotninue strengthening at home. Therapy also put the patient on the nu-step. She reported it felt great. She will be changing jobs. Her chart will be left open for a potential transfer to [facility] outpatient rehab.     Clinical Presentation  Evolving    Clinical Decision Making  Moderate    Rehab Potential  Good    PT Frequency  2x /  week    PT Duration  6 weeks    PT Treatment/Interventions  ADLs/Self Care Home Management;Cryotherapy;Moist Heat;Traction;Ultrasound;Gait training;DME Instruction;Therapeutic activities;Therapeutic exercise;Neuromuscular re-education;Manual techniques;Taping    PT Next Visit Plan  reviewe standing ther-ex; add bridginf; patella mobilization with heat; work on extension; consider SLS; gym exercises;     PT Home Exercise Plan  slr; quad set; hamstring stretch, clamshell     Consulted and Agree with Plan of Care  Patient       Patient will benefit from skilled therapeutic intervention in order to improve the following deficits and impairments:  Pain, Difficulty walking, Decreased activity tolerance, Decreased endurance, Decreased range of motion, Decreased strength, Hypomobility  Visit Diagnosis: Chronic pain of right knee  Stiffness of right knee, not elsewhere classified  Other abnormalities of gait and mobility  PHYSICAL THERAPY DISCHARGE SUMMARY  Visits from Start of Care: 2  Current functional level related to goals / functional outcomes: Improved pain    Remaining deficits: Unknown patient moved    Education / Equipment: HEP   Plan: Patient agrees to discharge.  Patient goals were partially met. Patient is being discharged due to not returning since the last visit.   ?????       Problem List Patient Active Problem List   Diagnosis Date Noted  . Postablative hypothyroidism 04/22/2018  . Hyperlipemia 01/13/2016  . VITAMIN D DEFICIENCY 04/01/2008    Carney Living PT DPT  07/07/2018, 4:52 PM  [facility] [address] [place], [place], [address] Phone: [phone]   Fax:  [phone]  Name: Jodi Wade MRN: [id number] Date of Birth: 1961/05/17

## 2018-07-15 MED FILL — LEVOTHYROXINE 150 MCG TAB: 150 | 30 days supply | Qty: 30 | Fill #1

## 2018-07-19 DIAGNOSIS — M179 Osteoarthritis of knee, unspecified: Secondary | ICD-10-CM | POA: Insufficient documentation

## 2018-08-15 MED FILL — LEVOTHYROXINE 150 MCG TAB: 150 | 30 days supply | Qty: 30 | Fill #2

## 2018-08-15 MED FILL — AMLODIPINE BESYLATE 10 MG T: 10 | 90 days supply | Qty: 90 | Fill #0

## 2018-08-15 MED FILL — LISINOPRIL 20 MG TABLET: 20 | 90 days supply | Qty: 90 | Fill #2

## 2018-08-16 ENCOUNTER — Other Ambulatory Visit: Payer: Self-pay

## 2018-08-16 ENCOUNTER — Other Ambulatory Visit (INDEPENDENT_AMBULATORY_CARE_PROVIDER_SITE_OTHER): Payer: No Typology Code available for payment source

## 2018-08-16 DIAGNOSIS — E890 Postprocedural hypothyroidism: Secondary | ICD-10-CM

## 2018-08-16 LAB — T4, FREE: Free T4: 1.75 ng/dL — ABNORMAL HIGH (ref 0.60–1.60)

## 2018-08-16 LAB — TSH: TSH: <0.01 u[IU]/mL — ABNORMAL LOW (ref 0.35–4.50)

## 2018-08-21 ENCOUNTER — Encounter: Payer: Self-pay | Admitting: Endocrinology

## 2018-08-21 ENCOUNTER — Other Ambulatory Visit: Payer: Self-pay

## 2018-08-21 ENCOUNTER — Ambulatory Visit (INDEPENDENT_AMBULATORY_CARE_PROVIDER_SITE_OTHER): Payer: No Typology Code available for payment source | Admitting: Endocrinology

## 2018-08-21 VITALS — BP 140/80 | HR 81 | Temp 98.1°F | Ht 64.0 in | Wt 201.8 lb

## 2018-08-21 DIAGNOSIS — E890 Postprocedural hypothyroidism: Secondary | ICD-10-CM | POA: Diagnosis not present

## 2018-08-21 MED ORDER — LEVOTHYROXINE SODIUM 112 MCG PO TABS: 112.0000 ug | ORAL_TABLET | Freq: Every day | ORAL | 3 refills | Status: DC

## 2018-08-21 MED FILL — LEVOTHYROXINE 112 MCG TAB: 112 | 30 days supply | Qty: 30 | Fill #0 | Status: TO

## 2018-08-21 NOTE — Progress Notes (Signed)
Referring Physician: Kathyrn Lass  Reason for Appointment: Follow-up of thyroid    History of Present Illness:     Review of her record indicates that she had a toxic nodular goiter in 2009 although she thinks her only symptom at that time was fatigue and she does not remember any other symptoms like palpitations or weight loss Her scan showed heterogenous uptake within the thyroid and a 24 uptake of 32% She was treated with 29 mCi After this treatment she thinks she felt better with her fatigue  At some point subsequently patient was started on levothyroxine and she does not know whether she was having any symptoms at that time Her record indicates that she has been in the past taking between 50 up to 100 g of levothyroxine until 2018 However subsequently her thyroxine doses had been reduced and then tapered off because of her TSH being persistently low  Recent history:  Despite being off levothyroxine she had mild hyperthyroidism when first seen in 07/2017 although having no symptoms except weight gain She had mild T3 toxicosis  Thyroid scan showed heterogenous uptake, 24 uptake was 37%  She had radioactive iodine treatment with 30 mCi of I-131 on 09/14/2017  Follow-up thyroid levels in 7/19 showed that TSH had gone up to 63.8 She was started on thyroid supplementation Since her TSH was still high at 52 in 9/19 her dose was increased from 112 levothyroxine up to 200 mcg  With increasing the thyroid supplement using 200 mcg levothyroxine she did feel fairly good and no symptoms of hyperthyroidism were occurring again However she did not understand the need for taking her medication regularly and had been out of her medications and she was seen in 04/2018 with marked increase in TSH She was also having complaints of fatigue and cold intolerance  With going back on 200 mcg she felt better but her TSH was suppressed and free T4 significantly high at 2.55 Now she is  taking 150 mcg daily She takes this regularly before breakfast  Her weight has gone down slightly again  Again she does not complain of any shakiness, palpitations, nervousness or sweating  TSH is still suppressed although free T4 is not as elevated  Patient's weight history is as follows:  Wt Readings from Last 3 Encounters:  08/21/18 201 lb 12.8 oz (91.5 kg)  06/17/18 204 lb 9.6 oz (92.8 kg)  04/22/18 210 lb (95.3 kg)    Thyroid function results have been as follows:  Outside labs: TSH in 11/2016 was 0.94, previously 0.02 in 3/18 and 7.9 in 02/2016  TSH 0.07 in 03/2017 and in 04/2017 free T4 was 0.82 and free T3 4.8   Lab Results  Component Value Date   TSH <0.01 (L) 08/16/2018   TSH 0.01 Repeated and verified X2. (L) 06/14/2018   TSH 92.36 (H) 04/19/2018   TSH 52.50 (H) 02/05/2018   FREET4 1.75 (H) 08/16/2018   FREET4 2.55 (H) 06/14/2018   FREET4 0.22 (L) 04/19/2018   FREET4 0.23 (L) 02/05/2018   T3FREE 2.6 12/10/2017   T3FREE 3.9 10/15/2017   T3FREE 4.4 (H) 08/01/2017     Past Medical History:  Diagnosis Date  . Allergy   . Diabetes mellitus without complication ([place])   . Hypertension   . Thyroid disease     Past Surgical History:  Procedure Laterality Date  . BREAST BIOPSY Right   . PARTIAL HYSTERECTOMY     abdominal    Family History  Problem  Relation Age of Onset  . Cancer Mother   . Breast cancer Mother   . Thyroid disease Mother   . Diabetes Other   . Hypertension Other   . Thyroid disease Maternal Aunt   . Thyroid disease Son        Hyperthyroid, not treated    Social History:  reports that she has never smoked. She has never used smokeless tobacco. She reports that she does not drink alcohol or use drugs.  Allergies:  Allergies  Allergen Reactions  . Fexofenadine-Pseudoephed Er     REACTION: fast heart rate  . Hydrochlorothiazide     REACTION: Hypokalemia  . Latex Hives  . Pseudoephedrine     REACTION: Heart Rate    Allergies  as of 08/21/2018      Reactions   Fexofenadine-pseudoephed Er    REACTION: fast heart rate   Hydrochlorothiazide    REACTION: Hypokalemia   Latex Hives   Pseudoephedrine    REACTION: Heart Rate      Medication List       Accurate as of August 21, 2018  3:29 PM. Always use your most recent med list.        amLODipine 10 MG tablet Commonly known as:  NORVASC Take 10 mg by mouth daily.   esomeprazole 40 MG capsule Commonly known as:  NEXIUM Take 40 mg by mouth daily as needed. TAKE 1 TABLET BY MOUTH ONCE DAILY AS NEEDED.   levothyroxine 150 MCG tablet Commonly known as:  SYNTHROID, LEVOTHROID Take 1 tablet (150 mcg total) by mouth daily.   lisinopril 20 MG tablet Commonly known as:  PRINIVIL,ZESTRIL Take 20 mg by mouth daily.   nabumetone 500 MG tablet Commonly known as:  RELAFEN Take 500 mg by mouth 2 (two) times daily as needed. TAKE 1 TABLET BY MOUTH EVERY 12 HOURS AS NEEDED FOR KNEE PAIN.   simvastatin 10 MG tablet Commonly known as:  ZOCOR Take 10 mg by mouth daily.          Review of Systems      Blood pressure controlled, followed by her PCP for hypertension         Examination:    BP 140/80 (BP Location: Left Arm, Patient Position: Sitting, Cuff Size: Normal)   Pulse 81   Temp 98.1 F (36.7 C) (Oral)   Ht 5\' 4"  (1.626 m)   Wt 201 lb 12.8 oz (91.5 kg)   SpO2 98%   BMI 34.64 kg/m   She is not anxious or hyperkinetic Right thyroid lobe is palpable, smooth and firm and about twice normal Left lobe is not palpable  No tremor, skin temperature appears normal Biceps reflexes show normal relaxation   Assessment:  History of TOXIC nodular goiter dating back to 2009 treated with I-131 twice, last in 09/2017  Post ablative HYPOTHYROIDISM:  She has had significant posttreatment hypothyroidism with TSH as high as 92  She has not had any consistent thyroid levels since her radioactive iodine treatment  Although initially appeared to be needing  progressively larger doses of thyroid supplementation her thyroid levels have been continued to be high the last 2 times she has been tested She takes her levothyroxine consistently before breakfast now Now with taking 150 mcg levothyroxine her thyroid levels are still higher but not as much as on 200 mcg  Surprisingly asymptomatic and looks euthyroid  GOITER: Slightly smaller on the right side  Hypertension: Well controlled  PLAN:   She will reduce her levothyroxine  down to 112 mcg Explained to her that she is still significantly hypothyroid but requiring lower doses than initially and likely has some residual thyroid function She will need another follow-up in 2 months Reminded her about when to take her levothyroxine and to call if she has any worsening fatigue or palpitations  Total visit time 15 minutes  Elayne Snare 08/21/2018, 3:29 PM      Note: This office note was prepared with Dragon voice recognition system technology. Any transcriptional errors that result from this process are unintentional.

## 2018-09-06 MED FILL — LEVOTHYROXINE 112 MCG TAB: 112 | 90 days supply | Qty: 90 | Fill #0

## 2018-09-06 MED FILL — ATORVASTATIN 20 MG TABLET: 20 | 90 days supply | Qty: 90 | Fill #0

## 2018-10-17 ENCOUNTER — Other Ambulatory Visit: Payer: Self-pay

## 2018-10-17 ENCOUNTER — Other Ambulatory Visit (INDEPENDENT_AMBULATORY_CARE_PROVIDER_SITE_OTHER): Payer: No Typology Code available for payment source

## 2018-10-17 DIAGNOSIS — E890 Postprocedural hypothyroidism: Secondary | ICD-10-CM

## 2018-10-17 LAB — TSH: TSH: 0.03 u[IU]/mL — ABNORMAL LOW (ref 0.35–4.50)

## 2018-10-17 LAB — T4, FREE: Free T4: 1.26 ng/dL (ref 0.60–1.60)

## 2018-10-18 ENCOUNTER — Other Ambulatory Visit: Payer: No Typology Code available for payment source

## 2018-10-21 NOTE — Progress Notes (Signed)
Referring Physician: Kathyrn Lass  Reason for Appointment: Follow-up of thyroid   Today's office visit was provided via telemedicine using video technique Explained to the patient and the the limitations of evaluation and management by telemedicine and the availability of in person appointments.  The patient understood the limitations and agreed to proceed. Patient also understood that the telehealth visit is billable. . Location of the patient: Home . Location of the provider: Office Only the patient and myself were participating in the encounter     History of Present Illness:     Review of her record indicates that she had a toxic nodular goiter in 2009 although she thinks her only symptom at that time was fatigue and she does not remember any other symptoms like palpitations or weight loss Her scan showed heterogenous uptake within the thyroid and a 24 uptake of 32% She was treated with 29 mCi After this treatment she thinks she felt better with her fatigue  At some point subsequently patient was started on levothyroxine and she does not know whether she was having any symptoms at that time Her record indicates that she has been in the past taking between 50 up to 100 g of levothyroxine until 2018 However subsequently her thyroxine doses had been reduced and then tapered off because of her TSH being persistently low  Recent history:  Despite being off levothyroxine she had mild hyperthyroidism when first seen in 07/2017 although having no symptoms except weight gain She had mild T3 toxicosis  Thyroid scan showed heterogenous uptake, 24 uptake was 37%  She had radioactive iodine treatment with 30 mCi of I-131 on 09/14/2017  Follow-up thyroid levels in 7/19 showed that TSH had gone up to 63.8 She was started on thyroid supplementation Since her TSH was still high at 52 in 9/19 her dose was increased from 112 levothyroxine up to 200 mcg  With increasing the  thyroid supplement using 200 mcg levothyroxine she did feel fairly good and no symptoms of hyperthyroidism were occurring again However she did not understand the need for taking her medication regularly and had been out of her medications and she was seen in 04/2018 with marked increase in TSH She was also having complaints of fatigue and cold intolerance Currently on levothyroxine 200 mcg she felt better but her TSH was suppressed and free T4 significantly high at 2.55  However she continues to require lower doses of levothyroxine TSH being suppressed Now taking 112 mcg levothyroxine  She takes this regularly before breakfast  As before she does not have any symptoms of hypothyroidism with her high thyroid levels Currently does not complain of fatigue, palpitations or shakiness   TSH is still suppressed although free T4 is not elevated at 1.26  Patient's weight history is as follows:  Wt Readings from Last 3 Encounters:  08/21/18 201 lb 12.8 oz (91.5 kg)  06/17/18 204 lb 9.6 oz (92.8 kg)  04/22/18 210 lb (95.3 kg)    Thyroid function results have been as follows:  Outside labs: TSH in 11/2016 was 0.94, previously 0.02 in 3/18 and 7.9 in 02/2016  TSH 0.07 in 03/2017 and in 04/2017 free T4 was 0.82 and free T3 4.8   Lab Results  Component Value Date   TSH 0.03 (L) 10/17/2018   TSH <0.01 (L) 08/16/2018   TSH 0.01 Repeated and verified X2. (L) 06/14/2018   TSH 92.36 (H) 04/19/2018   FREET4 1.26 10/17/2018   FREET4 1.75 (H) 08/16/2018  FREET4 2.55 (H) 06/14/2018   FREET4 0.22 (L) 04/19/2018   T3FREE 2.6 12/10/2017   T3FREE 3.9 10/15/2017   T3FREE 4.4 (H) 08/01/2017     Past Medical History:  Diagnosis Date  . Allergy   . Diabetes mellitus without complication ([place])   . Hypertension   . Thyroid disease     Past Surgical History:  Procedure Laterality Date  . BREAST BIOPSY Right   . PARTIAL HYSTERECTOMY     abdominal    Family History  Problem Relation Age of  Onset  . Cancer Mother   . Breast cancer Mother   . Thyroid disease Mother   . Diabetes Other   . Hypertension Other   . Thyroid disease Maternal Aunt   . Thyroid disease Son        Hyperthyroid, not treated    Social History:  reports that she has never smoked. She has never used smokeless tobacco. She reports that she does not drink alcohol or use drugs.  Allergies:  Allergies  Allergen Reactions  . Fexofenadine-Pseudoephed Er     REACTION: fast heart rate  . Hydrochlorothiazide     REACTION: Hypokalemia  . Latex Hives  . Pseudoephedrine     REACTION: Heart Rate    Allergies as of 10/22/2018      Reactions   Fexofenadine-pseudoephed Er    REACTION: fast heart rate   Hydrochlorothiazide    REACTION: Hypokalemia   Latex Hives   Pseudoephedrine    REACTION: Heart Rate      Medication List       Accurate as of Oct 21, 2018  9:03 PM. If you have any questions, ask your nurse or doctor.        amLODipine 10 MG tablet Commonly known as:  NORVASC Take 10 mg by mouth daily.   esomeprazole 40 MG capsule Commonly known as:  NEXIUM Take 40 mg by mouth daily as needed. TAKE 1 TABLET BY MOUTH ONCE DAILY AS NEEDED.   levothyroxine 112 MCG tablet Commonly known as:  SYNTHROID Take 1 tablet (112 mcg total) by mouth daily.   lisinopril 20 MG tablet Commonly known as:  ZESTRIL Take 20 mg by mouth daily.   nabumetone 500 MG tablet Commonly known as:  RELAFEN Take 500 mg by mouth 2 (two) times daily as needed. TAKE 1 TABLET BY MOUTH EVERY 12 HOURS AS NEEDED FOR KNEE PAIN.   simvastatin 10 MG tablet Commonly known as:  ZOCOR Take 10 mg by mouth daily.          Review of Systems      Blood pressure controlled, followed by her PCP for hypertension         Examination:    There were no vitals taken for this visit.  Exam not done, patient seen on remote visit  Assessment:  History of TOXIC nodular goiter dating back to 2009 treated with I-131 twice, last in  09/2017  Post ablative HYPOTHYROIDISM:  She has had significant posttreatment hypothyroidism with TSH as high as 92  She has not had any consistent thyroid levels since her radioactive iodine treatment with various doses of levothyroxine  Although initially appeared to be needing progressively larger doses of thyroid supplementation her thyroid levels have been continued to be higher than normal Now with taking 112 mcg of levothyroxine although her free T4 is normal her TSH is significantly suppressed again Her free T4 of 1.26 indicates that the only a slight further reduction of her levothyroxine  requirement would be needed  PLAN:   She will reduce her levothyroxine down to 88 mcg now She will take this before breakfast daily Need to report any symptoms of fatigue or lethargy if she has before her next visit  She will need another follow-up in 2 months  Continue follow-up with PCP for hypertension  Elayne Snare 10/21/2018, 9:03 PM      Note: This office note was prepared with Dragon voice recognition system technology. Any transcriptional errors that result from this process are unintentional.

## 2018-10-22 ENCOUNTER — Encounter: Payer: Self-pay | Admitting: Endocrinology

## 2018-10-22 ENCOUNTER — Ambulatory Visit (INDEPENDENT_AMBULATORY_CARE_PROVIDER_SITE_OTHER): Payer: No Typology Code available for payment source | Admitting: Endocrinology

## 2018-10-22 ENCOUNTER — Other Ambulatory Visit: Payer: Self-pay

## 2018-10-22 DIAGNOSIS — E890 Postprocedural hypothyroidism: Secondary | ICD-10-CM | POA: Diagnosis not present

## 2018-10-22 MED ORDER — LEVOTHYROXINE SODIUM 88 MCG PO TABS: 88.0000 ug | ORAL_TABLET | Freq: Every day | ORAL | 3 refills | Status: DC

## 2018-10-24 MED FILL — LEVOTHYROXINE 88 MCG TABLET: 88 | 30 days supply | Qty: 30 | Fill #0

## 2018-11-01 ENCOUNTER — Other Ambulatory Visit: Payer: Self-pay | Admitting: Family Medicine

## 2018-11-01 DIAGNOSIS — Z1231 Encounter for screening mammogram for malignant neoplasm of breast: Secondary | ICD-10-CM

## 2018-11-21 MED FILL — LEVOTHYROXINE 88 MCG TABLET: 88 | 30 days supply | Qty: 30 | Fill #0

## 2018-12-04 MED FILL — LISINOPRIL 20 MG TABLET: 20 | 90 days supply | Qty: 90 | Fill #3

## 2018-12-04 MED FILL — AMLODIPINE BESYLATE 10 MG T: 10 | 90 days supply | Qty: 90 | Fill #0

## 2018-12-04 MED FILL — ATORVASTATIN 20 MG TABLET: 20 | 90 days supply | Qty: 90 | Fill #0

## 2018-12-09 ENCOUNTER — Emergency Department (HOSPITAL_COMMUNITY)
Admission: EM | Admit: 2018-12-09 | Discharge: 2018-12-09 | Disposition: A | Discharge status: Home / Self Care | Payer: No Typology Code available for payment source | Attending: Emergency Medicine | Admitting: Emergency Medicine

## 2018-12-09 ENCOUNTER — Encounter (HOSPITAL_COMMUNITY): Payer: Self-pay | Admitting: Emergency Medicine

## 2018-12-09 ENCOUNTER — Other Ambulatory Visit: Payer: Self-pay

## 2018-12-09 DIAGNOSIS — R519 Headache, unspecified: Secondary | ICD-10-CM

## 2018-12-09 DIAGNOSIS — I10 Essential (primary) hypertension: Secondary | ICD-10-CM | POA: Insufficient documentation

## 2018-12-09 DIAGNOSIS — E119 Type 2 diabetes mellitus without complications: Secondary | ICD-10-CM | POA: Diagnosis not present

## 2018-12-09 DIAGNOSIS — Z79899 Other long term (current) drug therapy: Secondary | ICD-10-CM | POA: Diagnosis not present

## 2018-12-09 DIAGNOSIS — R51 Headache: Secondary | ICD-10-CM | POA: Diagnosis not present

## 2018-12-09 DIAGNOSIS — E079 Disorder of thyroid, unspecified: Secondary | ICD-10-CM | POA: Insufficient documentation

## 2018-12-09 DIAGNOSIS — Z91040 Latex allergy status: Secondary | ICD-10-CM | POA: Insufficient documentation

## 2018-12-09 DIAGNOSIS — R000 Tachycardia, unspecified: Secondary | ICD-10-CM | POA: Diagnosis not present

## 2018-12-09 DIAGNOSIS — R079 Chest pain, unspecified: Secondary | ICD-10-CM | POA: Diagnosis not present

## 2018-12-09 DIAGNOSIS — Z888 Allergy status to other drugs, medicaments and biological substances status: Secondary | ICD-10-CM | POA: Insufficient documentation

## 2018-12-09 LAB — CBC WITH DIFFERENTIAL/PLATELET
Abs Immature Granulocytes: 0.04 10*3/uL (ref 0.00–0.07)
Basophils Absolute: 0 10*3/uL (ref 0.0–0.1)
Basophils Relative: 0 %
Eosinophils Absolute: 0 10*3/uL (ref 0.0–0.5)
Eosinophils Relative: 1 %
HCT: 42.3 % (ref 36.0–46.0)
Hemoglobin: 13.8 g/dL (ref 12.0–15.0)
Immature Granulocytes: 1 %
Lymphocytes Relative: 28 %
Lymphs Abs: 1.8 10*3/uL (ref 0.7–4.0)
MCH: 26.7 pg (ref 26.0–34.0)
MCHC: 32.6 g/dL (ref 30.0–36.0)
MCV: 82 fL (ref 80.0–100.0)
Monocytes Absolute: 0.6 10*3/uL (ref 0.1–1.0)
Monocytes Relative: 9 %
Neutro Abs: 3.9 10*3/uL (ref 1.7–7.7)
Neutrophils Relative %: 61 %
Platelets: 343 10*3/uL (ref 150–400)
RBC: 5.16 MIL/uL — ABNORMAL HIGH (ref 3.87–5.11)
RDW: 14.6 % (ref 11.5–15.5)
WBC: 6.4 10*3/uL (ref 4.0–10.5)
nRBC: 0 % (ref 0.0–0.2)

## 2018-12-09 LAB — COMPREHENSIVE METABOLIC PANEL
ALT: 18 U/L (ref 0–44)
AST: 16 U/L (ref 15–41)
Albumin: 4.1 g/dL (ref 3.5–5.0)
Alkaline Phosphatase: 78 U/L (ref 38–126)
Anion gap: 9 (ref 5–15)
BUN: 12 mg/dL (ref 6–20)
CO2: 23 mmol/L (ref 22–32)
Calcium: 9.1 mg/dL (ref 8.9–10.3)
Chloride: 107 mmol/L (ref 98–111)
Creatinine, Ser: 0.63 mg/dL (ref 0.44–1.00)
GFR calc Af Amer: >60 mL/min (ref >60)
GFR calc non Af Amer: >60 mL/min (ref >60)
Glucose, Bld: 106 mg/dL — ABNORMAL HIGH (ref 70–99)
Potassium: 3.3 mmol/L — ABNORMAL LOW (ref 3.5–5.1)
Sodium: 139 mmol/L (ref 135–145)
Total Bilirubin: 0.5 mg/dL (ref 0.3–1.2)
Total Protein: 7.6 g/dL (ref 6.5–8.1)

## 2018-12-09 LAB — TSH: TSH: 0.964 u[IU]/mL (ref 0.350–4.500)

## 2018-12-09 NOTE — ED Provider Notes (Signed)
[facility] Provider Note   CSN: [id number] Arrival date & time: 12/09/18  1627    History   Chief Complaint Chief Complaint  Patient presents with  . Hypertension    HPI Jodi Wade is a 58 y.o. female.     HPI Patient states that while she is at work she developed a left-sided headache which she states is common with her sinus disease.  She then became anxious and had a "panic attack".  She denies having any chest pain or shortness of breath at any point.  She then went outside and her sinuses started draining.  She felt much better.  EMS of the arrived and her blood pressure was elevated.  She was transported to the emergency department.  She currently denies any headache, visual changes, focal weakness or numbness.  Denies any nausea or vomiting.  Continues to have no chest pain or shortness of breath. Past Medical History:  Diagnosis Date  . Allergy   . Diabetes mellitus without complication ([place])   . Hypertension   . Thyroid disease     Patient Active Problem List   Diagnosis Date Noted  . Postablative hypothyroidism 04/22/2018  . Hyperlipemia 01/13/2016  . VITAMIN D DEFICIENCY 04/01/2008    Past Surgical History:  Procedure Laterality Date  . BREAST BIOPSY Right   . PARTIAL HYSTERECTOMY     abdominal     OB History   No obstetric history on file.      Home Medications    Prior to Admission medications   Medication Sig Start Date End Date Taking? Authorizing Provider  amLODipine (NORVASC) 10 MG tablet Take 10 mg by mouth daily.    [provider]  esomeprazole (NEXIUM) 40 MG capsule Take 40 mg by mouth daily as needed. TAKE 1 TABLET BY MOUTH ONCE DAILY AS NEEDED.    [provider]  levothyroxine (SYNTHROID) 88 MCG tablet Take 1 tablet (88 mcg total) by mouth daily. 10/22/18   Elayne Snare, MD  lisinopril (PRINIVIL,ZESTRIL) 20 MG tablet Take 20 mg by mouth daily.    [provider]  nabumetone  (RELAFEN) 500 MG tablet Take 500 mg by mouth 2 (two) times daily as needed. TAKE 1 TABLET BY MOUTH EVERY 12 HOURS AS NEEDED FOR KNEE PAIN.    [provider]  simvastatin (ZOCOR) 10 MG tablet Take 10 mg by mouth daily.    [provider]    Family History Family History  Problem Relation Age of Onset  . Cancer Mother   . Breast cancer Mother   . Thyroid disease Mother   . Diabetes Other   . Hypertension Other   . Thyroid disease Maternal Aunt   . Thyroid disease Son        Hyperthyroid, not treated    Social History Social History   Tobacco Use  . Smoking status: Never Smoker  . Smokeless tobacco: Never Used  Substance Use Topics  . Alcohol use: No  . Drug use: No     Allergies   Fexofenadine-pseudoephed er, Hydrochlorothiazide, Latex, and Pseudoephedrine   Review of Systems Review of Systems  Constitutional: Negative for chills and fever.  HENT: Negative for trouble swallowing.   Respiratory: Negative for cough and shortness of breath.   Cardiovascular: Negative for chest pain, palpitations and leg swelling.  Gastrointestinal: Negative for abdominal pain, constipation, diarrhea, nausea and vomiting.  Musculoskeletal: Negative for back pain, myalgias, neck pain and neck stiffness.  Skin: Negative for rash  and wound.  Neurological: Positive for headaches. Negative for dizziness, seizures, weakness, light-headedness and numbness.  Psychiatric/Behavioral: Negative for suicidal ideas. The patient is nervous/anxious.   All other systems reviewed and are negative.    Physical Exam Updated Vital Signs BP 128/83   Pulse 81   Temp 98.3 F (36.8 C) (Oral)   Resp 19   Ht 5\' 4"  (1.626 m)   Wt 93 kg   SpO2 99%   BMI 35.19 kg/m   Physical Exam Vitals signs and nursing note reviewed.  Constitutional:      General: She is not in acute distress.    Appearance: Normal appearance. She is well-developed. She is not ill-appearing.  HENT:     Head:  Normocephalic and atraumatic.  Eyes:     Extraocular Movements: Extraocular movements intact.     Pupils: Pupils are equal, round, and reactive to light.  Neck:     Musculoskeletal: Normal range of motion and neck supple.  Cardiovascular:     Rate and Rhythm: Normal rate and regular rhythm.     Heart sounds: No murmur. No friction rub. No gallop.   Pulmonary:     Effort: Pulmonary effort is normal. No respiratory distress.     Breath sounds: Normal breath sounds. No stridor. No wheezing, rhonchi or rales.  Chest:     Chest wall: No tenderness.  Abdominal:     General: Bowel sounds are normal.     Palpations: Abdomen is soft.     Tenderness: There is no abdominal tenderness. There is no guarding or rebound.  Musculoskeletal: Normal range of motion.        General: No swelling, tenderness, deformity or signs of injury.     Right lower leg: No edema.     Left lower leg: No edema.  Skin:    General: Skin is warm and dry.     Capillary Refill: Capillary refill takes less than 2 seconds.     Findings: No erythema or rash.  Neurological:     General: No focal deficit present.     Mental Status: She is alert and oriented to person, place, and time.     Comments: Patient is alert and oriented x3 with clear, goal oriented speech. Patient has 5/5 motor in all extremities. Sensation is intact to light touch. Bilateral finger-to-nose is normal with no signs of dysmetria. Patient has a normal gait and walks without assistance.  Psychiatric:        Mood and Affect: Mood normal.        Behavior: Behavior normal.      ED Treatments / Results  Labs (all labs ordered are listed, but only abnormal results are displayed) Labs Reviewed  CBC WITH DIFFERENTIAL/PLATELET - Abnormal; Notable for the following components:      Result Value   RBC 5.16 (*)    All other components within normal limits  COMPREHENSIVE METABOLIC PANEL - Abnormal; Notable for the following components:   Potassium 3.3 (*)     Glucose, Bld 106 (*)    All other components within normal limits  TSH    EKG EKG Interpretation  Date/Time:  Monday December 09 2018 16:57:09 EDT Ventricular Rate:  83 PR Interval:    QRS Duration: 90 QT Interval:  388 QTC Calculation: 456 R Axis:   48 Text Interpretation:  Sinus rhythm Confirmed by Julianne Rice [phone]) on 12/09/2018 5:21:26 PM   Radiology No results found.  Procedures Procedures (including critical care time)  Medications  Ordered in ED Medications - No data to display   Initial Impression / Assessment and Plan / ED Course  I have reviewed the triage vital signs and the nursing notes.  Pertinent labs & imaging results that were available during my care of the patient were reviewed by me and considered in my medical decision making (see chart for details).       Patient is currently symptom-free.  Normal neurologic exam.  Laboratory values grossly normal including TSH.  Blood pressure is normal.  Low suspicion for intracranial pathology hesitating imaging studies.  Advised to follow-up with her primary doctor.  Return precautions.  Final Clinical Impressions(s) / ED Diagnoses   Final diagnoses:  Hypertension, unspecified type  Acute nonintractable headache, unspecified headache type    ED Discharge Orders    None       Julianne Rice, MD 12/09/18 [phone]

## 2018-12-09 NOTE — ED Triage Notes (Signed)
Pt was at work her bp shot up and started having chest pains her chest pains are gone but bp is still elevated.

## 2018-12-11 ENCOUNTER — Telehealth: Payer: Self-pay | Admitting: Endocrinology

## 2018-12-11 NOTE — Telephone Encounter (Signed)
Called pt and left detailed voicemail with MD message. Also canceled upcoming lab appt.

## 2018-12-11 NOTE — Telephone Encounter (Signed)
Patient called to advise that she had blood drawn at [facility] on 12/09/2018 and wanted to know if those labs would work for her 12/23/18 appointment.  The ED pulled at TSH, CBC, and CMP.  She was hoping not to have to come to her lab appointment on 12/19/18 and those labs be used for her appointment with Dr Dwyane Dee.  Please advise patient at [phone] and let me know if I can cancel her lab appointment.

## 2018-12-11 NOTE — Telephone Encounter (Signed)
Please cancel lab appointment

## 2018-12-19 ENCOUNTER — Other Ambulatory Visit: Payer: No Typology Code available for payment source

## 2018-12-23 ENCOUNTER — Other Ambulatory Visit: Payer: Self-pay

## 2018-12-23 ENCOUNTER — Ambulatory Visit (INDEPENDENT_AMBULATORY_CARE_PROVIDER_SITE_OTHER): Payer: No Typology Code available for payment source | Admitting: Endocrinology

## 2018-12-23 ENCOUNTER — Encounter: Payer: Self-pay | Admitting: Endocrinology

## 2018-12-23 DIAGNOSIS — E890 Postprocedural hypothyroidism: Secondary | ICD-10-CM | POA: Diagnosis not present

## 2018-12-23 NOTE — Progress Notes (Signed)
Referring Physician: Kathyrn Lass  Reason for Appointment: Follow-up of thyroid   Today's office visit was provided via telemedicine using video technique Explained to the patient and the the limitations of evaluation and management by telemedicine and the availability of in person appointments.  The patient understood the limitations and agreed to proceed. Patient also understood that the telehealth visit is billable. . Location of the patient: Home . Location of the provider: Office Only the patient and myself were participating in the encounter     History of Present Illness:     Review of her record indicates that she had a toxic nodular goiter in 2009 although she thinks her only symptom at that time was fatigue and she does not remember any other symptoms like palpitations or weight loss Her scan showed heterogenous uptake within the thyroid and a 24 uptake of 32% She was treated with 29 mCi After this treatment she thinks she felt better with her fatigue  At some point subsequently patient was started on levothyroxine and she does not know whether she was having any symptoms at that time Her record indicates that she has been in the past taking between 50 up to 100 g of levothyroxine until 2018 However subsequently her thyroxine doses had been reduced and then tapered off because of her TSH being persistently low  Recent history:  Despite being off levothyroxine she had mild hyperthyroidism when first seen in 07/2017 although having no symptoms except weight gain She had mild T3 toxicosis  Thyroid scan showed heterogenous uptake, 24 uptake was 37%  She had radioactive iodine treatment with 30 mCi of I-131 on 09/14/2017  Follow-up thyroid levels in 7/19 showed that TSH had gone up to 63.8 She was started on thyroid supplementation Since her TSH was still high at 52 in 9/19 her dose was increased from 112 levothyroxine up to 200 mcg  With increasing the  thyroid supplement using 200 mcg levothyroxine she did feel fairly good and no symptoms of hyperthyroidism were occurring again However she did not understand the need for taking her medication regularly and had been out of her medications and she was seen in 04/2018 with marked increase in TSH She was also having complaints of fatigue and cold intolerance Previously on levothyroxine 200 mcg she felt better but her TSH was suppressed and free T4 significantly high at 2.55  Subsequently had been requiring lower doses of levothyroxine TSH being suppressed Now taking 88 mcg levothyroxine since 10/2018  She takes this regularly before breakfast  She has had no complaints of unusual fatigue, heat or cold intolerance or any significant weight change No palpitations   TSH is now not suppressed as before Free T4 was not checked at the lab done at the ER, previously normal   Patient's weight history is as follows:  Wt Readings from Last 3 Encounters:  12/09/18 205 lb (93 kg)  08/21/18 201 lb 12.8 oz (91.5 kg)  06/17/18 204 lb 9.6 oz (92.8 kg)    Thyroid function results have been as follows:  Outside labs: TSH in 11/2016 was 0.94, previously 0.02 in 3/18 and 7.9 in 02/2016  TSH 0.07 in 03/2017 and in 04/2017 free T4 was 0.82 and free T3 4.8   Lab Results  Component Value Date   TSH 0.964 12/09/2018   TSH 0.03 (L) 10/17/2018   TSH <0.01 (L) 08/16/2018   TSH 0.01 Repeated and verified X2. (L) 06/14/2018   FREET4 1.26 10/17/2018  FREET4 1.75 (H) 08/16/2018   FREET4 2.55 (H) 06/14/2018   FREET4 0.22 (L) 04/19/2018   T3FREE 2.6 12/10/2017   T3FREE 3.9 10/15/2017   T3FREE 4.4 (H) 08/01/2017     Past Medical History:  Diagnosis Date  . Allergy   . Diabetes mellitus without complication ([place])   . Hypertension   . Thyroid disease     Past Surgical History:  Procedure Laterality Date  . BREAST BIOPSY Right   . PARTIAL HYSTERECTOMY     abdominal    Family History  Problem  Relation Age of Onset  . Cancer Mother   . Breast cancer Mother   . Thyroid disease Mother   . Diabetes Other   . Hypertension Other   . Thyroid disease Maternal Aunt   . Thyroid disease Son        Hyperthyroid, not treated    Social History:  reports that she has never smoked. She has never used smokeless tobacco. She reports that she does not drink alcohol or use drugs.  Allergies:  Allergies  Allergen Reactions  . Fexofenadine-Pseudoephed Er     REACTION: fast heart rate  . Hydrochlorothiazide     REACTION: Hypokalemia  . Latex Hives  . Pseudoephedrine     REACTION: Heart Rate    Allergies as of 12/23/2018      Reactions   Fexofenadine-pseudoephed Er    REACTION: fast heart rate   Hydrochlorothiazide    REACTION: Hypokalemia   Latex Hives   Pseudoephedrine    REACTION: Heart Rate      Medication List       Accurate as of December 23, 2018  3:08 PM. If you have any questions, ask your nurse or doctor.        amLODipine 10 MG tablet Commonly known as: NORVASC Take 10 mg by mouth daily.   esomeprazole 40 MG capsule Commonly known as: NEXIUM Take 40 mg by mouth daily as needed. TAKE 1 TABLET BY MOUTH ONCE DAILY AS NEEDED.   levothyroxine 88 MCG tablet Commonly known as: SYNTHROID Take 1 tablet (88 mcg total) by mouth daily.   lisinopril 20 MG tablet Commonly known as: ZESTRIL Take 20 mg by mouth daily.   nabumetone 500 MG tablet Commonly known as: RELAFEN Take 500 mg by mouth 2 (two) times daily as needed. TAKE 1 TABLET BY MOUTH EVERY 12 HOURS AS NEEDED FOR KNEE PAIN.   simvastatin 10 MG tablet Commonly known as: ZOCOR Take 10 mg by mouth daily.          Review of Systems      Blood pressure controlled, followed by her PCP for hypertension  BP Readings from Last 3 Encounters:  12/09/18 119/73  08/21/18 140/80  06/17/18 110/74            Examination:    There were no vitals taken for this visit.  Exam not done, patient seen on remote  visit  Assessment:  History of TOXIC nodular goiter dating back to 2009 treated with I-131 twice, last in 09/2017  Post ablative HYPOTHYROIDISM:  She has had significant posttreatment hypothyroidism with TSH as high as 92  Although previously had required as much as 200 mcg of levothyroxine her dose subsequently has been lowered progressively and now is taking only 88 mcg Last dosage change was in 5/20 She is subjectively doing very well She understands the need to take levothyroxine long-term Currently with TSH being finally back to normal she may continue to require  the same dose now  PLAN:   She can continue 88 mcg of levothyroxine  She will take this before breakfast daily  Need to report any symptoms of fatigue or lethargy if she has any before her next visit  She will need another follow-up in 4 months  Continue follow-up with PCP for hypertension  Elayne Snare 12/23/2018, 3:08 PM      Note: This office note was prepared with Dragon voice recognition system technology. Any transcriptional errors that result from this process are unintentional.

## 2018-12-24 ENCOUNTER — Other Ambulatory Visit: Payer: Self-pay | Admitting: Endocrinology

## 2018-12-24 MED ORDER — LEVOTHYROXINE SODIUM 88 MCG PO TABS: 88.0000 ug | ORAL_TABLET | Freq: Every day | ORAL | 1 refills | Status: DC

## 2018-12-24 MED FILL — LEVOTHYROXINE 88 MCG TABLET: 88 | 90 days supply | Qty: 90 | Fill #0

## 2018-12-30 ENCOUNTER — Other Ambulatory Visit: Payer: Self-pay

## 2018-12-30 ENCOUNTER — Ambulatory Visit
Admission: RE | Admit: 2018-12-30 | Discharge: 2018-12-30 | Disposition: A | Discharge status: Home / Self Care | Payer: No Typology Code available for payment source | Source: Ambulatory Visit | Attending: Family Medicine | Admitting: Family Medicine

## 2018-12-30 DIAGNOSIS — Z1231 Encounter for screening mammogram for malignant neoplasm of breast: Secondary | ICD-10-CM

## 2019-03-14 MED FILL — ATORVASTATIN 20 MG TABLET: 20 | 90 days supply | Qty: 90 | Fill #1

## 2019-03-17 MED FILL — LISINOPRIL 20 MG TABLET: 20 | 90 days supply | Qty: 90 | Fill #0

## 2019-03-17 MED FILL — AMLODIPINE BESYLATE 10 MG T: 10 | 90 days supply | Qty: 90 | Fill #0

## 2019-03-27 MED FILL — LEVOTHYROXINE 88 MCG TABLET: 88 | 90 days supply | Qty: 90 | Fill #1

## 2019-04-25 ENCOUNTER — Other Ambulatory Visit (INDEPENDENT_AMBULATORY_CARE_PROVIDER_SITE_OTHER): Payer: No Typology Code available for payment source

## 2019-04-25 ENCOUNTER — Other Ambulatory Visit: Payer: Self-pay

## 2019-04-25 ENCOUNTER — Other Ambulatory Visit: Payer: No Typology Code available for payment source

## 2019-04-25 DIAGNOSIS — E890 Postprocedural hypothyroidism: Secondary | ICD-10-CM

## 2019-04-25 LAB — TSH: TSH: 1.16 u[IU]/mL (ref 0.35–4.50)

## 2019-04-25 LAB — T4, FREE: Free T4: 1.15 ng/dL (ref 0.60–1.60)

## 2019-04-29 ENCOUNTER — Encounter: Payer: Self-pay | Admitting: Endocrinology

## 2019-04-29 ENCOUNTER — Ambulatory Visit (INDEPENDENT_AMBULATORY_CARE_PROVIDER_SITE_OTHER): Payer: No Typology Code available for payment source | Admitting: Endocrinology

## 2019-04-29 ENCOUNTER — Other Ambulatory Visit: Payer: Self-pay

## 2019-04-29 DIAGNOSIS — E890 Postprocedural hypothyroidism: Secondary | ICD-10-CM

## 2019-04-29 NOTE — Progress Notes (Signed)
Referring Physician: Kathyrn Lass  Reason for Appointment: Follow-up of thyroid   Today's office visit was provided via telemedicine using video technique Explained to the patient and the the limitations of evaluation and management by telemedicine and the availability of in person appointments.  The patient understood the limitations and agreed to proceed. Patient also understood that the telehealth visit is billable. . Location of the patient: Home . Location of the provider: Office Only the patient and myself were participating in the encounter     History of Present Illness:     Review of her record indicates that she had a toxic nodular goiter in 2009 although she thinks her only symptom at that time was fatigue and she does not remember any other symptoms like palpitations or weight loss Her scan showed heterogenous uptake within the thyroid and a 24 uptake of 32% She was treated with 29 mCi After this treatment she thinks she felt better with her fatigue  At some point subsequently patient was started on levothyroxine and she does not know whether she was having any symptoms at that time Her record indicates that she has been in the past taking between 50 up to 100 g of levothyroxine until 2018 However subsequently her thyroxine doses had been reduced and then tapered off because of her TSH being persistently low Despite being off levothyroxine she had mild hyperthyroidism when first seen in 07/2017 although having no symptoms except weight gain She had mild T3 toxicosis  Thyroid scan showed heterogenous uptake, 24 uptake was 37%  She had radioactive iodine treatment with 30 mCi of I-131 on 09/14/2017  Follow-up thyroid levels in 7/19 showed that TSH had gone up to 63.8 She was started on thyroid supplementation Since her TSH was still high at 52 in 9/19 her dose was increased from 112 levothyroxine up to 200 mcg  With increasing the thyroid supplement using  200 mcg levothyroxine she did feel fairly good and no symptoms of hyperthyroidism were occurring again She had been out of her levothyroxine when she was seen in 04/2018 with marked increase in TSH She was also having complaints of fatigue and cold intolerance Previously on levothyroxine 200 mcg she felt better but her TSH was suppressed and free T4 significantly high at 2.55  Recent history: Subsequently had been requiring lower doses of levothyroxine TSH being suppressed Now taking 88 mcg levothyroxine since 10/2018  She takes this regularly before breakfast without any other supplements or food She takes her vitamins at lunchtime  She has felt well and does not complain of feeling tired.  No palpitations or any weight change noticed She also thinks that she feels better with adding multivitamins  TSH is now consistently normal and at 1.2 Free T4 normal   Patient's weight history is as follows:  Wt Readings from Last 3 Encounters:  12/09/18 205 lb (93 kg)  08/21/18 201 lb 12.8 oz (91.5 kg)  06/17/18 204 lb 9.6 oz (92.8 kg)    Thyroid function results have been as follows:    Lab Results  Component Value Date   TSH 1.16 04/25/2019   TSH 0.964 12/09/2018   TSH 0.03 (L) 10/17/2018   TSH <0.01 (L) 08/16/2018   FREET4 1.15 04/25/2019   FREET4 1.26 10/17/2018   FREET4 1.75 (H) 08/16/2018   FREET4 2.55 (H) 06/14/2018   T3FREE 2.6 12/10/2017   T3FREE 3.9 10/15/2017   T3FREE 4.4 (H) 08/01/2017     Past Medical History:  Diagnosis Date  . Allergy   . Diabetes mellitus without complication ([place])   . Hypertension   . Thyroid disease     Past Surgical History:  Procedure Laterality Date  . BREAST BIOPSY Right   . PARTIAL HYSTERECTOMY     abdominal    Family History  Problem Relation Age of Onset  . Cancer Mother   . Breast cancer Mother   . Thyroid disease Mother   . Diabetes Other   . Hypertension Other   . Thyroid disease Maternal Aunt   . Thyroid disease  Son        Hyperthyroid, not treated    Social History:  reports that she has never smoked. She has never used smokeless tobacco. She reports that she does not drink alcohol or use drugs.  Allergies:  Allergies  Allergen Reactions  . Fexofenadine-Pseudoephed Er     REACTION: fast heart rate  . Hydrochlorothiazide     REACTION: Hypokalemia  . Latex Hives  . Pseudoephedrine     REACTION: Heart Rate    Allergies as of 04/29/2019      Reactions   Fexofenadine-pseudoephed Er    REACTION: fast heart rate   Hydrochlorothiazide    REACTION: Hypokalemia   Latex Hives   Pseudoephedrine    REACTION: Heart Rate      Medication List       Accurate as of April 29, 2019  3:49 PM. If you have any questions, ask your nurse or doctor.        amLODipine 10 MG tablet Commonly known as: NORVASC Take 10 mg by mouth daily.   esomeprazole 40 MG capsule Commonly known as: NEXIUM Take 40 mg by mouth daily as needed. TAKE 1 TABLET BY MOUTH ONCE DAILY AS NEEDED.   levothyroxine 88 MCG tablet Commonly known as: SYNTHROID Take 1 tablet (88 mcg total) by mouth daily.   lisinopril 20 MG tablet Commonly known as: ZESTRIL Take 20 mg by mouth daily.   nabumetone 500 MG tablet Commonly known as: RELAFEN Take 500 mg by mouth 2 (two) times daily as needed. TAKE 1 TABLET BY MOUTH EVERY 12 HOURS AS NEEDED FOR KNEE PAIN.   simvastatin 10 MG tablet Commonly known as: ZOCOR Take 10 mg by mouth daily.          Review of Systems  She is followed by her PCP for hypertension  BP Readings from Last 3 Encounters:  12/09/18 119/73  08/21/18 140/80  06/17/18 110/74            Examination:    There were no vitals taken for this visit.  Exam not done, patient seen on remote visit  Assessment:  History of TOXIC nodular goiter dating back to 2009 treated with I-131 twice, last in 09/2017  Post ablative HYPOTHYROIDISM:  She had significant posttreatment hypothyroidism with TSH as  high as 92 after her treatment  Although previously had required as much as 200 mcg of levothyroxine her dose subsequently has been lowered progressively and now is taking a stable dose of 88 mcg Last dosage change was in 5/20 She does not complain of any unusual fatigue now She is taking her supplement consistently in the morning before breakfast  GOITER: This had improved in size after the radioactive iron treatment and has not had a follow-up exam  PLAN:   She will continue 88 mcg of levothyroxine daily in the morning as before  She will take this before breakfast daily  She will  let us know if she has any symptoms of fatigue or lethargy  before her next visit  She can now follow-up annually  Elayne Snare 04/29/2019, 3:49 PM      Note: This office note was prepared with Dragon voice recognition system technology. Any transcriptional errors that result from this process are unintentional.

## 2019-06-20 ENCOUNTER — Other Ambulatory Visit: Payer: Self-pay | Admitting: Endocrinology

## 2019-06-20 MED FILL — AMLODIPINE BESYLATE 10 MG T: 10 | 90 days supply | Qty: 90 | Fill #0

## 2019-06-20 MED FILL — LEVOTHYROXINE 88 MCG TABLET: 88 | 90 days supply | Qty: 90 | Fill #0

## 2019-06-20 MED FILL — LISINOPRIL 20 MG TABLET: 20 | 90 days supply | Qty: 90 | Fill #0

## 2019-06-20 MED FILL — ATORVASTATIN 20 MG TABLET: 20 | 90 days supply | Qty: 90 | Fill #0

## 2019-08-25 ENCOUNTER — Other Ambulatory Visit: Payer: Self-pay

## 2019-08-25 ENCOUNTER — Ambulatory Visit (INDEPENDENT_AMBULATORY_CARE_PROVIDER_SITE_OTHER): Payer: No Typology Code available for payment source | Admitting: Orthopedic Surgery

## 2019-08-25 ENCOUNTER — Encounter: Payer: Self-pay | Admitting: Orthopedic Surgery

## 2019-08-25 ENCOUNTER — Ambulatory Visit: Payer: No Typology Code available for payment source

## 2019-08-25 VITALS — BP 144/86 | HR 75 | Ht 64.0 in | Wt 212.0 lb

## 2019-08-25 DIAGNOSIS — G8929 Other chronic pain: Secondary | ICD-10-CM | POA: Diagnosis not present

## 2019-08-25 DIAGNOSIS — M25561 Pain in right knee: Secondary | ICD-10-CM

## 2019-08-25 NOTE — Progress Notes (Signed)
Jodi Wade  08/25/2019  Body mass index is 36.39 kg/m.   HISTORY SECTION :  Chief Complaint  Patient presents with  . Knee Pain    right knee painful has been seen in [facility] for it over a year ago    59 year old female presents for evaluation of painful right knee which began in 2019 when she drove to [place].  By the time she got there she had pain swelling and she could not walk.  She had to spend several days in the bed at which time she treated herself with ice heat and ibuprofen and was finally able to get up  When she got back she saw Dr. Lyla Glassing at [facility] and he did x-rays recommended some therapy for osteoarthritis  She now complains of intermittent pain appears to be related to the weather and would like that evaluated to see if things have gotten worse    Review of Systems  HENT: Positive for tinnitus.   Musculoskeletal: Positive for joint pain and myalgias.  Endo/Heme/Allergies: Positive for environmental allergies.     has a past medical history of Allergy, Diabetes mellitus without complication ([place]), Hypertension, and Thyroid disease.   Past Surgical History:  Procedure Laterality Date  . BREAST BIOPSY Right   . PARTIAL HYSTERECTOMY     abdominal    Body mass index is 36.39 kg/m.   Allergies  Allergen Reactions  . Fexofenadine-Pseudoephed Er     REACTION: fast heart rate  . Hydrochlorothiazide     REACTION: Hypokalemia  . Latex Hives  . Pseudoephedrine     REACTION: Heart Rate     Current Outpatient Medications:  .  amLODipine (NORVASC) 10 MG tablet, Take 10 mg by mouth daily., Disp: , Rfl:  .  atorvastatin (LIPITOR) 20 MG tablet, Take 20 mg by mouth daily., Disp: , Rfl:  .  levothyroxine (SYNTHROID) 88 MCG tablet, TAKE 1 TABLET (88 MCG TOTAL) BY MOUTH DAILY., Disp: 90 tablet, Rfl: 1 .  lisinopril (PRINIVIL,ZESTRIL) 20 MG tablet, Take 20 mg by mouth daily., Disp: , Rfl:  .  simvastatin (ZOCOR) 10 MG tablet, Take 10 mg by  mouth daily., Disp: , Rfl:    PHYSICAL EXAM SECTION: 1) BP (!) 144/86   Pulse 75   Ht 5\' 4"  (1.626 m)   Wt 212 lb (96.2 kg)   BMI 36.39 kg/m   Body mass index is 36.39 kg/m. General appearance: Well-developed well-nourished no gross deformities  2) Cardiovascular normal pulse and perfusion , normal color   3) Neurologically deep tendon reflexes are equal and normal, no sensation loss or deficits no pathologic reflexes  4) Psychological: Awake alert and oriented x3 mood and affect normal  5) Skin no lacerations or ulcerations no nodularity no palpable masses, no erythema or nodularity  6) Musculoskeletal:   Gait remains normal  Left knee range of motion 120 degrees no tenderness full strength is noted ligaments are stable skin is normal.  Right knee no effusion range of motion is only 105 degrees she is tender over the medial joint line.  Cruciate ligaments and collateral ligaments are stable at 0 and 30 degrees.     MEDICAL DECISION MAKING  A.  Encounter Diagnosis  Name Primary?  . Chronic pain of right knee Yes    B. DATA ANALYSED:  IMAGING: Independent interpretation of images: X-ray shows osteoarthritis medial compartment moderate there are some osteophytes alignment is still in valgus trending towards the varus position  Orders: No  new orders  Outside records reviewed: No outside records  C. MANAGEMENT  Recommend exercise  Weight loss  See [place] on a symptomatic basis    No orders of the defined types were placed in this encounter.     Arther Abbott, MD  08/25/2019 4:27 PM

## 2019-08-25 NOTE — Patient Instructions (Addendum)
Continue exercising and weight loss measures, see [place] when needed Osteoarthritis  Osteoarthritis is a type of arthritis that affects tissue that covers the ends of bones in joints (cartilage). Cartilage acts as a cushion between the bones and helps them move smoothly. Osteoarthritis results when cartilage in the joints gets worn down. Osteoarthritis is sometimes called "wear and tear" arthritis. Osteoarthritis is the most common form of arthritis. It often occurs in older people. It is a condition that gets worse over time (a progressive condition). Joints that are most often affected by this condition are in:  Fingers.  Toes.  Hips.  Knees.  Spine, including neck and lower back. What are the causes? This condition is caused by age-related wearing down of cartilage that covers the ends of bones. What increases the risk? The following factors may make you more likely to develop this condition:  Older age.  Being overweight or obese.  Overuse of joints, such as in athletes.  Past injury of a joint.  Past surgery on a joint.  Family history of osteoarthritis. What are the signs or symptoms? The main symptoms of this condition are pain, swelling, and stiffness in the joint. The joint may lose its shape over time. Small pieces of bone or cartilage may break off and float inside of the joint, which may cause more pain and damage to the joint. Small deposits of bone (osteophytes) may grow on the edges of the joint. Other symptoms may include:  A grating or scraping feeling inside the joint when you move it.  Popping or creaking sounds when you move. Symptoms may affect one or more joints. Osteoarthritis in a major joint, such as your knee or hip, can make it painful to walk or exercise. If you have osteoarthritis in your hands, you might not be able to grip items, twist your hand, or control small movements of your hands and fingers (fine motor skills). How is this diagnosed? This  condition may be diagnosed based on:  Your medical history.  A physical exam.  Your symptoms.  X-rays of the affected joint(s).  Blood tests to rule out other types of arthritis. How is this treated? There is no cure for this condition, but treatment can help to control pain and improve joint function. Treatment plans may include:  A prescribed exercise program that allows for rest and joint relief. You may work with a physical therapist.  A weight control plan.  Pain relief techniques, such as: ? Applying heat and cold to the joint. ? Electric pulses delivered to nerve endings under the skin (transcutaneous electrical nerve stimulation, or TENS). ? Massage. ? Certain nutritional supplements.  NSAIDs or prescription medicines to help relieve pain.  Medicine to help relieve pain and inflammation (corticosteroids). This can be given by mouth (orally) or as an injection.  Assistive devices, such as a brace, wrap, splint, specialized glove, or cane.  Surgery, such as: ? An osteotomy. This is done to reposition the bones and relieve pain or to remove loose pieces of bone and cartilage. ? Joint replacement surgery. You may need this surgery if you have very bad (advanced) osteoarthritis. Follow these instructions at home: Activity  Rest your affected joints as directed by your health care provider.  Do not drive or use heavy machinery while taking prescription pain medicine.  Exercise as directed. Your health care provider or physical therapist may recommend specific types of exercise, such as: ? Strengthening exercises. These are done to strengthen the muscles that support  joints that are affected by arthritis. They can be performed with weights or with exercise bands to add resistance. ? Aerobic activities. These are exercises, such as brisk walking or water aerobics, that get your heart pumping. ? Range-of-motion activities. These keep your joints easy to move. ? Balance and  agility exercises. Managing pain, stiffness, and swelling      If directed, apply heat to the affected area as often as told by your health care provider. Use the heat source that your health care provider recommends, such as a moist heat pack or a heating pad. ? If you have a removable assistive device, remove it as told by your health care provider. ? Place a towel between your skin and the heat source. If your health care provider tells you to keep the assistive device on while you apply heat, place a towel between the assistive device and the heat source. ? Leave the heat on for 20-30 minutes. ? Remove the heat if your skin turns bright red. This is especially important if you are unable to feel pain, heat, or cold. You may have a greater risk of getting burned.  If directed, put ice on the affected joint: ? If you have a removable assistive device, remove it as told by your health care provider. ? Put ice in a plastic bag. ? Place a towel between your skin and the bag. If your health care provider tells you to keep the assistive device on during icing, place a towel between the assistive device and the bag. ? Leave the ice on for 20 minutes, 2-3 times a day. General instructions  Take over-the-counter and prescription medicines only as told by your health care provider.  Maintain a healthy weight. Follow instructions from your health care provider for weight control. These may include dietary restrictions.  Do not use any products that contain nicotine or tobacco, such as cigarettes and e-cigarettes. These can delay bone healing. If you need help quitting, ask your health care provider.  Use assistive devices as directed by your health care provider.  Keep all follow-up visits as told by your health care provider. This is important. Where to find more information  [employer] of Arthritis and Musculoskeletal and Skin Diseases: www.niams.SouthExposed.es  [employer] on  Aging: http://kim-miller.com/  American College of Rheumatology: www.rheumatology.org Contact a health care provider if:  Your skin turns red.  You develop a rash.  You have pain that gets worse.  You have a fever along with joint or muscle aches. Get help right away if:  You lose a lot of weight.  You suddenly lose your appetite.  You have night sweats. Summary  Osteoarthritis is a type of arthritis that affects tissue covering the ends of bones in joints (cartilage).  This condition is caused by age-related wearing down of cartilage that covers the ends of bones.  The main symptom of this condition is pain, swelling, and stiffness in the joint.  There is no cure for this condition, but treatment can help to control pain and improve joint function. This information is not intended to replace advice given to you by your health care provider. Make sure you discuss any questions you have with your health care provider. Document Revised: 05/04/2017 Document Reviewed: 01/24/2016 Elsevier Patient Education  2020 [employer].

## 2019-09-12 ENCOUNTER — Other Ambulatory Visit (HOSPITAL_COMMUNITY): Payer: Self-pay | Admitting: Physician Assistant

## 2019-09-12 MED FILL — ATORVASTATIN 20 MG TABLET: 20 | 90 days supply | Qty: 90 | Fill #0

## 2019-09-12 MED FILL — AMLODIPINE BESYLATE 10 MG T: 10 | 90 days supply | Qty: 90 | Fill #0

## 2019-09-12 MED FILL — LISINOPRIL 20 MG TABLET: 20 | 90 days supply | Qty: 90 | Fill #0

## 2019-09-15 MED FILL — LEVOTHYROXINE 88 MCG TABLET: 88 | 90 days supply | Qty: 90 | Fill #1

## 2019-09-16 ENCOUNTER — Other Ambulatory Visit: Payer: Self-pay | Admitting: Orthopedic Surgery

## 2019-09-16 ENCOUNTER — Other Ambulatory Visit: Payer: Self-pay

## 2019-09-16 MED ORDER — IBUPROFEN 800 MG PO TABS: 800.0000 mg | ORAL_TABLET | Freq: Three times a day (TID) | ORAL | 5 refills | Status: DC | PRN

## 2019-09-16 MED FILL — IBUPROFEN 800 MG TABS: 800 | 30 days supply | Qty: 90 | Fill #0

## 2019-09-16 NOTE — Telephone Encounter (Signed)
Patient is asking if she could get a prescription for Ibuprofen 800 MG.  PATIENT USES [facility]

## 2019-11-13 ENCOUNTER — Telehealth: Payer: Self-pay | Admitting: Orthopedic Surgery

## 2019-11-13 ENCOUNTER — Other Ambulatory Visit: Payer: Self-pay | Admitting: Orthopedic Surgery

## 2019-11-13 MED ORDER — MELOXICAM 7.5 MG PO TABS: 7.5000 mg | ORAL_TABLET | Freq: Every day | ORAL | 5 refills | Status: DC

## 2019-11-13 NOTE — Telephone Encounter (Signed)
error 

## 2019-11-13 NOTE — Telephone Encounter (Signed)
Patient requests some kind of anti-inflammatory.  She said she has been taking Ibuprofen but it doesn't seem to help on rainy, cool days like today.  She wants Dr. Aline Brochure to prescribe an anti-inflammatory.  She has scheduled an appointment for the 16th.  She uses [facility]

## 2019-11-19 ENCOUNTER — Encounter: Payer: Self-pay | Admitting: Orthopedic Surgery

## 2019-11-19 ENCOUNTER — Ambulatory Visit: Payer: No Typology Code available for payment source | Admitting: Orthopedic Surgery

## 2019-11-19 ENCOUNTER — Other Ambulatory Visit: Payer: Self-pay

## 2019-11-19 VITALS — BP 169/104 | HR 77 | Ht 64.0 in | Wt 210.0 lb

## 2019-11-19 DIAGNOSIS — G8929 Other chronic pain: Secondary | ICD-10-CM | POA: Diagnosis not present

## 2019-11-19 DIAGNOSIS — M25561 Pain in right knee: Secondary | ICD-10-CM

## 2019-11-19 NOTE — Patient Instructions (Addendum)
May need surgery    Knee Arthroscopy Knee arthroscopy is a surgery to examine the inside of the knee joint and repair any damage to cartilage, surfaces, and other soft tissues around the joint. You may have this surgery if non-surgical treatment has not relieved your symptoms. Knee arthroscopy may be used to:  Repair a torn ligament or other torn tissues. Ligaments are tissues that connect bones to each other.  Remove bone fragments.  Remove a fluid-filled sac (cyst).  Treat kneecap (patella)problems.  Treat an advanced infection in the knee (septicknee). Arthroscopic surgery is done using a thin tube that has a light and camera on the end of it (arthroscope). The arthroscope is placed through a small incision, and the camera sends images to a screen in the operating room. The images are used to help perform the surgery. Tell a health care provider about:  Any allergies you have.  All medicines you are taking, including vitamins, herbs, eye drops, creams, and over-the-counter medicines.  Any problems you or family members have had with anesthetic medicines.  Any blood disorders you have.  Any surgeries you have had.  Any medical conditions you have.  Whether you are pregnant or may be pregnant. What are the risks? Generally, this is a safe procedure. However, problems may occur, including:  Infection.  Bleeding.  Allergic reactions to medicines.  Damage to blood vessels, nerves, or tissues in the knee.  A blood clot that forms in the leg and travels to the lung (pulmonary embolism).  Failure of the surgery to relieve symptoms.  Knee stiffness. What happens before the procedure? Staying hydrated Follow instructions from your health care provider about hydration, which may include:  Up to 2 hours before the procedure - you may continue to drink clear liquids, such as water, clear fruit juice, black coffee, and plain tea. Eating and drinking restrictions Follow  instructions from your health care provider about eating and drinking, which may include:  8 hours before the procedure - stop eating heavy meals or foods such as meat, fried foods, or fatty foods.  6 hours before the procedure - stop eating light meals or foods, such as toast or cereal.  6 hours before the procedure - stop drinking milk or drinks that contain milk.  2 hours before the procedure - stop drinking clear liquids. Medicines Ask your health care provider about:  Changing or stopping your regular medicines. This is especially important if you are taking diabetes medicines or blood thinners.  Taking medicines such as aspirin and ibuprofen. These medicines can thin your blood. Do not take these medicines unless your health care provider tells you to take them.  Taking over-the-counter medicines, vitamins, herbs, and supplements. Testing  Your knee may be examined. Your health care provider may move your knee or ask you to move it in specific ways to see how much motion you have.  You may have blood tests.  You may have imaging tests, such as an X-ray, MRI, or CT scan.  You may have an electrocardiogram. This test records the electrical activity in the heart. General instructions  Do not drink alcohol unless your health care provider says that you can.  Do not use any products that contain nicotine or tobacco, such as cigarettes and e-cigarettes, for one month or more before your surgery. If you need help quitting, ask your health care provider.  Plan to have someone take you home from the hospital or clinic.  Plan to have a responsible  adult care for you for at least 24 hours after you leave the hospital or clinic. This is important. What happens during the procedure?   To lower your risk of infection: ? Your health care team will wash or sanitize their hands. ? Hair may be removed from the surgical area. ? Your skin will be washed with soap.  An IV will be inserted  into one of your veins.  You will be given one or more of the following: ? A medicine to help you relax (sedative). ? A medicine to numb the knee area (local anesthetic). ? A medicine to make you fall asleep (general anesthetic). ? A medicine that is injected into an area of your body to numb everything below the injection site (regional anesthetic). This may be injected into your groin or thigh.  A cuff may be placed around your upper leg to slow blood flow to your lower leg during the procedure.  Several small incisions will be made around your knee.  Your knee joint will be rinsed (flushed) and filled with a germ-free solution (sterile saline). This expands the area to allow your surgeon to see the joint more clearly.  An arthroscope will be passed through one of your incisions, into your knee joint.  Other surgical instruments will be passed through the other incisions. Then, your surgeon will examine and repair your knee as needed.  The sterile saline will be drained from your knee, and the cuff will be removed from your upper leg.  Your incisions will be closed with adhesive strips or stitches (sutures) and covered with a bandage (dressing). The procedure may vary among health care providers and hospitals. What happens after the procedure?  Your blood pressure, heart rate, breathing rate, and blood oxygen level will be monitored until the medicines you were given have worn off.  You will be given pain medicine as needed.  You may be given medicine to lower your risk of blood clots.  You may have to wear compression stockings. These stockings help to prevent blood clots and reduce swelling in your legs.  Your health care provider will give you instructions about how much body weight you can safely support on your leg (weight-bearing restrictions). You may be given crutches or other devices to help you move around (assistive devices).  You may be shown how to do physical therapy  exercises to help you recover.  Do not drive until your health care provider approves. Summary  Knee arthroscopy is a surgery to examine or repair the inside of the knee joint.  Before the procedure, follow instructions from your health care provider about eating and drinking.  Plan to have someone take you home from the hospital or clinic. This information is not intended to replace advice given to you by your health care provider. Make sure you discuss any questions you have with your health care provider. Document Revised: 05/04/2017 Document Reviewed: 02/22/2017 Elsevier Patient Education  2020 [employer].

## 2019-11-19 NOTE — Progress Notes (Signed)
Chief Complaint  Patient presents with  . Knee Pain    increased right knee pain x past week / declines injection states Meloxicam helping  . wants MRI    has pain with twisting/ requesting MRI of right knee     Last visit   Chief Complaint  Patient presents with  . Knee Pain      right knee painful has been seen in [facility] for it over a year ago     59 year old female presents for evaluation of painful right knee which began in 2019 when she drove to [place].  By the time she got there she had pain swelling and she could not walk.  She had to spend several days in the bed at which time she treated herself with ice heat and ibuprofen and was finally able to get up   When she got back she saw Dr. Lyla Glassing at [facility] and he did x-rays recommended some therapy for osteoarthritis   She now complains of intermittent pain appears to be related to the weather and would like that evaluated to see if things have gotten worse  Past Medical History:  Diagnosis Date  . Allergy   . Diabetes mellitus without complication ([place])   . Hypertension   . Thyroid disease      This visit  C/o pain swelling locking   Reexamination of the right knee shows that she has medial joint line tenderness a small amount of fluid in the joint she can extend the knee fully and she has pain at terminal flexion at 115 degrees, the knee otherwise feels stable  Impression torn medial meniscus right knee  Recommend MRI right knee

## 2019-11-28 ENCOUNTER — Other Ambulatory Visit: Payer: Self-pay | Admitting: Family Medicine

## 2019-11-28 DIAGNOSIS — Z1231 Encounter for screening mammogram for malignant neoplasm of breast: Secondary | ICD-10-CM

## 2019-11-30 ENCOUNTER — Other Ambulatory Visit: Payer: Self-pay

## 2019-11-30 ENCOUNTER — Ambulatory Visit (HOSPITAL_COMMUNITY)
Admission: RE | Admit: 2019-11-30 | Discharge: 2019-11-30 | Disposition: A | Discharge status: Home / Self Care | Payer: No Typology Code available for payment source | Source: Ambulatory Visit | Attending: Orthopedic Surgery | Admitting: Orthopedic Surgery

## 2019-11-30 DIAGNOSIS — M25561 Pain in right knee: Secondary | ICD-10-CM | POA: Insufficient documentation

## 2019-11-30 DIAGNOSIS — G8929 Other chronic pain: Secondary | ICD-10-CM | POA: Insufficient documentation

## 2019-12-01 ENCOUNTER — Telehealth: Payer: Self-pay | Admitting: Orthopedic Surgery

## 2019-12-01 NOTE — Telephone Encounter (Signed)
Message left patient has torn meniscus laterally and also has arthritis of the joint.  I told her that we can do arthroscopic surgery but to call us back and let us know that she 1 has gotten the message into that she wants to to proceed with surgery, I will have to talk to her if she decides to do surgery for preop counseling

## 2019-12-02 ENCOUNTER — Telehealth: Payer: Self-pay | Admitting: Radiology

## 2019-12-02 NOTE — Telephone Encounter (Signed)
Patient wants Aug 10th she will call back after she discusses with her employer

## 2019-12-02 NOTE — Telephone Encounter (Addendum)
left message for her to call me back to schedule  ? July 15th

## 2019-12-02 NOTE — Telephone Encounter (Signed)
Done

## 2019-12-02 NOTE — Telephone Encounter (Signed)
-----   Message from Carole Civil, MD sent at 12/01/2019  3:39 PM EDT ----- Call Indiya when u get a chance   She needs arthroscopy right knee and medial menisectomy

## 2019-12-03 NOTE — Telephone Encounter (Signed)
Patient called back to notify that August 10th will be okay for her surgery date as discussed.  Also asked if there would be any chance that physical therapy may help, prior to surgery?  Aware she may need to do after surgery.

## 2019-12-04 ENCOUNTER — Other Ambulatory Visit: Payer: Self-pay | Admitting: Orthopedic Surgery

## 2019-12-04 NOTE — Telephone Encounter (Signed)
Called her to advise August 10th

## 2019-12-04 NOTE — Telephone Encounter (Signed)
Pt is asking for a return call regarding her surgery date.

## 2019-12-16 ENCOUNTER — Telehealth: Payer: Self-pay | Admitting: Orthopedic Surgery

## 2019-12-16 NOTE — Telephone Encounter (Signed)
Called patient re: forms received in fax from [employer]. Left message for patient to return call to discuss forms protocol.

## 2019-12-19 ENCOUNTER — Other Ambulatory Visit: Payer: Self-pay | Admitting: Endocrinology

## 2019-12-19 ENCOUNTER — Other Ambulatory Visit: Payer: Self-pay

## 2019-12-19 ENCOUNTER — Telehealth: Payer: Self-pay | Admitting: Orthopedic Surgery

## 2019-12-19 ENCOUNTER — Telehealth: Payer: Self-pay | Admitting: Endocrinology

## 2019-12-19 MED ORDER — LEVOTHYROXINE SODIUM 88 MCG PO TABS: 88.0000 ug | ORAL_TABLET | Freq: Every day | ORAL | 1 refills | Status: DC

## 2019-12-19 MED FILL — LEVOTHYROXINE 88 MCG TABLET: 88 | 90 days supply | Qty: 90 | Fill #0

## 2019-12-19 NOTE — Telephone Encounter (Signed)
Medication Refill Request   Did you call your pharmacy and request this refill first? No, has been advised   If patient has not contacted pharmacy first, instruct them to do so for future refills.   Remind them that contacting the pharmacy for their refill is the quickest method to get the refill.   Refill policy also stated that it will take anywhere between 24-72 hours to receive the refill.    Name of medication? Levothryoxine  Is this a 90 day supply? unknown  Name and location of pharmacy? [facility] on [address]

## 2019-12-19 NOTE — Telephone Encounter (Signed)
Patient aware (called following receipt of forms in fax from [employer]) - to bring fee and complete [employer] forms per protocol.

## 2019-12-19 NOTE — Telephone Encounter (Signed)
Rx sent 

## 2019-12-22 MED FILL — LISINOPRIL 20 MG TABLET: 20 | 90 days supply | Qty: 90 | Fill #1

## 2019-12-22 MED FILL — AMLODIPINE BESYLATE 10 MG T: 10 | 90 days supply | Qty: 90 | Fill #1

## 2019-12-22 MED FILL — ATORVASTATIN 20 MG TABLET: 20 | 90 days supply | Qty: 90 | Fill #1

## 2019-12-24 ENCOUNTER — Encounter: Payer: Self-pay | Admitting: Orthopedic Surgery

## 2019-12-31 ENCOUNTER — Telehealth: Payer: Self-pay | Admitting: Gastroenterology

## 2019-12-31 NOTE — Telephone Encounter (Signed)
Pt returned phone call and stated the phone number to PCP is [phone] ask for RN Jeani Hawking

## 2019-12-31 NOTE — Telephone Encounter (Signed)
LVM for patient to call back to let her know Dr. Rush Landmark has reviewed records and to make her aware of recommendations. Going to get the PCP office number from her so I can follow up with PCP to see why they are wanting her to come in. Will try to reach patient again.

## 2019-12-31 NOTE — Telephone Encounter (Signed)
Caryl Pina, I have reviewed the colonoscopy from 2013. She had hyperplastic polyps. Based on current guidelines the 10-year follow-up will be the most reasonable. However if the PCP feels differently she certainly can let us know what the indication is that she feels the patient needs an earlier colonoscopy. If the patient's insurance covers a colonoscopy, we certainly can do it earlier as well. She can be seen in clinic as well. Please find out from the PCP office in the reasoning for early colonoscopy. Thanks. GM

## 2019-12-31 NOTE — Telephone Encounter (Signed)
Hey Dr. Rush Landmark, this patient is being referred to [place] for a colonoscopy. I spoke with patient a couple weeks ago to make her aware that her recall is 2023. I asked her if she was having any symptoms and she stated no that she was fine and was okay to wait until 2023. She states her PCP has called her several times pushing her to get colonoscopy. And she was not sure why she needed to have it done a few years early. She called me again today stating that her PCP called again and wanted a doctor to review her previous colonoscopy and to make sure she was due in 2023. Patient is asking if you would review colonoscopy and advise when she would be due. Thank you!

## 2020-01-01 ENCOUNTER — Ambulatory Visit
Admission: RE | Admit: 2020-01-01 | Discharge: 2020-01-01 | Disposition: A | Discharge status: Home / Self Care | Payer: No Typology Code available for payment source | Source: Ambulatory Visit | Attending: Family Medicine | Admitting: Family Medicine

## 2020-01-01 ENCOUNTER — Other Ambulatory Visit: Payer: Self-pay

## 2020-01-01 DIAGNOSIS — Z1231 Encounter for screening mammogram for malignant neoplasm of breast: Secondary | ICD-10-CM

## 2020-01-06 ENCOUNTER — Other Ambulatory Visit: Payer: Self-pay | Admitting: Orthopedic Surgery

## 2020-01-06 ENCOUNTER — Telehealth: Payer: Self-pay | Admitting: Orthopedic Surgery

## 2020-01-06 NOTE — Telephone Encounter (Signed)
Called her case is approved auth [id number]  Good 8/102021 through 02/12/2020

## 2020-01-06 NOTE — Telephone Encounter (Signed)
Amber from [employer] asks that you call her regarding pre-authorization for this patient's surgery on 01/13/20.  Amber's phone number is [phone]  Thanks

## 2020-01-07 NOTE — Patient Instructions (Signed)
Jodi Wade  01/07/2020     @PREFPERIOPPHARMACY @   Your procedure is scheduled on  01/13/2020   Report to [facility] at  [place].  Call this number if you have problems the morning of surgery:  [phone]   Remember:  Do not eat or drink after midnight.                       Take these medicines the morning of surgery with A SIP OF WATER  Amlodipine, levothyroxine, meloxicam(if needed).    Do not wear jewelry, make-up or nail polish.  Do not wear lotions, powders, or perfumes. Please wear deodorant and brush your teeth.  Do not shave 48 hours prior to surgery.  Men may shave face and neck.  Do not bring valuables to the hospital.  [facility] is not responsible for any belongings or valuables.  Contacts, dentures or bridgework may not be worn into surgery.  Leave your suitcase in the car.  After surgery it may be brought to your room.  For patients admitted to the hospital, discharge time will be determined by your treatment team.  Patients discharged the day of surgery will not be allowed to drive home.   Name and phone number of your driver:   family Special instructions:  DO NOT smoke the morning of your procedure.  Please read over the following fact sheets that you were given. Anesthesia Post-op Instructions and Care and Recovery After Surgery       Arthroscopic Knee Ligament Repair, Care After This sheet gives you information about how to care for yourself after your procedure. Your health care provider may also give you more specific instructions. If you have problems or questions, contact your health care provider. What can I expect after the procedure? After the procedure, it is common to have:  Pain in your knee.  Bruising and swelling on your knee, calf, and ankle for 3-4 days.  Fatigue. Follow these instructions at home: If you have a brace or immobilizer:  Wear the brace or immobilizer as told by your health care provider. Remove  it only as told by your health care provider.  Loosen the splint or immobilizer if your toes tingle, become numb, or turn cold and blue.  Keep the brace or immobilizer clean. Bathing  Do not take baths, swim, or use a hot tub until your health care provider approves. Ask your health care provider if you can take showers.  Keep your bandage (dressing) dry until your health care provider says that it can be removed. Cover it and your brace or immobilizer with a watertight covering when you take a shower. Incision care   Follow instructions from your health care provider about how to take care of your incision. Make sure you: ? Wash your hands with soap and water before you change your bandage (dressing). If soap and water are not available, use hand sanitizer. ? Change your dressing as told by your health care provider. ? Leave stitches (sutures), skin glue, or adhesive strips in place. These skin closures may need to stay in place for 2 weeks or longer. If adhesive strip edges start to loosen and curl up, you may trim the loose edges. Do not remove adhesive strips completely unless your health care provider tells you to do that.  Check your incision area every day for signs of infection. Check for: ? More redness, swelling,  or pain. ? More fluid or blood. ? Warmth. ? Pus or a bad smell. Managing pain, stiffness, and swelling   If directed, put ice on the affected area. ? If you have a removable brace or immobilizer, remove it as told by your health care provider. ? Put ice in a plastic bag. ? Place a towel between your skin and the bag or between your brace or immobilizer and the bag. ? Leave the ice on for 20 minutes, 2-3 times a day.  Move your toes often to avoid stiffness and to lessen swelling.  Raise (elevate) the injured area above the level of your heart while you are sitting or lying down. Driving  Do not drive until your health care provider approves. If you have a brace  or immobilizer on your leg, ask your health care provider when it is safe for you to drive.  Do not drive or use heavy machinery while taking prescription pain medicine. Activity  Rest as directed. Ask your health care provider what activities are safe for you.  Do physical therapy exercises as told by your health care provider. Physical therapy will help you regain strength and motion in your knee.  Follow instructions from your health care provider about: ? When you may start motion exercises. ? When you may start riding a stationary bike and doing other low-impact activities. ? When you may start to jog and do other high-impact activities. Safety  Do not use the injured limb to support your body weight until your health care provider says that you can. Use crutches as told by your health care provider. General instructions  Do not use any products that contain nicotine or tobacco, such as cigarettes and e-cigarettes. These can delay bone healing. If you need help quitting, ask your health care provider.  To prevent or treat constipation while you are taking prescription pain medicine, your health care provider may recommend that you: ? Drink enough fluid to keep your urine clear or pale yellow. ? Take over-the-counter or prescription medicines. ? Eat foods that are high in fiber, such as fresh fruits and vegetables, whole grains, and beans. ? Limit foods that are high in fat and processed sugars, such as fried and sweet foods.  Take over-the-counter and prescription medicines only as told by your health care provider.  Keep all follow-up visits as told by your health care provider. This is important. Contact a health care provider if:  You have more redness, swelling, or pain around an incision.  You have more fluid or blood coming from an incision.  Your incision feels warm to the touch.  You have a fever.  You have pain or swelling in your knee, and it gets worse.  You  have pain that does not get better with medicine. Get help right away if:  You have trouble breathing.  You have pus or a bad smell coming from an incision.  You have numbness and tingling near the knee joint. Summary  After the procedure, it is common to have knee pain with bruising and swelling on your knee, calf, and ankle.  Icing your knee and raising your leg above the level of your heart will help control the pain and the swelling.  Do physical therapy exercises as told by your health care provider. Physical therapy will help you regain strength and motion in your knee. This information is not intended to replace advice given to you by your health care provider. Make sure you discuss  any questions you have with your health care provider. Document Revised: 05/04/2017 Document Reviewed: 05/16/2016 Elsevier Patient Education  2020 [place] Anesthesia, Adult, Care After This sheet gives you information about how to care for yourself after your procedure. Your health care provider may also give you more specific instructions. If you have problems or questions, contact your health care provider. What can I expect after the procedure? After the procedure, the following side effects are common:  Pain or discomfort at the IV site.  Nausea.  Vomiting.  Sore throat.  Trouble concentrating.  Feeling cold or chills.  Weak or tired.  Sleepiness and fatigue.  Soreness and body aches. These side effects can affect parts of the body that were not involved in surgery. Follow these instructions at home:  For at least 24 hours after the procedure:  Have a responsible adult stay with you. It is important to have someone help care for you until you are awake and alert.  Rest as needed.  Do not: ? Participate in activities in which you could fall or become injured. ? Drive. ? Use heavy machinery. ? Drink alcohol. ? Take sleeping pills or medicines that cause  drowsiness. ? Make important decisions or sign legal documents. ? Take care of children on your own. Eating and drinking  Follow any instructions from your health care provider about eating or drinking restrictions.  When you feel hungry, start by eating small amounts of foods that are soft and easy to digest (bland), such as toast. Gradually return to your regular diet.  Drink enough fluid to keep your urine pale yellow.  If you vomit, rehydrate by drinking water, juice, or clear broth. General instructions  If you have sleep apnea, surgery and certain medicines can increase your risk for breathing problems. Follow instructions from your health care provider about wearing your sleep device: ? Anytime you are sleeping, including during daytime naps. ? While taking prescription pain medicines, sleeping medicines, or medicines that make you drowsy.  Return to your normal activities as told by your health care provider. Ask your health care provider what activities are safe for you.  Take over-the-counter and prescription medicines only as told by your health care provider.  If you smoke, do not smoke without supervision.  Keep all follow-up visits as told by your health care provider. This is important. Contact a health care provider if:  You have nausea or vomiting that does not get better with medicine.  You cannot eat or drink without vomiting.  You have pain that does not get better with medicine.  You are unable to pass urine.  You develop a skin rash.  You have a fever.  You have redness around your IV site that gets worse. Get help right away if:  You have difficulty breathing.  You have chest pain.  You have blood in your urine or stool, or you vomit blood. Summary  After the procedure, it is common to have a sore throat or nausea. It is also common to feel tired.  Have a responsible adult stay with you for the first 24 hours after general anesthesia. It is  important to have someone help care for you until you are awake and alert.  When you feel hungry, start by eating small amounts of foods that are soft and easy to digest (bland), such as toast. Gradually return to your regular diet.  Drink enough fluid to keep your urine pale yellow.  Return to your  normal activities as told by your health care provider. Ask your health care provider what activities are safe for you. This information is not intended to replace advice given to you by your health care provider. Make sure you discuss any questions you have with your health care provider. Document Revised: 05/25/2017 Document Reviewed: 01/05/2017 Elsevier Patient Education  [place]. How to Use Chlorhexidine for Bathing Chlorhexidine gluconate (CHG) is a germ-killing (antiseptic) solution that is used to clean the skin. It can get rid of the bacteria that normally live on the skin and can keep them away for about 24 hours. To clean your skin with CHG, you may be given:  A CHG solution to use in the shower or as part of a sponge bath.  A prepackaged cloth that contains CHG. Cleaning your skin with CHG may help lower the risk for infection:  While you are staying in the intensive care unit of the hospital.  If you have a vascular access, such as a central line, to provide short-term or long-term access to your veins.  If you have a catheter to drain urine from your bladder.  If you are on a ventilator. A ventilator is a machine that helps you breathe by moving air in and out of your lungs.  After surgery. What are the risks? Risks of using CHG include:  A skin reaction.  Hearing loss, if CHG gets in your ears.  Eye injury, if CHG gets in your eyes and is not rinsed out.  The CHG product catching fire. Make sure that you avoid smoking and flames after applying CHG to your skin. Do not use CHG:  If you have a chlorhexidine allergy or have previously reacted to  chlorhexidine.  On babies younger than 19 months of age. How to use CHG solution  Use CHG only as told by your health care provider, and follow the instructions on the label.  Use the full amount of CHG as directed. Usually, this is one bottle. During a shower Follow these steps when using CHG solution during a shower (unless your health care provider gives you different instructions): 1. Start the shower. 2. Use your normal soap and shampoo to wash your face and hair. 3. Turn off the shower or move out of the shower stream. 4. Pour the CHG onto a clean washcloth. Do not use any type of brush or rough-edged sponge. 5. Starting at your neck, lather your body down to your toes. Make sure you follow these instructions: ? If you will be having surgery, pay special attention to the part of your body where you will be having surgery. Scrub this area for at least 1 minute. ? Do not use CHG on your head or face. If the solution gets into your ears or eyes, rinse them well with water. ? Avoid your genital area. ? Avoid any areas of skin that have broken skin, cuts, or scrapes. ? Scrub your back and under your arms. Make sure to wash skin folds. 6. Let the lather sit on your skin for 1-2 minutes or as long as told by your health care provider. 7. Thoroughly rinse your entire body in the shower. Make sure that all body creases and crevices are rinsed well. 8. Dry off with a clean towel. Do not put any substances on your body afterward--such as powder, lotion, or perfume--unless you are told to do so by your health care provider. Only use lotions that are recommended by the manufacturer. 9.  Put on clean clothes or pajamas. 10. If it is the night before your surgery, sleep in clean sheets.  During a sponge bath Follow these steps when using CHG solution during a sponge bath (unless your health care provider gives you different instructions): 1. Use your normal soap and shampoo to wash your face and  hair. 2. Pour the CHG onto a clean washcloth. 3. Starting at your neck, lather your body down to your toes. Make sure you follow these instructions: ? If you will be having surgery, pay special attention to the part of your body where you will be having surgery. Scrub this area for at least 1 minute. ? Do not use CHG on your head or face. If the solution gets into your ears or eyes, rinse them well with water. ? Avoid your genital area. ? Avoid any areas of skin that have broken skin, cuts, or scrapes. ? Scrub your back and under your arms. Make sure to wash skin folds. 4. Let the lather sit on your skin for 1-2 minutes or as long as told by your health care provider. 5. Using a different clean, wet washcloth, thoroughly rinse your entire body. Make sure that all body creases and crevices are rinsed well. 6. Dry off with a clean towel. Do not put any substances on your body afterward--such as powder, lotion, or perfume--unless you are told to do so by your health care provider. Only use lotions that are recommended by the manufacturer. 7. Put on clean clothes or pajamas. 8. If it is the night before your surgery, sleep in clean sheets. How to use CHG prepackaged cloths  Only use CHG cloths as told by your health care provider, and follow the instructions on the label.  Use the CHG cloth on clean, dry skin.  Do not use the CHG cloth on your head or face unless your health care provider tells you to.  When washing with the CHG cloth: ? Avoid your genital area. ? Avoid any areas of skin that have broken skin, cuts, or scrapes. Before surgery Follow these steps when using a CHG cloth to clean before surgery (unless your health care provider gives you different instructions): 1. Using the CHG cloth, vigorously scrub the part of your body where you will be having surgery. Scrub using a back-and-forth motion for 3 minutes. The area on your body should be completely wet with CHG when you are done  scrubbing. 2. Do not rinse. Discard the cloth and let the area air-dry. Do not put any substances on the area afterward, such as powder, lotion, or perfume. 3. Put on clean clothes or pajamas. 4. If it is the night before your surgery, sleep in clean sheets.  For general bathing Follow these steps when using CHG cloths for general bathing (unless your health care provider gives you different instructions). 1. Use a separate CHG cloth for each area of your body. Make sure you wash between any folds of skin and between your fingers and toes. Wash your body in the following order, switching to a new cloth after each step: ? The front of your neck, shoulders, and chest. ? Both of your arms, under your arms, and your hands. ? Your stomach and groin area, avoiding the genitals. ? Your right leg and foot. ? Your left leg and foot. ? The back of your neck, your back, and your buttocks. 2. Do not rinse. Discard the cloth and let the area air-dry. Do not put any  substances on your body afterward--such as powder, lotion, or perfume--unless you are told to do so by your health care provider. Only use lotions that are recommended by the manufacturer. 3. Put on clean clothes or pajamas. Contact a health care provider if:  Your skin gets irritated after scrubbing.  You have questions about using your solution or cloth. Get help right away if:  Your eyes become very red or swollen.  Your eyes itch badly.  Your skin itches badly and is red or swollen.  Your hearing changes.  You have trouble seeing.  You have swelling or tingling in your mouth or throat.  You have trouble breathing.  You swallow any chlorhexidine. Summary  Chlorhexidine gluconate (CHG) is a germ-killing (antiseptic) solution that is used to clean the skin. Cleaning your skin with CHG may help to lower your risk for infection.  You may be given CHG to use for bathing. It may be in a bottle or in a prepackaged cloth to use on  your skin. Carefully follow your health care provider's instructions and the instructions on the product label.  Do not use CHG if you have a chlorhexidine allergy.  Contact your health care provider if your skin gets irritated after scrubbing. This information is not intended to replace advice given to you by your health care provider. Make sure you discuss any questions you have with your health care provider. Document Revised: 08/08/2018 Document Reviewed: 04/19/2017 Elsevier Patient Education  [place].

## 2020-01-09 ENCOUNTER — Other Ambulatory Visit (HOSPITAL_COMMUNITY)
Admission: RE | Admit: 2020-01-09 | Discharge: 2020-01-09 | Disposition: A | Discharge status: Home / Self Care | Payer: No Typology Code available for payment source | Source: Ambulatory Visit | Attending: Orthopedic Surgery | Admitting: Orthopedic Surgery

## 2020-01-09 ENCOUNTER — Encounter (HOSPITAL_COMMUNITY): Payer: Self-pay

## 2020-01-09 ENCOUNTER — Encounter (HOSPITAL_COMMUNITY)
Admission: RE | Admit: 2020-01-09 | Discharge: 2020-01-09 | Disposition: A | Discharge status: Home / Self Care | Payer: No Typology Code available for payment source | Source: Ambulatory Visit | Attending: Orthopedic Surgery | Admitting: Orthopedic Surgery

## 2020-01-09 ENCOUNTER — Other Ambulatory Visit: Payer: Self-pay

## 2020-01-09 DIAGNOSIS — Z20822 Contact with and (suspected) exposure to covid-19: Secondary | ICD-10-CM | POA: Insufficient documentation

## 2020-01-09 DIAGNOSIS — Z01812 Encounter for preprocedural laboratory examination: Secondary | ICD-10-CM | POA: Insufficient documentation

## 2020-01-09 DIAGNOSIS — Z01810 Encounter for preprocedural cardiovascular examination: Secondary | ICD-10-CM | POA: Diagnosis present

## 2020-01-09 DIAGNOSIS — R9431 Abnormal electrocardiogram [ECG] [EKG]: Secondary | ICD-10-CM | POA: Diagnosis not present

## 2020-01-09 LAB — BASIC METABOLIC PANEL
Anion gap: 9 (ref 5–15)
BUN: 13 mg/dL (ref 6–20)
CO2: 24 mmol/L (ref 22–32)
Calcium: 9.3 mg/dL (ref 8.9–10.3)
Chloride: 105 mmol/L (ref 98–111)
Creatinine, Ser: 0.7 mg/dL (ref 0.44–1.00)
GFR calc Af Amer: >60 mL/min (ref >60)
GFR calc non Af Amer: >60 mL/min (ref >60)
Glucose, Bld: 104 mg/dL — ABNORMAL HIGH (ref 70–99)
Potassium: 3.6 mmol/L (ref 3.5–5.1)
Sodium: 138 mmol/L (ref 135–145)

## 2020-01-09 LAB — CBC WITH DIFFERENTIAL/PLATELET
Abs Immature Granulocytes: 0.01 10*3/uL (ref 0.00–0.07)
Basophils Absolute: 0 10*3/uL (ref 0.0–0.1)
Basophils Relative: 0 %
Eosinophils Absolute: 0.1 10*3/uL (ref 0.0–0.5)
Eosinophils Relative: 1 %
HCT: 42.3 % (ref 36.0–46.0)
Hemoglobin: 14 g/dL (ref 12.0–15.0)
Immature Granulocytes: 0 %
Lymphocytes Relative: 29 %
Lymphs Abs: 2 10*3/uL (ref 0.7–4.0)
MCH: 27.9 pg (ref 26.0–34.0)
MCHC: 33.1 g/dL (ref 30.0–36.0)
MCV: 84.3 fL (ref 80.0–100.0)
Monocytes Absolute: 0.6 10*3/uL (ref 0.1–1.0)
Monocytes Relative: 9 %
Neutro Abs: 4.1 10*3/uL (ref 1.7–7.7)
Neutrophils Relative %: 61 %
Platelets: 364 10*3/uL (ref 150–400)
RBC: 5.02 MIL/uL (ref 3.87–5.11)
RDW: 14.5 % (ref 11.5–15.5)
WBC: 6.7 10*3/uL (ref 4.0–10.5)
nRBC: 0 % (ref 0.0–0.2)

## 2020-01-09 LAB — HEMOGLOBIN A1C
Hgb A1c MFr Bld: 6.3 % — ABNORMAL HIGH (ref 4.8–5.6)
Mean Plasma Glucose: 134.11 mg/dL

## 2020-01-10 LAB — SARS CORONAVIRUS 2 (TAT 6-24 HRS): SARS Coronavirus 2: NEGATIVE

## 2020-01-12 ENCOUNTER — Telehealth: Payer: Self-pay

## 2020-01-12 DIAGNOSIS — G8929 Other chronic pain: Secondary | ICD-10-CM

## 2020-01-12 NOTE — H&P (Signed)
Chief Complaint  Patient presents with  . Knee Pain    increased right knee pain x past week / declines injection states Meloxicam helping  .         Last visit       Chief Complaint  Patient presents with  . Knee Pain    right knee painful has been seen in [facility] for it over a year ago   59 year old female presents for evaluation of painful right knee which began in 2019 when she drove to [place]. By the time she got there she had pain swelling and she could not walk. She had to spend several days in the bed at which time she treated herself with ice heat and ibuprofen and was finally able to get up  When she got back she saw Dr. Lyla Glassing at [facility] and he did x-rays recommended some therapy for osteoarthritis  She now complains of intermittent pain appears to be related to the weather and would like that evaluated to see if things have gotten worse      Past Medical History:  Diagnosis Date  . Allergy   . Diabetes mellitus without complication ([place])   . Hypertension   . Thyroid disease     Past Medical History:  Diagnosis Date  . Allergy   . Diabetes mellitus without complication ([place])   . Hypertension   . Thyroid disease    Social History   Tobacco Use  . Smoking status: Never Smoker  . Smokeless tobacco: Never Used  Substance Use Topics  . Alcohol use: No  . Drug use: No   Family History  Problem Relation Age of Onset  . Cancer Mother   . Breast cancer Mother   . Thyroid disease Mother   . Diabetes Other   . Hypertension Other   . Thyroid disease Maternal Aunt   . Thyroid disease Son        Hyperthyroid, not treated      General appearance normal appearing female Oriented x3 Mood pleasant Affect normal Gait and station unremarkable  Cardiovascular exam normal pulses and temperature without edema or swelling Lymph nodes are normal Sensation is completely intact No pathologic reflexes Coordination and  balance are normal  Reexamination of the right knee shows that she has medial joint line tenderness a small amount of fluid in the joint she can extend the knee fully and she has pain at terminal flexion at 115 degrees, the knee otherwise feels stable  CLINICAL DATA:  Chronic right knee pain over the past year.   EXAM: MRI OF THE RIGHT KNEE WITHOUT CONTRAST   TECHNIQUE: Multiplanar, multisequence MR imaging of the knee was performed. No intravenous contrast was administered.   COMPARISON:  Radiographs from 08/25/2019   FINDINGS: Despite efforts by the technologist and patient, motion artifact is present on today's exam and could not be eliminated. This reduces exam sensitivity and specificity.   MENISCI   Medial meniscus: Notable degenerative tearing of the midbody and adjacent posterior horn with diminution in size of the medial meniscus and poor definition of the free edge for example on image 13/7 associated with meniscal extrusion.   Lateral meniscus:  No tear.  Discoid variant.   LIGAMENTS   Cruciates:  Unremarkable   Collaterals:  Considerable MCL bursitis, images 12-16 of series 7.   CARTILAGE   Patellofemoral: Chondral fissuring along the lateral facet, image 12/4. Focal chondral thinning and irregularity in this vicinity, image 14/8. Otherwise mild degenerative  chondral thinning along the patella and central portion of the femoral trochlear groove with mild marginal spurring.   Medial: Moderate to prominent degenerative chondral thinning with chondral irregularity, foci of subcortical marrow edema, and prominent marginal spurring.   Lateral: Mild degenerative chondral thinning with chondral surface irregularity shown on image 13/10. Marginal spurring.   Joint:  Trace knee effusion.  Mildly thickened medial plica.   Popliteal Fossa:  Very small Baker's cyst.   Extensor Mechanism: Mild proximal patellar tendinopathy. Mild prepatellar subcutaneous  edema.   Bones: No significant extra-articular osseous abnormalities identified.   Other: SIRT skip other   IMPRESSION: 1. Notable degenerative tearing of the midbody and adjacent posterior horn medial meniscus with meniscal extrusion. 2. Considerable MCL bursitis. 3. Mild proximal patellar tendinopathy. 4. Trace knee effusion with mildly thickened medial plica. Very small Baker's cyst. 5. Chondral fissuring along the lateral patellar facet. Moderate to prominent degenerative chondral thinning in the medial compartment mild chondral thinning in the lateral compartment.     Electronically Signed   By: Van Clines M.D.   On: 12/01/2019 08:25     Impression torn medial meniscus right knee  The procedure has been fully reviewed with the patient; The risks and benefits of surgery have been discussed and explained and understood. Alternative treatment has also been reviewed, questions were encouraged and answered. The postoperative plan is also been reviewed.   Arthroscopy right knee, partial medial meniscectomy  3:19 PM  01/12/2020  Arther Abbott, MD

## 2020-01-12 NOTE — Telephone Encounter (Signed)
We should have given her an order in the office, she states she did not get one, I sent to [facility]

## 2020-01-12 NOTE — Telephone Encounter (Signed)
Patient called stating she was told by the nurse when she went for her pre-op that they would be sending a Rx for a Julia asking for it to be sent to [facility]. She was just checking status of that and wanted to know if she will be able to get it today or wait to after surgery tomorrow and pick up there.

## 2020-01-13 ENCOUNTER — Ambulatory Visit (HOSPITAL_COMMUNITY): Payer: No Typology Code available for payment source | Admitting: Certified Registered"

## 2020-01-13 ENCOUNTER — Encounter (HOSPITAL_COMMUNITY)
Admission: RE | Disposition: A | Discharge status: Home / Self Care | Payer: Self-pay | Source: Home / Self Care | Attending: Orthopedic Surgery

## 2020-01-13 ENCOUNTER — Encounter: Payer: Self-pay | Admitting: Radiology

## 2020-01-13 ENCOUNTER — Ambulatory Visit (HOSPITAL_COMMUNITY)
Admission: RE | Admit: 2020-01-13 | Discharge: 2020-01-13 | Disposition: A | Discharge status: Home / Self Care | Payer: No Typology Code available for payment source | Attending: Orthopedic Surgery | Admitting: Orthopedic Surgery

## 2020-01-13 ENCOUNTER — Encounter (HOSPITAL_COMMUNITY): Payer: Self-pay | Admitting: Orthopedic Surgery

## 2020-01-13 DIAGNOSIS — S83241A Other tear of medial meniscus, current injury, right knee, initial encounter: Secondary | ICD-10-CM | POA: Insufficient documentation

## 2020-01-13 DIAGNOSIS — I10 Essential (primary) hypertension: Secondary | ICD-10-CM | POA: Insufficient documentation

## 2020-01-13 DIAGNOSIS — M2241 Chondromalacia patellae, right knee: Secondary | ICD-10-CM | POA: Diagnosis not present

## 2020-01-13 DIAGNOSIS — E119 Type 2 diabetes mellitus without complications: Secondary | ICD-10-CM | POA: Diagnosis not present

## 2020-01-13 DIAGNOSIS — M23300 Other meniscus derangements, unspecified lateral meniscus, right knee: Secondary | ICD-10-CM

## 2020-01-13 DIAGNOSIS — X58XXXA Exposure to other specified factors, initial encounter: Secondary | ICD-10-CM | POA: Insufficient documentation

## 2020-01-13 DIAGNOSIS — M23321 Other meniscus derangements, posterior horn of medial meniscus, right knee: Secondary | ICD-10-CM | POA: Diagnosis not present

## 2020-01-13 HISTORY — PX: KNEE ARTHROSCOPY WITH MEDIAL MENISECTOMY: SHX5651

## 2020-01-13 LAB — GLUCOSE, CAPILLARY: Glucose-Capillary: 140 mg/dL — ABNORMAL HIGH (ref 70–99)

## 2020-01-13 SURGERY — ARTHROSCOPY, KNEE, WITH MEDIAL MENISCECTOMY
Anesthesia: General | Site: Knee | Laterality: Right

## 2020-01-13 MED ORDER — LACTATED RINGERS IV SOLN
INTRAVENOUS | Status: DC | PRN
Start: 2020-01-13 — End: 2020-01-13

## 2020-01-13 MED ORDER — CEFAZOLIN SODIUM-DEXTROSE 2-4 GM/100ML-% IV SOLN
INTRAVENOUS | Status: AC
  Filled 2020-01-13: qty 100

## 2020-01-13 MED ORDER — EPHEDRINE 5 MG/ML INJ
INTRAVENOUS | Status: AC
  Filled 2020-01-13: qty 10

## 2020-01-13 MED ORDER — HYDROCODONE-ACETAMINOPHEN 7.5-325 MG PO TABS
ORAL_TABLET | ORAL | Status: AC
  Filled 2020-01-13: qty 1

## 2020-01-13 MED ORDER — ONDANSETRON HCL 4 MG/2ML IJ SOLN
INTRAMUSCULAR | Status: DC | PRN
  Administered 2020-01-13: 4 mg via INTRAVENOUS

## 2020-01-13 MED ORDER — SODIUM CHLORIDE 0.9 % IR SOLN
Status: DC | PRN
  Administered 2020-01-13: 6000 mL

## 2020-01-13 MED ORDER — CHLORHEXIDINE GLUCONATE 0.12 % MT SOLN
OROMUCOSAL | Status: AC
  Filled 2020-01-13: qty 15

## 2020-01-13 MED ORDER — FENTANYL CITRATE (PF) 100 MCG/2ML IJ SOLN
INTRAMUSCULAR | Status: AC
  Filled 2020-01-13: qty 2

## 2020-01-13 MED ORDER — DEXAMETHASONE SODIUM PHOSPHATE 10 MG/ML IJ SOLN
INTRAMUSCULAR | Status: DC | PRN
  Administered 2020-01-13: 4 mg via INTRAVENOUS

## 2020-01-13 MED ORDER — ONDANSETRON HCL 4 MG/2ML IJ SOLN
INTRAMUSCULAR | Status: AC
  Filled 2020-01-13: qty 2

## 2020-01-13 MED ORDER — CEFAZOLIN SODIUM-DEXTROSE 2-4 GM/100ML-% IV SOLN
2.0000 g | INTRAVENOUS | Status: AC
  Administered 2020-01-13: 2 g via INTRAVENOUS

## 2020-01-13 MED ORDER — PROPOFOL 10 MG/ML IV BOLUS
INTRAVENOUS | Status: DC | PRN
  Administered 2020-01-13: 200 mg via INTRAVENOUS
  Administered 2020-01-13: 100 mg via INTRAVENOUS

## 2020-01-13 MED ORDER — LACTATED RINGERS IV SOLN: INTRAVENOUS | Status: DC

## 2020-01-13 MED ORDER — PHENYLEPHRINE 40 MCG/ML (10ML) SYRINGE FOR IV PUSH (FOR BLOOD PRESSURE SUPPORT)
PREFILLED_SYRINGE | INTRAVENOUS | Status: AC
  Filled 2020-01-13: qty 10

## 2020-01-13 MED ORDER — FENTANYL CITRATE (PF) 100 MCG/2ML IJ SOLN
INTRAMUSCULAR | Status: DC | PRN
  Administered 2020-01-13: 50 ug via INTRAVENOUS
  Administered 2020-01-13: 25 ug via INTRAVENOUS
  Administered 2020-01-13: 50 ug via INTRAVENOUS
  Administered 2020-01-13: 25 ug via INTRAVENOUS
  Administered 2020-01-13: 50 ug via INTRAVENOUS

## 2020-01-13 MED ORDER — BUPIVACAINE-EPINEPHRINE (PF) 0.5% -1:200000 IJ SOLN
INTRAMUSCULAR | Status: DC | PRN
  Administered 2020-01-13: 60 mL via PERINEURAL

## 2020-01-13 MED ORDER — EPINEPHRINE PF 1 MG/ML IJ SOLN
INTRAMUSCULAR | Status: AC
  Filled 2020-01-13: qty 5

## 2020-01-13 MED ORDER — KETOROLAC TROMETHAMINE 30 MG/ML IJ SOLN
30.0000 mg | Freq: Once | INTRAMUSCULAR | Status: AC
  Administered 2020-01-13: 30 mg via INTRAVENOUS

## 2020-01-13 MED ORDER — HYDROCODONE-ACETAMINOPHEN 5-325 MG PO TABS: 1.0000 | ORAL_TABLET | ORAL | 0 refills | Status: AC | PRN

## 2020-01-13 MED ORDER — ONDANSETRON HCL 4 MG/2ML IJ SOLN
4.0000 mg | Freq: Once | INTRAMUSCULAR | Status: AC
  Administered 2020-01-13: 4 mg via INTRAVENOUS

## 2020-01-13 MED ORDER — LIDOCAINE 2% (20 MG/ML) 5 ML SYRINGE
INTRAMUSCULAR | Status: DC | PRN
  Administered 2020-01-13: 80 mg via INTRAVENOUS

## 2020-01-13 MED ORDER — DEXAMETHASONE SODIUM PHOSPHATE 10 MG/ML IJ SOLN
INTRAMUSCULAR | Status: AC
  Filled 2020-01-13: qty 1

## 2020-01-13 MED ORDER — MIDAZOLAM HCL 5 MG/5ML IJ SOLN
INTRAMUSCULAR | Status: DC | PRN
  Administered 2020-01-13: 2 mg via INTRAVENOUS

## 2020-01-13 MED ORDER — EPHEDRINE SULFATE-NACL 50-0.9 MG/10ML-% IV SOSY
PREFILLED_SYRINGE | INTRAVENOUS | Status: DC | PRN
  Administered 2020-01-13 (×3): 10 mg via INTRAVENOUS
  Administered 2020-01-13: 15 mg via INTRAVENOUS

## 2020-01-13 MED ORDER — MIDAZOLAM HCL 2 MG/2ML IJ SOLN
INTRAMUSCULAR | Status: AC
  Filled 2020-01-13: qty 2

## 2020-01-13 MED ORDER — ORAL CARE MOUTH RINSE: 15.0000 mL | Freq: Once | OROMUCOSAL | Status: DC

## 2020-01-13 MED ORDER — HYDROCODONE-ACETAMINOPHEN 7.5-325 MG PO TABS
1.0000 | ORAL_TABLET | Freq: Once | ORAL | Status: AC
  Administered 2020-01-13: 1 via ORAL

## 2020-01-13 MED ORDER — FENTANYL CITRATE (PF) 100 MCG/2ML IJ SOLN: 25.0000 ug | INTRAMUSCULAR | Status: DC | PRN

## 2020-01-13 MED ORDER — LIDOCAINE 2% (20 MG/ML) 5 ML SYRINGE
INTRAMUSCULAR | Status: AC
  Filled 2020-01-13: qty 5

## 2020-01-13 MED ORDER — PHENYLEPHRINE 40 MCG/ML (10ML) SYRINGE FOR IV PUSH (FOR BLOOD PRESSURE SUPPORT)
PREFILLED_SYRINGE | INTRAVENOUS | Status: DC | PRN
  Administered 2020-01-13 (×5): 80 ug via INTRAVENOUS

## 2020-01-13 MED ORDER — SODIUM CHLORIDE 0.9 % IR SOLN: Status: DC | PRN

## 2020-01-13 MED ORDER — BUPIVACAINE-EPINEPHRINE (PF) 0.5% -1:200000 IJ SOLN
INTRAMUSCULAR | Status: AC
  Filled 2020-01-13: qty 60

## 2020-01-13 MED ORDER — PROPOFOL 10 MG/ML IV BOLUS
INTRAVENOUS | Status: AC
  Filled 2020-01-13: qty 20

## 2020-01-13 MED ORDER — PROMETHAZINE HCL 12.5 MG PO TABS
12.5000 mg | ORAL_TABLET | Freq: Four times a day (QID) | ORAL | 0 refills | Status: DC | PRN
Start: 2020-01-13 — End: 2022-02-08

## 2020-01-13 MED ORDER — KETOROLAC TROMETHAMINE 30 MG/ML IJ SOLN
INTRAMUSCULAR | Status: AC
  Filled 2020-01-13: qty 1

## 2020-01-13 MED ORDER — CHLORHEXIDINE GLUCONATE 0.12 % MT SOLN: 15.0000 mL | Freq: Once | OROMUCOSAL | Status: DC

## 2020-01-13 MED ORDER — ONDANSETRON HCL 4 MG/2ML IJ SOLN: 4.0000 mg | Freq: Once | INTRAMUSCULAR | Status: DC | PRN

## 2020-01-13 SURGICAL SUPPLY — 52 items
ABLATOR ASPIRATE 50D MULTI-PRT (SURGICAL WAND) ×1 IMPLANT
APL PRP STRL LF DISP 70% ISPRP (MISCELLANEOUS) ×1
BAG HAMPER (MISCELLANEOUS) ×2 IMPLANT
BLADE SURG SZ11 CARB STEEL (BLADE) ×2 IMPLANT
BNDG CMPR STD VLCR NS LF 5.8X6 (GAUZE/BANDAGES/DRESSINGS) ×1
BNDG ELASTIC 6X5.8 VLCR NS LF (GAUZE/BANDAGES/DRESSINGS) ×2 IMPLANT
CHLORAPREP W/TINT 26 (MISCELLANEOUS) ×2 IMPLANT
CLOTH BEACON ORANGE TIMEOUT ST (SAFETY) ×2 IMPLANT
COOLER ICEMAN CLASSIC (MISCELLANEOUS) ×2 IMPLANT
COVER WAND RF STERILE (DRAPES) ×4 IMPLANT
CUFF TOURN SGL QUICK 34 (TOURNIQUET CUFF)
CUFF TOURN SGL QUICK 42 (TOURNIQUET CUFF) ×1 IMPLANT
CUFF TRNQT CYL 34X4.125X (TOURNIQUET CUFF) IMPLANT
DECANTER SPIKE VIAL GLASS SM (MISCELLANEOUS) ×4 IMPLANT
DRAPE HALF SHEET 40X57 (DRAPES) ×2 IMPLANT
GAUZE 4X4 16PLY RFD (DISPOSABLE) ×2 IMPLANT
GAUZE SPONGE 4X4 12PLY STRL (GAUZE/BANDAGES/DRESSINGS) ×2 IMPLANT
GAUZE SPONGE 4X4 16PLY XRAY LF (GAUZE/BANDAGES/DRESSINGS) ×2 IMPLANT
GAUZE XEROFORM 5X9 LF (GAUZE/BANDAGES/DRESSINGS) ×2 IMPLANT
GLOVE BIOGEL PI IND STRL 7.0 (GLOVE) ×2 IMPLANT
GLOVE BIOGEL PI INDICATOR 7.0 (GLOVE) ×2
GLOVE SKINSENSE NS SZ8.0 LF (GLOVE) ×1
GLOVE SKINSENSE STRL SZ8.0 LF (GLOVE) ×1 IMPLANT
GLOVE SS N UNI LF 8.5 STRL (GLOVE) ×2 IMPLANT
GOWN STRL REUS W/TWL LRG LVL3 (GOWN DISPOSABLE) ×2 IMPLANT
GOWN STRL REUS W/TWL XL LVL3 (GOWN DISPOSABLE) ×2 IMPLANT
IV NS IRRIG 3000ML ARTHROMATIC (IV SOLUTION) ×4 IMPLANT
KIT BLADEGUARD II DBL (SET/KITS/TRAYS/PACK) ×2 IMPLANT
KIT TURNOVER CYSTO (KITS) ×2 IMPLANT
MANIFOLD NEPTUNE II (INSTRUMENTS) ×2 IMPLANT
MARKER SKIN DUAL TIP RULER LAB (MISCELLANEOUS) ×2 IMPLANT
NDL HYPO 18GX1.5 BLUNT FILL (NEEDLE) ×1 IMPLANT
NDL HYPO 21X1.5 SAFETY (NEEDLE) ×1 IMPLANT
NDL SPNL 18GX3.5 QUINCKE PK (NEEDLE) ×1 IMPLANT
NEEDLE HYPO 18GX1.5 BLUNT FILL (NEEDLE) ×2 IMPLANT
NEEDLE HYPO 21X1.5 SAFETY (NEEDLE) ×2 IMPLANT
NEEDLE SPNL 18GX3.5 QUINCKE PK (NEEDLE) ×2 IMPLANT
NS IRRIG 1000ML POUR BTL (IV SOLUTION) ×2 IMPLANT
PACK ARTHRO LIMB DRAPE STRL (MISCELLANEOUS) ×2 IMPLANT
PAD ABD 5X9 TENDERSORB (GAUZE/BANDAGES/DRESSINGS) ×2 IMPLANT
PAD ARMBOARD 7.5X6 YLW CONV (MISCELLANEOUS) ×2 IMPLANT
PAD COLD SHLDR WRAP-ON (PAD) ×1 IMPLANT
PADDING CAST COTTON 6X4 STRL (CAST SUPPLIES) ×2 IMPLANT
PORT APPOLLO RF 90DEGREE MULTI (SURGICAL WAND) IMPLANT
RESECTOR TORPEDO 4MM 13CM CVD (MISCELLANEOUS) ×1 IMPLANT
SET ARTHROSCOPY INST (INSTRUMENTS) ×2 IMPLANT
SET BASIN LINEN APH (SET/KITS/TRAYS/PACK) ×2 IMPLANT
SUT ETHILON 3 0 FSL (SUTURE) ×2 IMPLANT
SYR 10ML LL (SYRINGE) ×2 IMPLANT
SYR 30ML LL (SYRINGE) ×2 IMPLANT
TUBE CONNECTING 12X1/4 (SUCTIONS) ×4 IMPLANT
TUBING IN/OUT FLOW W/MAIN PUMP (TUBING) ×2 IMPLANT

## 2020-01-13 NOTE — Transfer of Care (Signed)
Immediate Anesthesia Transfer of Care Note  Patient: Jodi Wade  Procedure(s) Performed: KNEE ARTHROSCOPY WITH MEDIAL AND LATERAL  MENISCECTOMY (Right Knee)  Patient Location: PACU  Anesthesia Type:General  Level of Consciousness: awake, alert  and oriented  Airway & Oxygen Therapy: Patient Spontanous Breathing and Patient connected to nasal cannula oxygen  Post-op Assessment: Report given to RN and Post -op Vital signs reviewed and stable  Post vital signs: Reviewed and stable  Last Vitals:  Vitals Value Taken Time  BP 110/72 01/13/20 1145  Temp    Pulse 89   Resp 15 01/13/20 1145  SpO2 100%   Vitals shown include unvalidated device data.  Last Pain: There were no vitals filed for this visit.       Complications: No complications documented.

## 2020-01-13 NOTE — Interval H&P Note (Signed)
History and Physical Interval Note:  01/13/2020 10:15 AM  Jodi Wade  has presented today for surgery, with the diagnosis of Torn medial meniscus right knee.  The various methods of treatment have been discussed with the patient and family. After consideration of risks, benefits and other options for treatment, the patient has consented to  Procedure(s): KNEE ARTHROSCOPY WITH MEDIAL MENISCECTOMY (Right) as a surgical intervention.  The patient's history has been reviewed, patient examined, no change in status, stable for surgery.  I have reviewed the patient's chart and labs.  Questions were answered to the patient's satisfaction.     Arther Abbott

## 2020-01-13 NOTE — Brief Op Note (Signed)
01/13/2020   11:47 AM   PATIENT:  Jodi Wade  59 y.o. female   PRE-OPERATIVE DIAGNOSIS:  Torn medial meniscus right knee   POST-OPERATIVE DIAGNOSIS:  Torn medial meniscus right knee   PROCEDURE:  Procedure(s): KNEE ARTHROSCOPY WITH MEDIAL AND LATERAL  MENISCECTOMY (Right)    Findings torn medial meniscus posterior horn and part of the body   Diffuse chondral changes grade 2 medial femoral condyle mild grade 1 tibial plateau medially   Laterally we found a posterior horn and body small lateral meniscal tear   Patellofemoral chondromalacia grade 1 and 2   SURGEON:  Surgeon(s) and Role:    * Vyolet Sakuma E, MD - Primary   [id number]   There were no assistants    Anesthesia was LMA general    Injected 60 cc of Marcaine with epinephrine    No specimen    Counts were correct    No tourniquet        DICTATION: .Dragon Dictation   PLAN OF CARE: Discharge to home after PACU   PATIENT DISPOSITION:  PACU - hemodynamically stable.   Delay start of Pharmacological VTE agent (>24hrs) due to surgical blood loss or risk of bleeding: not applicable   The surgery was done in the following manner   The patient was identified in the preop area as Jodi Wade this was confirmed by 2 appropriate and approved identification markers.  Chart review and image review was performed and the patient's surgery was approved to continue   She was taken to the operating for general anesthesia LMA   The surgery was done with the patient in the supine position on a flat OR table   Lateral post was applied and checked prior to prep and drape   After prep and drape timeout was executed and surgical site confirmed   Lateral portal was established after injecting small amount of Marcaine with epinephrine lateral portal was created and the scope was placed into the joint diagnostic arthroscopy was performed.  The findings are listed above   A spinal needle was used to establish a  medial portal   The medial side was tight even with valgus stress on the joint and the spinal needle was used to piecrust the medial collateral ligament deep portion and then valgus stress was applied and the joint opened up nicely.  A curved torpedo was used to remove loose meniscal tissue and then a straight biter was used to remove the remaining portion of the torn meniscus which was then contoured with a 50 degree wand.  The torpedo was used to remove the meniscal fragments and a chondroplasty was performed on the medial femoral condyle.  Once the meniscus was probed and found to be stable we turned our attention to the lateral joint   The torpedo was used to remove the torn piece of posterior horn of lateral meniscus followed by resection of the body   The joint was irrigated suctioned dry and this was followed by injection of the remaining 60 cc of Marcaine with epinephrine   3 sutures were placed one lateral to medial using 3-0 nylon   Sterile dressings were applied and the Cryo/Cuff was applied and activated followed by extubation patient was taken recovery room in stable condition    

## 2020-01-13 NOTE — Anesthesia Postprocedure Evaluation (Signed)
Anesthesia Post Note  Patient: Jodi Wade  Procedure(s) Performed: KNEE ARTHROSCOPY WITH MEDIAL AND LATERAL  MENISCECTOMY (Right Knee)  Patient location during evaluation: PACU Anesthesia Type: General Level of consciousness: awake Pain management: pain level controlled Vital Signs Assessment: post-procedure vital signs reviewed and stable Respiratory status: spontaneous breathing Cardiovascular status: blood pressure returned to baseline Anesthetic complications: no   No complications documented.   Last Vitals:  Vitals:   01/13/20 1300 01/13/20 1322  BP: 127/79 116/64  Pulse:  88  Resp: 14 16  Temp:  (!) 36.3 C  SpO2:  96%    Last Pain:  Vitals:   01/13/20 1322  TempSrc: Oral  PainSc: [place]

## 2020-01-13 NOTE — Anesthesia Preprocedure Evaluation (Signed)
Anesthesia Evaluation  Patient identified by MRN, date of birth, ID band Patient awake    Reviewed: Allergy & Precautions, H&P , NPO status , Patient's Chart, lab work & pertinent test results, reviewed documented beta blocker date and time   Airway Mallampati: II  TM Distance: >3 FB Neck ROM: full    Dental no notable dental hx.    Pulmonary neg pulmonary ROS,    Pulmonary exam normal breath sounds clear to auscultation       Cardiovascular Exercise Tolerance: Good hypertension, negative cardio ROS   Rhythm:regular Rate:Normal     Neuro/Psych negative neurological ROS  negative psych ROS   GI/Hepatic negative GI ROS, Neg liver ROS,   Endo/Other  diabetesHypothyroidism   Renal/GU negative Renal ROS  negative genitourinary   Musculoskeletal   Abdominal   Peds  Hematology negative hematology ROS (+)   Anesthesia Other Findings   Reproductive/Obstetrics negative OB ROS                             Anesthesia Physical Anesthesia Plan  ASA: II  Anesthesia Plan: General   Post-op Pain Management:    Induction:   PONV Risk Score and Plan: 3 and Ondansetron  Airway Management Planned:   Additional Equipment:   Intra-op Plan:   Post-operative Plan:   Informed Consent: I have reviewed the patients History and Physical, chart, labs and discussed the procedure including the risks, benefits and alternatives for the proposed anesthesia with the patient or authorized representative who has indicated his/her understanding and acceptance.     Dental Advisory Given  Plan Discussed with: CRNA  Anesthesia Plan Comments:         Anesthesia Quick Evaluation

## 2020-01-13 NOTE — Discharge Instructions (Signed)
General Anesthesia, Adult, Care After This sheet gives you information about how to care for yourself after your procedure. Your health care provider may also give you more specific instructions. If you have problems or questions, contact your health care provider. What can I expect after the procedure? After the procedure, the following side effects are common:  Pain or discomfort at the IV site.  Nausea.  Vomiting.  Sore throat.  Trouble concentrating.  Feeling cold or chills.  Weak or tired.  Sleepiness and fatigue.  Soreness and body aches. These side effects can affect parts of the body that were not involved in surgery. Follow these instructions at home:  For at least 24 hours after the procedure:  Have a responsible adult stay with you. It is important to have someone help care for you until you are awake and alert.  Rest as needed.  Do not: ? Participate in activities in which you could fall or become injured. ? Drive. ? Use heavy machinery. ? Drink alcohol. ? Take sleeping pills or medicines that cause drowsiness. ? Make important decisions or sign legal documents. ? Take care of children on your own. Eating and drinking  Follow any instructions from your health care provider about eating or drinking restrictions.  When you feel hungry, start by eating small amounts of foods that are soft and easy to digest (bland), such as toast. Gradually return to your regular diet.  Drink enough fluid to keep your urine pale yellow.  If you vomit, rehydrate by drinking water, juice, or clear broth. General instructions  If you have sleep apnea, surgery and certain medicines can increase your risk for breathing problems. Follow instructions from your health care provider about wearing your sleep device: ? Anytime you are sleeping, including during daytime naps. ? While taking prescription pain medicines, sleeping medicines, or medicines that make you drowsy.  Return to  your normal activities as told by your health care provider. Ask your health care provider what activities are safe for you.  Take over-the-counter and prescription medicines only as told by your health care provider.  If you smoke, do not smoke without supervision.  Keep all follow-up visits as told by your health care provider. This is important. Contact a health care provider if:  You have nausea or vomiting that does not get better with medicine.  You cannot eat or drink without vomiting.  You have pain that does not get better with medicine.  You are unable to pass urine.  You develop a skin rash.  You have a fever.  You have redness around your IV site that gets worse. Get help right away if:  You have difficulty breathing.  You have chest pain.  You have blood in your urine or stool, or you vomit blood. Summary  After the procedure, it is common to have a sore throat or nausea. It is also common to feel tired.  Have a responsible adult stay with you for the first 24 hours after general anesthesia. It is important to have someone help care for you until you are awake and alert.  When you feel hungry, start by eating small amounts of foods that are soft and easy to digest (bland), such as toast. Gradually return to your regular diet.  Drink enough fluid to keep your urine pale yellow.  Return to your normal activities as told by your health care provider. Ask your health care provider what activities are safe for you. This information is not   intended to replace advice given to you by your health care provider. Make sure you discuss any questions you have with your health care provider. Document Revised: 05/25/2017 Document Reviewed: 01/05/2017 Elsevier Patient Education  2020 [employer]  

## 2020-01-13 NOTE — Anesthesia Procedure Notes (Signed)
Procedure Name: LMA Insertion Date/Time: 01/13/2020 10:31 AM Performed by: Genelle Bal, CRNA Pre-anesthesia Checklist: Patient identified, Emergency Drugs available, Suction available and Patient being monitored Patient Re-evaluated:Patient Re-evaluated prior to induction Oxygen Delivery Method: Circle system utilized Preoxygenation: Pre-oxygenation with 100% oxygen Induction Type: IV induction Ventilation: Mask ventilation without difficulty LMA: LMA inserted LMA Size: 4.0 Number of attempts: 2 Airway Equipment and Method: Bite block Placement Confirmation: positive ETCO2 Tube secured with: Tape Dental Injury: Teeth and Oropharynx as per pre-operative assessment

## 2020-01-14 ENCOUNTER — Encounter (HOSPITAL_COMMUNITY): Payer: Self-pay | Admitting: Orthopedic Surgery

## 2020-01-14 NOTE — Op Note (Signed)
01/13/2020   11:47 AM   PATIENT:  Jodi Wade  59 y.o. female   PRE-OPERATIVE DIAGNOSIS:  Torn medial meniscus right knee   POST-OPERATIVE DIAGNOSIS:  Torn medial meniscus right knee   PROCEDURE:  Procedure(s): KNEE ARTHROSCOPY WITH MEDIAL AND LATERAL  MENISCECTOMY (Right)    Findings torn medial meniscus posterior horn and part of the body   Diffuse chondral changes grade 2 medial femoral condyle mild grade 1 tibial plateau medially   Laterally we found a posterior horn and body small lateral meniscal tear   Patellofemoral chondromalacia grade 1 and 2   SURGEON:  Surgeon(s) and Role:    Carole Civil, MD - Primary   [phone]   There were no assistants    Anesthesia was LMA general    Injected 60 cc of Marcaine with epinephrine    No specimen    Counts were correct    No tourniquet        DICTATION: .Dragon Dictation   PLAN OF CARE: Discharge to home after PACU   PATIENT DISPOSITION:  PACU - hemodynamically stable.   Delay start of Pharmacological VTE agent (>24hrs) due to surgical blood loss or risk of bleeding: not applicable   The surgery was done in the following manner   The patient was identified in the preop area as Jodi Wade this was confirmed by 2 appropriate and approved identification markers.  Chart review and image review was performed and the patient's surgery was approved to continue   She was taken to the operating for general anesthesia LMA   The surgery was done with the patient in the supine position on a flat OR table   Lateral post was applied and checked prior to prep and drape   After prep and drape timeout was executed and surgical site confirmed   Lateral portal was established after injecting small amount of Marcaine with epinephrine lateral portal was created and the scope was placed into the joint diagnostic arthroscopy was performed.  The findings are listed above   A spinal needle was used to establish a  medial portal   The medial side was tight even with valgus stress on the joint and the spinal needle was used to piecrust the medial collateral ligament deep portion and then valgus stress was applied and the joint opened up nicely.  A curved torpedo was used to remove loose meniscal tissue and then a straight biter was used to remove the remaining portion of the torn meniscus which was then contoured with a 50 degree wand.  The torpedo was used to remove the meniscal fragments and a chondroplasty was performed on the medial femoral condyle.  Once the meniscus was probed and found to be stable we turned our attention to the lateral joint   The torpedo was used to remove the torn piece of posterior horn of lateral meniscus followed by resection of the body   The joint was irrigated suctioned dry and this was followed by injection of the remaining 60 cc of Marcaine with epinephrine   3 sutures were placed one lateral to medial using 3-0 nylon   Sterile dressings were applied and the Cryo/Cuff was applied and activated followed by extubation patient was taken recovery room in stable condition

## 2020-01-20 ENCOUNTER — Other Ambulatory Visit: Payer: Self-pay

## 2020-01-20 ENCOUNTER — Encounter: Payer: Self-pay | Admitting: Orthopedic Surgery

## 2020-01-20 ENCOUNTER — Ambulatory Visit (INDEPENDENT_AMBULATORY_CARE_PROVIDER_SITE_OTHER): Payer: No Typology Code available for payment source | Admitting: Orthopedic Surgery

## 2020-01-20 DIAGNOSIS — G8929 Other chronic pain: Secondary | ICD-10-CM

## 2020-01-20 DIAGNOSIS — M25561 Pain in right knee: Secondary | ICD-10-CM

## 2020-01-20 MED ORDER — HYDROCODONE-ACETAMINOPHEN 5-325 MG PO TABS: 1.0000 | ORAL_TABLET | Freq: Four times a day (QID) | ORAL | 0 refills | Status: DC | PRN

## 2020-01-20 NOTE — Progress Notes (Signed)
Post op   Chief Complaint  Patient presents with   Routine Post Op    right knee 01/13/20   SARK med and lateral menisectomy   01/13/2020   11:47 AM   PATIENT:  Jodi Wade  59 y.o. female   PRE-OPERATIVE DIAGNOSIS:  Torn medial meniscus right knee   POST-OPERATIVE DIAGNOSIS:  Torn medial meniscus right knee   PROCEDURE:  Procedure(s): KNEE ARTHROSCOPY WITH MEDIAL AND LATERAL  MENISCECTOMY (Right)    Findings torn medial meniscus posterior horn and part of the body   Diffuse chondral changes grade 2 medial femoral condyle mild grade 1 tibial plateau medially   Laterally we found a posterior horn and body small lateral meniscal tear   Patellofemoral chondromalacia grade 1 and 2   SURGEON:  Surgeon(s) and Role:    Carole Civil, MD - Primary   [phone]   Meds ordered this encounter  Medications   HYDROcodone-acetaminophen (NORCO/VICODIN) 5-325 MG tablet    Sig: Take 1 tablet by mouth every 6 (six) hours as needed for moderate pain.    Dispense:  30 tablet    Refill:  0

## 2020-01-20 NOTE — Patient Instructions (Signed)
Home exercises   Try to stop using the hydrocodone

## 2020-02-16 DIAGNOSIS — Z9889 Other specified postprocedural states: Secondary | ICD-10-CM | POA: Insufficient documentation

## 2020-02-18 ENCOUNTER — Ambulatory Visit (INDEPENDENT_AMBULATORY_CARE_PROVIDER_SITE_OTHER): Payer: No Typology Code available for payment source | Admitting: Orthopedic Surgery

## 2020-02-18 ENCOUNTER — Other Ambulatory Visit: Payer: Self-pay

## 2020-02-18 ENCOUNTER — Encounter: Payer: Self-pay | Admitting: Orthopedic Surgery

## 2020-02-18 VITALS — BP 162/108 | HR 71

## 2020-02-18 DIAGNOSIS — M25561 Pain in right knee: Secondary | ICD-10-CM

## 2020-02-18 DIAGNOSIS — Z9889 Other specified postprocedural states: Secondary | ICD-10-CM

## 2020-02-18 DIAGNOSIS — G8929 Other chronic pain: Secondary | ICD-10-CM

## 2020-02-18 MED ORDER — IBUPROFEN 800 MG PO TABS: 800.0000 mg | ORAL_TABLET | Freq: Three times a day (TID) | ORAL | 5 refills | Status: DC | PRN

## 2020-02-18 NOTE — Patient Instructions (Signed)
RTW WITH RESTRICTIONS: - EXCEPT REHAB TECH PORTION, EXAM ROOMS AND LAUNDRY DUTY

## 2020-02-18 NOTE — Progress Notes (Signed)
Patient would like a work note for light duty, no lifting and pulling heavy items.

## 2020-02-18 NOTE — Progress Notes (Signed)
Chief Complaint  Patient presents with  . Knee Pain    chronic right knee pain, better no pain today. exercises are helping    Postop status post arthroscopy of the right knee the patient says she was able to go to the folk [place] walk around had no pain or swelling  She reports that her knee is feeling good she would like a refill on ibuprofen she has good range of motion good strength good stability in the joint no swelling little bit of flexion contracture in the knee which is chronic  Follow-up as needed may return to work with restrictions  Encounter Diagnoses  Name Primary?  . S/P arthroscopy of right knee 01/13/20  Yes  . Chronic pain of right knee     Meds ordered this encounter  Medications  . ibuprofen (ADVIL) 800 MG tablet    Sig: Take 1 tablet (800 mg total) by mouth every 8 (eight) hours as needed for moderate pain.    Dispense:  90 tablet    Refill:  5

## 2020-02-20 ENCOUNTER — Telehealth: Payer: Self-pay | Admitting: Orthopedic Surgery

## 2020-02-20 DIAGNOSIS — Z9889 Other specified postprocedural states: Secondary | ICD-10-CM

## 2020-02-20 DIAGNOSIS — M25561 Pain in right knee: Secondary | ICD-10-CM

## 2020-02-20 DIAGNOSIS — G8929 Other chronic pain: Secondary | ICD-10-CM

## 2020-02-20 MED FILL — IBUPROFEN 800 MG TABS: 800 | 30 days supply | Qty: 90 | Fill #1

## 2020-02-20 NOTE — Telephone Encounter (Signed)
Patient came by office to ask if Dr Aline Brochure would consider ordering physical therapy for 1 time a week for 2 weeks, for strengthening.

## 2020-02-20 NOTE — Telephone Encounter (Signed)
yes

## 2020-02-23 ENCOUNTER — Ambulatory Visit (HOSPITAL_COMMUNITY): Payer: No Typology Code available for payment source | Attending: Orthopedic Surgery | Admitting: Physical Therapy

## 2020-02-23 ENCOUNTER — Other Ambulatory Visit: Payer: Self-pay

## 2020-02-23 DIAGNOSIS — G8929 Other chronic pain: Secondary | ICD-10-CM | POA: Diagnosis present

## 2020-02-23 DIAGNOSIS — M25561 Pain in right knee: Secondary | ICD-10-CM | POA: Diagnosis not present

## 2020-02-23 DIAGNOSIS — R2689 Other abnormalities of gait and mobility: Secondary | ICD-10-CM | POA: Diagnosis present

## 2020-02-23 DIAGNOSIS — M25661 Stiffness of right knee, not elsewhere classified: Secondary | ICD-10-CM | POA: Diagnosis present

## 2020-02-23 DIAGNOSIS — R262 Difficulty in walking, not elsewhere classified: Secondary | ICD-10-CM | POA: Diagnosis present

## 2020-02-23 NOTE — Therapy (Signed)
[facility] [address] [place], [place], [address] Phone: [phone]   Fax:  [phone]  Physical Therapy Evaluation  Patient Details  Name: Jodi Wade MRN: [id number] Date of Birth: 04-08-1961 Referring Provider (PT): Arther Abbott, MD   Encounter Date: 02/23/2020   PT End of Session - 02/23/20 1018    Visit Number 1    Number of Visits 12    Date for PT Re-Evaluation 04/05/20    Authorization Type [place] FOCUS, no VL, no auth    Progress Note Due on Visit 10    PT Start Time 1130    PT Stop Time 1212    PT Time Calculation (min) 42 min    Activity Tolerance Patient tolerated treatment well    Behavior During Therapy [facility] for tasks assessed/performed           Past Medical History:  Diagnosis Date  . Allergy   . Diabetes mellitus without complication ([place])   . Hypertension   . Thyroid disease     Past Surgical History:  Procedure Laterality Date  . BREAST BIOPSY Right   . KNEE ARTHROSCOPY WITH MEDIAL MENISECTOMY Right 01/13/2020   Procedure: KNEE ARTHROSCOPY WITH MEDIAL AND LATERAL  MENISCECTOMY;  Surgeon: Carole Civil, MD;  Location: [facility];  Service: Orthopedics;  Laterality: Right;  . PARTIAL HYSTERECTOMY     abdominal    There were no vitals filed for this visit.    Subjective Assessment - 02/23/20 1148    Subjective States her knee started hurting December 2019. She was on a road trip to [place] and when she got out of the car her knee was really swollen and painful and it never really got better. States she saw the MD a couple of times and ultimately decided to surgery. Reports prior to her surgery she was doing Zumba and that seemed to aggravate it. States she still had intermittent swelling and had to give up her Zumba. States she had surgery on 01/13/20 she had a right knee scope where the MD found 2 tears in her meniscus. States she still has pulling in her knee and reports it is 3/10.  States she is currently performing LAQs, SLR, SAQs. States she has been trying to bend her knee throughout the day. States she has been icing her knee with the ice machine and has been slightly elevating it. States that today she feels it is more tight but just returned to work on light duty. States she is currently driving.    Currently in Pain? Yes    Pain Score 3     Pain Location Knee    Pain Orientation Right    Pain Descriptors / Indicators Aching;Tightness    Pain Type Surgical pain    Pain Onset More than a month ago    Pain Frequency Intermittent    Aggravating Factors  bending    Pain Relieving Factors rest, ices,              [facility] PT Assessment - 02/23/20 0001      Assessment   Medical Diagnosis R knee scope    Referring Provider (PT) Arther Abbott, MD    Onset Date/Surgical Date 01/13/20      Precautions   Precautions None      Restrictions   Other Position/Activity Restrictions WBAT       Balance Screen   Has the patient fallen in the past 6 months No  Prior Function   Level of Independence Independent      Cognition   Overall Cognitive Status Within Functional Limits for tasks assessed      Observation/Other Assessments   Focus on Therapeutic Outcomes (FOTO)  48.15% function       Observation/Other Assessments-Edema    Edema Circumferential      Circumferential Edema   Circumferential - Right 46.5cm   at knee joint line   Circumferential - Left  40.2 cm    at knee joint line     ROM / Strength   AROM / PROM / Strength AROM;Strength      AROM   AROM Assessment Site Knee    Right/Left Knee Left;Right    Right Knee Extension 12   lacking   Right Knee Flexion 102    Left Knee Extension 0    Left Knee Flexion 125      Strength   Strength Assessment Site Knee;Hip      Palpation   Patella mobility R patella limited in all directions    Palpation comment tenderness to palpation surrounding right knee      Ambulation/Gait    Ambulation/Gait Yes    Ambulation/Gait Assistance 6: Modified independent (Device/Increase time)    Ambulation Distance (Feet) 246 Feet    Gait Pattern Decreased stance time - right;Step-through pattern;Decreased stride length;Right circumduction;Decreased hip/knee flexion - right    Gait velocity decreased    Gait Comments 2MW                      Objective measurements completed on examination: See above findings.       [facility] Adult PT Treatment/Exercise - 02/23/20 0001      Exercises   Exercises Knee/Hip      Knee/Hip Exercises: Aerobic   Stationary Bike rocking back and forth 4 minutes -      Knee/Hip Exercises: Supine   Heel Slides AROM;Right;20 reps                  PT Education - 02/23/20 1236    Education Details on elevation, use of compression garment at work, on using bike -rocking back and forth at work. on rehab process    Person(s) Educated Patient    Methods Explanation    Comprehension Verbalized understanding            PT Short Term Goals - 02/23/20 1237      PT SHORT TERM GOAL #1   Title Patient will report at least 50% improvement in overall symptoms and/or function to demonstrate improved functional mobility    Time 3    Period Weeks    Status New    Target Date 03/15/20      PT SHORT TERM GOAL #2   Title Patient will be able to demonstrate 0-120 degrees if right knee ROM    Time 3    Period Weeks    Status New    Target Date 03/15/20      PT SHORT TERM GOAL #3   Title Patient will be independent in self management strategies to improve quality of life and functional outcomes.    Time 3    Period Weeks    Status New    Target Date 03/15/20             PT Long Term Goals - 02/23/20 1239      PT LONG TERM GOAL #1   Title Patient will report at  least 75% improvement in overall symptoms and/or function to demonstrate improved functional mobility    Time 6    Period Weeks    Status New    Target Date 04/05/20       PT LONG TERM GOAL #2   Title Patient will be able to squat and pick up back of laundry without pain or limitations to complete required work related tasks    Time 6    Period Weeks    Status New    Target Date 04/05/20      PT LONG TERM GOAL #3   Title Patient will be able to walk without limp or altered gait mechanics to improve ambulatory mobility    Time 6    Period Weeks    Status New    Target Date 04/05/20                  Plan - 02/23/20 1241    Clinical Impression Statement Patient s/p right knee scope on 01/13/20. She presents with swelling throughout her right knee and altered gait mechanics secondary to pain and weakness. Educated patient on current presentation and rehabilitation post surgery. Patient would benefit from skilled physical therapy to improve functional mobility and return her to optimal function.    Personal Factors and Comorbidities Comorbidity 1;Fitness    Examination-Activity Limitations Squat;Stairs;Stand;Transfers;Locomotion Level;Lift    Examination-Participation Restrictions Community Activity;Cleaning    Stability/Clinical Decision Making Stable/Uncomplicated    Clinical Decision Making Low    Rehab Potential Good    PT Frequency 2x / week    PT Duration 6 weeks    PT Treatment/Interventions ADLs/Self Care Home Management;Aquatic Therapy;Cryotherapy;Electrical Stimulation;Moist Heat;Traction;Balance training;Therapeutic exercise;Therapeutic activities;Functional mobility training;Stair training;Gait training;DME Instruction;Neuromuscular re-education;Patient/family education;Manual techniques;Dry needling;Passive range of motion;Joint Manipulations    PT Next Visit Plan patella mobility, knee ROM, strength    PT Home Exercise Plan heel slides, (already doing - SAQs, LAQs, SLR), elevation and rocking back and forth on bike    Consulted and Agree with Plan of Care Patient           Patient will benefit from skilled therapeutic  intervention in order to improve the following deficits and impairments:  Abnormal gait, Decreased endurance, Pain, Decreased strength, Decreased activity tolerance, Decreased mobility, Difficulty walking, Decreased range of motion  Visit Diagnosis: Chronic pain of right knee  Stiffness of right knee, not elsewhere classified  Difficulty in walking, not elsewhere classified     Problem List Patient Active Problem List   Diagnosis Date Noted  . S/P arthroscopy of right knee 01/13/20  02/16/2020  . Derangement of posterior horn of medial meniscus of right knee   . Meniscus, lateral, derangement, right   . Osteoarthritis of knee 07/19/2018  . Pain in right knee 06/11/2018  . Postablative hypothyroidism 04/22/2018  . Hyperlipemia 01/13/2016  . VITAMIN D DEFICIENCY 04/01/2008    12:44 PM, 02/23/20 Jerene Pitch, DPT Physical Therapy with [facility]  [phone] office   [facility] [address] [place], [place], [address] Phone: [phone]   Fax:  [phone]  Name: Jodi Wade MRN: [id number] Date of Birth: 27-Jan-1961

## 2020-02-24 ENCOUNTER — Encounter (HOSPITAL_COMMUNITY): Payer: Self-pay | Admitting: Physical Therapy

## 2020-02-24 ENCOUNTER — Ambulatory Visit (HOSPITAL_COMMUNITY): Payer: No Typology Code available for payment source | Admitting: Physical Therapy

## 2020-02-24 DIAGNOSIS — R262 Difficulty in walking, not elsewhere classified: Secondary | ICD-10-CM

## 2020-02-24 DIAGNOSIS — M25661 Stiffness of right knee, not elsewhere classified: Secondary | ICD-10-CM

## 2020-02-24 DIAGNOSIS — G8929 Other chronic pain: Secondary | ICD-10-CM

## 2020-02-24 DIAGNOSIS — M25561 Pain in right knee: Secondary | ICD-10-CM | POA: Diagnosis not present

## 2020-02-24 NOTE — Therapy (Signed)
[facility] [address] [place], [place], [address] Phone: [phone]   Fax:  [phone]  Physical Therapy Treatment  Patient Details  Name: Jodi Wade MRN: [id number] Date of Birth: December 10, 1960 Referring Provider (PT): Arther Abbott, MD  Knee Extension AROM;Right   Knee Extension Limitations 8   Knee Flexion AROM;Right   Knee Flexion Limitations 105    Encounter Date: 02/24/2020   PT End of Session - 02/24/20 1357    Visit Number 2    Number of Visits 12    Date for PT Re-Evaluation 04/05/20    Authorization Type [place] FOCUS, no VL, no auth    Progress Note Due on Visit 10    PT Start Time 1350    PT Stop Time 1435    PT Time Calculation (min) 45 min    Activity Tolerance Patient tolerated treatment well    Behavior During Therapy [facility] for tasks assessed/performed           Past Medical History:  Diagnosis Date  . Allergy   . Diabetes mellitus without complication ([place])   . Hypertension   . Thyroid disease     Past Surgical History:  Procedure Laterality Date  . BREAST BIOPSY Right   . KNEE ARTHROSCOPY WITH MEDIAL MENISECTOMY Right 01/13/2020   Procedure: KNEE ARTHROSCOPY WITH MEDIAL AND LATERAL  MENISCECTOMY;  Surgeon: Carole Civil, MD;  Location: [facility];  Service: Orthopedics;  Laterality: Right;  . PARTIAL HYSTERECTOMY     abdominal    There were no vitals filed for this visit.   Subjective Assessment - 02/24/20 1355    Subjective Patient reports no pain currentyy. Says knee conitnues ot be tight, and swollen. Patient says HEP is going well, no issues with these.    Currently in Pain? No/denies    Pain Onset More than a month ago                             [facility] Adult PT Treatment/Exercise - 02/24/20 0001      Knee/Hip Exercises: Stretches   Passive Hamstring Stretch Right;5 reps;10 seconds    Passive Hamstring Stretch Limitations with strap     Gastroc Stretch  Right;5 reps;10 seconds    Gastroc Stretch Limitations supine with strap       Knee/Hip Exercises: Aerobic   Recumbent Bike 5 min seat 10 (rocking initial, then full rotation)       Knee/Hip Exercises: Supine   Quad Sets Right;15 reps    Heel Slides AROM;Right;20 reps    Bridges Both;1 set;10 reps    Straight Leg Raises Right;15 reps    Knee Extension AROM;Right    Knee Extension Limitations 8    Knee Flexion AROM;Right    Knee Flexion Limitations 105      Manual Therapy   Manual Therapy Edema management    Manual therapy comments completed separate from all other activity     Edema Management retrograde message for improved fluid return of RLE with leg in elevated position                     PT Short Term Goals - 02/23/20 1237      PT SHORT TERM GOAL #1   Title Patient will report at least 50% improvement in overall symptoms and/or function to demonstrate improved functional mobility    Time 3    Period Weeks  Status New    Target Date 03/15/20      PT SHORT TERM GOAL #2   Title Patient will be able to demonstrate 0-120 degrees if right knee ROM    Time 3    Period Weeks    Status New    Target Date 03/15/20      PT SHORT TERM GOAL #3   Title Patient will be independent in self management strategies to improve quality of life and functional outcomes.    Time 3    Period Weeks    Status New    Target Date 03/15/20             PT Long Term Goals - 02/23/20 1239      PT LONG TERM GOAL #1   Title Patient will report at least 75% improvement in overall symptoms and/or function to demonstrate improved functional mobility    Time 6    Period Weeks    Status New    Target Date 04/05/20      PT LONG TERM GOAL #2   Title Patient will be able to squat and pick up back of laundry without pain or limitations to complete required work related tasks    Time 6    Period Weeks    Status New    Target Date 04/05/20      PT LONG TERM GOAL #3   Title  Patient will be able to walk without limp or altered gait mechanics to improve ambulatory mobility    Time 6    Period Weeks    Status New    Target Date 04/05/20                 Plan - 02/24/20 1443    Clinical Impression Statement Patient tolerated session well overall today. Patient continues to have notable swelling and ROM restriction. Patient was able to complete full revolution on bike today with several minutes warm up rocking with RLE. Patient had some discomfort with stretching initially but improved with verbal cues and practice. Added hamstring stretch and supine calf stretching for improved AROM. Patient educated on proper form and function of all added exercise. Patient able to improve knee AROM to 8-105 degrees this session, also circumferential edema reduced from 48cm to 46.5cm measured at mid patella post manual treatment. Patient with no reported pain at end of session. Patient educated on and issued updated HEP handout.    Personal Factors and Comorbidities Comorbidity 1;Fitness    Examination-Activity Limitations Squat;Stairs;Stand;Transfers;Locomotion Level;Lift    Examination-Participation Restrictions Community Activity;Cleaning    Stability/Clinical Decision Making Stable/Uncomplicated    Rehab Potential Good    PT Frequency 2x / week    PT Duration 6 weeks    PT Treatment/Interventions ADLs/Self Care Home Management;Aquatic Therapy;Cryotherapy;Electrical Stimulation;Moist Heat;Traction;Balance training;Therapeutic exercise;Therapeutic activities;Functional mobility training;Stair training;Gait training;DME Instruction;Neuromuscular re-education;Patient/family education;Manual techniques;Dry needling;Passive range of motion;Joint Manipulations    PT Next Visit Plan patella mobility, knee ROM, strength.    PT Home Exercise Plan heel slides, (already doing - SAQs, LAQs, SLR), elevation and rocking back and forth on bike 02/24/20: supine hamstring and claf stretching  with strap    Consulted and Agree with Plan of Care Patient           Patient will benefit from skilled therapeutic intervention in order to improve the following deficits and impairments:  Abnormal gait, Decreased endurance, Pain, Decreased strength, Decreased activity tolerance, Decreased mobility, Difficulty walking, Decreased range of motion  Visit Diagnosis:  Chronic pain of right knee  Stiffness of right knee, not elsewhere classified  Difficulty in walking, not elsewhere classified     Problem List Patient Active Problem List   Diagnosis Date Noted  . S/P arthroscopy of right knee 01/13/20  02/16/2020  . Derangement of posterior horn of medial meniscus of right knee   . Meniscus, lateral, derangement, right   . Osteoarthritis of knee 07/19/2018  . Pain in right knee 06/11/2018  . Postablative hypothyroidism 04/22/2018  . Hyperlipemia 01/13/2016  . VITAMIN D DEFICIENCY 04/01/2008    3:01 PM, 02/24/20 Josue Hector PT DPT  Physical Therapist with [facility]  ([phone]) [phone] [place] [address] [place], [place], [address] Phone: [phone]   Fax:  [phone]  Name: Jodi Wade MRN: [id number] Date of Birth: Oct 25, 1960

## 2020-03-01 ENCOUNTER — Encounter (HOSPITAL_COMMUNITY): Payer: Self-pay | Admitting: Physical Therapy

## 2020-03-01 ENCOUNTER — Ambulatory Visit (HOSPITAL_COMMUNITY): Payer: No Typology Code available for payment source | Admitting: Physical Therapy

## 2020-03-01 ENCOUNTER — Other Ambulatory Visit: Payer: Self-pay

## 2020-03-01 DIAGNOSIS — G8929 Other chronic pain: Secondary | ICD-10-CM

## 2020-03-01 DIAGNOSIS — M25561 Pain in right knee: Secondary | ICD-10-CM | POA: Diagnosis not present

## 2020-03-01 DIAGNOSIS — R2689 Other abnormalities of gait and mobility: Secondary | ICD-10-CM

## 2020-03-01 DIAGNOSIS — R262 Difficulty in walking, not elsewhere classified: Secondary | ICD-10-CM

## 2020-03-01 DIAGNOSIS — M25661 Stiffness of right knee, not elsewhere classified: Secondary | ICD-10-CM

## 2020-03-01 NOTE — Therapy (Addendum)
[facility] [place], [place], [address] Phone: [phone]   Fax:  [phone]  Physical Therapy Treatment  Patient Details  Name: Jodi Wade MRN: [id number] Date of Birth: 11/15/1960 Referring Provider (PT): Arther Abbott, MD  RIGHT KNEE ROM:8-110 degrees   Encounter Date: 03/01/2020   PT End of Session - 03/01/20 2979    Visit Number 3    Number of Visits 12    Date for PT Re-Evaluation 04/05/20    Authorization Type [place] FOCUS, no VL, no auth    Progress Note Due on Visit 10    PT Start Time 0955    PT Stop Time 1040    PT Time Calculation (min) 45 min    Activity Tolerance Patient tolerated treatment well    Behavior During Therapy [facility] for tasks assessed/performed           Past Medical History:  Diagnosis Date  . Allergy   . Diabetes mellitus without complication ([place])   . Hypertension   . Thyroid disease     Past Surgical History:  Procedure Laterality Date  . BREAST BIOPSY Right   . KNEE ARTHROSCOPY WITH MEDIAL MENISECTOMY Right 01/13/2020   Procedure: KNEE ARTHROSCOPY WITH MEDIAL AND LATERAL  MENISCECTOMY;  Surgeon: Carole Civil, MD;  Location: [facility];  Service: Orthopedics;  Laterality: Right;  . PARTIAL HYSTERECTOMY     abdominal    There were no vitals filed for this visit.    Subjective Assessment - 03/01/20 1000    Subjective States she is feeling much better then she has been. Swelling has been good, she has been wearing stocking just about everyday but did not wear it today because she was attending therapy. States she has been doing her exercises. Reports no pain, states she hasn't had any pain recently that she can remember.    Currently in Pain? No/denies    Pain Onset More than a month ago              [facility] PT Assessment - 03/01/20 0001      Assessment   Medical Diagnosis R knee scope    Referring Provider (PT) Arther Abbott, MD    Onset  Date/Surgical Date 01/13/20                      Objective measurements completed on examination: See above findings.       [facility] Adult PT Treatment/Exercise - 03/01/20 0001      Ambulation/Gait   Gait Comments walking focus on heel contact - 5 minutes      Knee/Hip Exercises: Stretches   Sports administrator 30 seconds;4 reps;Right   thomas stretch with box for support    Gastroc Stretch 60 seconds;4 reps;Right   With strap      Knee/Hip Exercises: Aerobic   Recumbent Bike 5 min seat 10 (rocking initial, then full rotation) - not billed for      Knee/Hip Exercises: Seated   Long Arc Quad AROM;Right;20 reps   5" holds at end range   Sit to Sand without UE support;3 sets;10 reps   elevated surface - blue foam      Knee/Hip Exercises: Supine   Heel Slides AROM;Right;20 reps    Heel Slides Limitations focus on end range holds    Knee Extension AROM;Right    Knee Extension Limitations 8   lacking   Knee Flexion AROM;Right    Knee Flexion Limitations  110      Knee/Hip Exercises: Prone   Hamstring Curl 15 reps   R - over pressure with left leg 5" holds at end range    Other Prone Exercises TKE 2x15 5" holds R       Manual Therapy   Manual Therapy Edema management;Joint mobilization    Manual therapy comments completed separate from all other activity     Edema Management retrograde message for improved fluid return of RLE with leg in elevated position     Joint Mobilization patella mobilization in all directions R, gentle tibial mobilizations grade II PA/AP - patient supine                    PT Short Term Goals - 02/23/20 1237      PT SHORT TERM GOAL #1   Title Patient will report at least 50% improvement in overall symptoms and/or function to demonstrate improved functional mobility    Time 3    Period Weeks    Status New    Target Date 03/15/20      PT SHORT TERM GOAL #2   Title Patient will be able to demonstrate 0-120 degrees if right knee ROM     Time 3    Period Weeks    Status New    Target Date 03/15/20      PT SHORT TERM GOAL #3   Title Patient will be independent in self management strategies to improve quality of life and functional outcomes.    Time 3    Period Weeks    Status New    Target Date 03/15/20             PT Long Term Goals - 02/23/20 1239      PT LONG TERM GOAL #1   Title Patient will report at least 75% improvement in overall symptoms and/or function to demonstrate improved functional mobility    Time 6    Period Weeks    Status New    Target Date 04/05/20      PT LONG TERM GOAL #2   Title Patient will be able to squat and pick up back of laundry without pain or limitations to complete required work related tasks    Time 6    Period Weeks    Status New    Target Date 04/05/20      PT LONG TERM GOAL #3   Title Patient will be able to walk without limp or altered gait mechanics to improve ambulatory mobility    Time 6    Period Weeks    Status New    Target Date 04/05/20                  Plan - 03/01/20 1023    Clinical Impression Statement Slight increase in swelling noted during today's session. Continued limitations noted in TKE but flexion continues to improve. Added strengthening ROM within available ROM and these exercises were tolerated well. Advised patient to continue to wear compression garments even on PT days secondary to continued swelling. Fatigue in thighs noted end of session but no pain. Ended on bike but this was not billed for, able to complete full revolutions for 5 minutes. Will continue to work on ROM and strengthening as tolerated.    Personal Factors and Comorbidities Comorbidity 1;Fitness    Examination-Activity Limitations Squat;Stairs;Stand;Transfers;Locomotion Level;Lift    Examination-Participation Restrictions Community Activity;Cleaning    Stability/Clinical Decision Making Stable/Uncomplicated  Rehab Potential Good    PT Frequency 2x / week    PT  Duration 6 weeks    PT Treatment/Interventions ADLs/Self Care Home Management;Aquatic Therapy;Cryotherapy;Electrical Stimulation;Moist Heat;Traction;Balance training;Therapeutic exercise;Therapeutic activities;Functional mobility training;Stair training;Gait training;DME Instruction;Neuromuscular re-education;Patient/family education;Manual techniques;Dry needling;Passive range of motion;Joint Manipulations    PT Next Visit Plan patella mobility, knee ROM, strength. TKE    PT Home Exercise Plan heel slides, (already doing - SAQs, LAQs, SLR), elevation and rocking back and forth on bike 02/24/20: supine hamstring and calf stretching with strap    Consulted and Agree with Plan of Care Patient           Patient will benefit from skilled therapeutic intervention in order to improve the following deficits and impairments:  Abnormal gait, Decreased endurance, Pain, Decreased strength, Decreased activity tolerance, Decreased mobility, Difficulty walking, Decreased range of motion  Visit Diagnosis: Chronic pain of right knee  Stiffness of right knee, not elsewhere classified  Difficulty in walking, not elsewhere classified  Other abnormalities of gait and mobility     Problem List Patient Active Problem List   Diagnosis Date Noted  . S/P arthroscopy of right knee 01/13/20  02/16/2020  . Derangement of posterior horn of medial meniscus of right knee   . Meniscus, lateral, derangement, right   . Osteoarthritis of knee 07/19/2018  . Pain in right knee 06/11/2018  . Postablative hypothyroidism 04/22/2018  . Hyperlipemia 01/13/2016  . VITAMIN D DEFICIENCY 04/01/2008   10:44 AM, 03/01/20 Jerene Pitch, DPT Physical Therapy with [facility]  [phone] office   [facility] [address] [place], [place], [address] Phone: [phone]   Fax:  [phone]  Name: Jodi Wade MRN: [id number] Date of Birth:  1960-11-05

## 2020-03-04 ENCOUNTER — Ambulatory Visit (HOSPITAL_COMMUNITY): Payer: No Typology Code available for payment source

## 2020-03-04 ENCOUNTER — Encounter (HOSPITAL_COMMUNITY): Payer: Self-pay

## 2020-03-04 ENCOUNTER — Other Ambulatory Visit: Payer: Self-pay

## 2020-03-04 DIAGNOSIS — R2689 Other abnormalities of gait and mobility: Secondary | ICD-10-CM

## 2020-03-04 DIAGNOSIS — R262 Difficulty in walking, not elsewhere classified: Secondary | ICD-10-CM

## 2020-03-04 DIAGNOSIS — M25661 Stiffness of right knee, not elsewhere classified: Secondary | ICD-10-CM

## 2020-03-04 DIAGNOSIS — M25561 Pain in right knee: Secondary | ICD-10-CM | POA: Diagnosis not present

## 2020-03-04 DIAGNOSIS — G8929 Other chronic pain: Secondary | ICD-10-CM

## 2020-03-04 NOTE — Therapy (Signed)
[place], [place], [address] Phone: [phone]   Fax:  [phone]  Physical Therapy Treatment  Patient Details  Name: Jodi Wade MRN: [id number] Date of Birth: [birth year] Referring Provider (PT): Arther Abbott, MD   Encounter Date: 03/04/2020   PT End of Session - 03/04/20 1134    Visit Number 4    Number of Visits 12    Date for PT Re-Evaluation 04/05/20    Authorization Type [place] FOCUS, no VL, no auth    Progress Note Due on Visit 10    PT Start Time 1051    PT Stop Time 1134    PT Time Calculation (min) 43 min    Activity Tolerance Patient tolerated treatment well    Behavior During Therapy [facility] for tasks assessed/performed           Past Medical History:  Diagnosis Date  . Allergy   . Diabetes mellitus without complication ([place])   . Hypertension   . Thyroid disease     Past Surgical History:  Procedure Laterality Date  . BREAST BIOPSY Right   . KNEE ARTHROSCOPY WITH MEDIAL MENISECTOMY Right 01/13/2020   Procedure: KNEE ARTHROSCOPY WITH MEDIAL AND LATERAL  MENISCECTOMY;  Surgeon: Carole Civil, MD;  Location: [facility];  Service: Orthopedics;  Laterality: Right;  . PARTIAL HYSTERECTOMY     abdominal    There were no vitals filed for this visit.   Subjective Assessment - 03/04/20 1049    Subjective Stated knee is feeling good today.  Swelling has been reducing, stated her compression stocking have been sliding down her thigh.  Stated she feels her walking has improved.  Has been doing her exercises daily.    Currently in Pain? No/denies                             [facility] Adult PT Treatment/Exercise - 03/04/20 0001      Knee/Hip Exercises: Stretches   Active Hamstring Stretch 2 reps;30 seconds    Active Hamstring Stretch Limitations 12in step height     Knee: Self-Stretch to increase Flexion 5 reps;10 seconds    Knee: Self-Stretch Limitations knee drive  for flexion 5x 10"    Gastroc Stretch 3 reps;30 seconds    Gastroc Stretch Limitations slant board      Knee/Hip Exercises: Standing   Heel Raises Limitations toe raises on elevation    Terminal Knee Extension Right;10 reps;Theraband    Theraband Level (Terminal Knee Extension) Other (comment)    Terminal Knee Extension Limitations therapist's hand behind knee then purple band 5" holds      Knee/Hip Exercises: Seated   Sit to Sand without UE support;10 reps   eccentric control     Knee/Hip Exercises: Supine   Heel Slides 10 reps    Heel Slides Limitations focus on end range holds    Knee Extension Limitations 7    Knee Flexion Limitations 113      Manual Therapy   Manual Therapy Edema management;Joint mobilization;Myofascial release;Other (comment)    Manual therapy comments completed separate from all other activity     Edema Management retrograde message for improved fluid return of RLE with leg in elevated position     Joint Mobilization patella mobilization in all directions R, gentle tibial mobilizations grade II PA/AP - patient supine    Myofascial Release scar tissue     Other Manual Therapy  measured for compression garment                  PT Education - 03/04/20 1308    Education Details Discussed appropriate fit for compression garments, measurements taken for thigh high compression hose and given printout for [place] on scar tissue self massage techniques to assist.  Reviewed RICE techniques for pain and edema control.    Person(s) Educated Patient    Methods Explanation    Comprehension Verbalized understanding;Returned demonstration            PT Short Term Goals - 02/23/20 1237      PT SHORT TERM GOAL #1   Title Patient will report at least 50% improvement in overall symptoms and/or function to demonstrate improved functional mobility    Time 3    Period Weeks    Status New    Target Date 03/15/20      PT SHORT TERM GOAL #2    Title Patient will be able to demonstrate 0-120 degrees if right knee ROM    Time 3    Period Weeks    Status New    Target Date 03/15/20      PT SHORT TERM GOAL #3   Title Patient will be independent in self management strategies to improve quality of life and functional outcomes.    Time 3    Period Weeks    Status New    Target Date 03/15/20             PT Long Term Goals - 02/23/20 1239      PT LONG TERM GOAL #1   Title Patient will report at least 75% improvement in overall symptoms and/or function to demonstrate improved functional mobility    Time 6    Period Weeks    Status New    Target Date 04/05/20      PT LONG TERM GOAL #2   Title Patient will be able to squat and pick up back of laundry without pain or limitations to complete required work related tasks    Time 6    Period Weeks    Status New    Target Date 04/05/20      PT LONG TERM GOAL #3   Title Patient will be able to walk without limp or altered gait mechanics to improve ambulatory mobility    Time 6    Period Weeks    Status New    Target Date 04/05/20                 Plan - 03/04/20 1310    Clinical Impression Statement Pt educated on proper fit for compression garments, measurements taken and paperwork given for [employer] to assist iwht proper fit.  Discussed thigh high tendency to roll and educated may benefit from hose if continues to slide down.  Session focus with knee mobility, added knee drives for flexion and continued TKE quad strenghtening exercises and stretches to address tightness.  EOS with manual retrograde massage for edema control and educated on benefits iwth scar tissue massage to address adhesions over incision.    Personal Factors and Comorbidities Comorbidity 1;Fitness    Examination-Activity Limitations Squat;Stairs;Stand;Transfers;Locomotion Level;Lift    Examination-Participation Restrictions Community Activity;Cleaning    Stability/Clinical Decision  Making Stable/Uncomplicated    Clinical Decision Making Low    Rehab Potential Good    PT Frequency 2x / week    PT Duration 6 weeks  PT Treatment/Interventions ADLs/Self Care Home Management;Aquatic Therapy;Cryotherapy;Electrical Stimulation;Moist Heat;Traction;Balance training;Therapeutic exercise;Therapeutic activities;Functional mobility training;Stair training;Gait training;DME Instruction;Neuromuscular re-education;Patient/family education;Manual techniques;Dry needling;Passive range of motion;Joint Manipulations    PT Next Visit Plan patella mobility, knee ROM, strength. TKE    PT Home Exercise Plan heel slides, (already doing - SAQs, LAQs, SLR), elevation and rocking back and forth on bike 02/24/20: supine hamstring and calf stretching with strap           Patient will benefit from skilled therapeutic intervention in order to improve the following deficits and impairments:  Abnormal gait, Decreased endurance, Pain, Decreased strength, Decreased activity tolerance, Decreased mobility, Difficulty walking, Decreased range of motion  Visit Diagnosis: Stiffness of right knee, not elsewhere classified  Difficulty in walking, not elsewhere classified  Other abnormalities of gait and mobility  Chronic pain of right knee     Problem List Patient Active Problem List   Diagnosis Date Noted  . S/P arthroscopy of right knee 01/13/20  02/16/2020  . Derangement of posterior horn of medial meniscus of right knee   . Meniscus, lateral, derangement, right   . Osteoarthritis of knee 07/19/2018  . Pain in right knee 06/11/2018  . Postablative hypothyroidism 04/22/2018  . Hyperlipemia 01/13/2016  . VITAMIN D DEFICIENCY 04/01/2008   Ihor Austin, LPTA/CLT; CBIS [phone]  Aldona Lento 03/04/2020, 4:35 PM  [place] [address] [place], [place], [address] Phone: [phone]   Fax:  [phone]  Name: Analicia Skibinski MRN: [id number] Date of Birth: 11-Jan-1961

## 2020-03-09 ENCOUNTER — Ambulatory Visit (HOSPITAL_COMMUNITY): Payer: No Typology Code available for payment source | Attending: Orthopedic Surgery | Admitting: Physical Therapy

## 2020-03-09 ENCOUNTER — Other Ambulatory Visit: Payer: Self-pay

## 2020-03-09 ENCOUNTER — Telehealth (HOSPITAL_COMMUNITY): Payer: Self-pay | Admitting: Physical Therapy

## 2020-03-09 ENCOUNTER — Encounter (HOSPITAL_COMMUNITY): Payer: Self-pay | Admitting: Physical Therapy

## 2020-03-09 DIAGNOSIS — M25561 Pain in right knee: Secondary | ICD-10-CM | POA: Diagnosis present

## 2020-03-09 DIAGNOSIS — M25661 Stiffness of right knee, not elsewhere classified: Secondary | ICD-10-CM | POA: Insufficient documentation

## 2020-03-09 DIAGNOSIS — R2689 Other abnormalities of gait and mobility: Secondary | ICD-10-CM | POA: Insufficient documentation

## 2020-03-09 DIAGNOSIS — G8929 Other chronic pain: Secondary | ICD-10-CM | POA: Insufficient documentation

## 2020-03-09 DIAGNOSIS — R262 Difficulty in walking, not elsewhere classified: Secondary | ICD-10-CM | POA: Diagnosis present

## 2020-03-09 NOTE — Therapy (Signed)
[place], [place], [address] Phone: [phone]   Fax:  [phone]  Physical Therapy Treatment  Patient Details  Name: Jodi Wade MRN: [id number] Date of Birth: 1961/02/18 Referring Provider (PT): Arther Abbott, MD   Encounter Date: 03/09/2020   PT End of Session - 03/09/20 1034    Visit Number 5    Number of Visits 12    Date for PT Re-Evaluation 04/05/20    Authorization Type [place] FOCUS, no VL, no auth    Progress Note Due on Visit 10    PT Start Time 1045    PT Stop Time 1125    PT Time Calculation (min) 40 min    Activity Tolerance Patient tolerated treatment well    Behavior During Therapy [facility] for tasks assessed/performed           Past Medical History:  Diagnosis Date   Allergy    Diabetes mellitus without complication ([place])    Hypertension    Thyroid disease     Past Surgical History:  Procedure Laterality Date   BREAST BIOPSY Right    KNEE ARTHROSCOPY WITH MEDIAL MENISECTOMY Right 01/13/2020   Procedure: KNEE ARTHROSCOPY WITH MEDIAL AND LATERAL  MENISCECTOMY;  Surgeon: Carole Civil, MD;  Location: [facility];  Service: Orthopedics;  Laterality: Right;   PARTIAL HYSTERECTOMY     abdominal    There were no vitals filed for this visit.   Subjective Assessment - 03/09/20 1048    Subjective States she did the bike for 5 minutes this morning. states she had minor pain this morning but the bike worked it out and is in no pain currently. States she feels she is getting her motion back and she is no longer limping. States no difficulties with exercises.    Currently in Pain? No/denies              [facility] PT Assessment - 03/09/20 0001      Assessment   Medical Diagnosis R knee scope    Referring Provider (PT) Arther Abbott, MD                         [facility] Adult PT Treatment/Exercise - 03/09/20 0001      Knee/Hip Exercises: Stretches   Quad  Stretch 30 seconds;Right;3 reps    Dispensing optician       Knee/Hip Exercises: Standing   Lateral Step Up 3 sets    Forward Step Up Both;3 sets;Hand Hold: 0;10 reps;Step Height: 6"    Other Standing Knee Exercises heel walk x6 50 feet with UE support      Knee/Hip Exercises: Seated   Knee/Hip Flexion x10 right 5" holds     Sit to Sand without UE support;10 reps;3 sets   slow eccentric lower     Knee/Hip Exercises: Supine   Quad Sets 20 reps;Both   legs on green ball x15    Heel Slides 10 reps    Heel Slides Limitations focus on end range holds    Knee Extension AROM;Right    Knee Extension Limitations 7    Knee Flexion AROM;Right    Knee Flexion Limitations 118    Other Supine Knee/Hip Exercises hamstring curls on green ball 4x5      Manual Therapy   Manual Therapy Soft tissue mobilization    Manual therapy comments completed separate from all other activity     Joint Mobilization  patella mobilization in all directions R, gentle tibial mobilizations grade II PA/AP - patient supine    Soft tissue mobilization to distal quad and hamstrings - tolerated well                   PT Education - 03/09/20 1111    Education Details educated patient on discontinuing towel roll underneath knee at night secondary to continued TKE limitations.    Person(s) Educated Patient    Methods Explanation    Comprehension Verbalized understanding            PT Short Term Goals - 02/23/20 1237      PT SHORT TERM GOAL #1   Title Patient will report at least 50% improvement in overall symptoms and/or function to demonstrate improved functional mobility    Time 3    Period Weeks    Status New    Target Date 03/15/20      PT SHORT TERM GOAL #2   Title Patient will be able to demonstrate 0-120 degrees if right knee ROM    Time 3    Period Weeks    Status New    Target Date 03/15/20      PT SHORT TERM GOAL #3   Title Patient will be independent in self management  strategies to improve quality of life and functional outcomes.    Time 3    Period Weeks    Status New    Target Date 03/15/20             PT Long Term Goals - 02/23/20 1239      PT LONG TERM GOAL #1   Title Patient will report at least 75% improvement in overall symptoms and/or function to demonstrate improved functional mobility    Time 6    Period Weeks    Status New    Target Date 04/05/20      PT LONG TERM GOAL #2   Title Patient will be able to squat and pick up back of laundry without pain or limitations to complete required work related tasks    Time 6    Period Weeks    Status New    Target Date 04/05/20      PT LONG TERM GOAL #3   Title Patient will be able to walk without limp or altered gait mechanics to improve ambulatory mobility    Time 6    Period Weeks    Status New    Target Date 04/05/20                 Plan - 03/09/20 1119    Clinical Impression Statement Added step ups on 6 step and this was tolerated well but right leg did fatigue with exercise. Patient continues to improve in functional mobility. Added ball curls on green ball and quad sets on ball curls. Initially this was challenging for patient but became less challenging with repetition  though patient did report fatigue during exercise at end of session. Will continue with current POC    Personal Factors and Comorbidities Comorbidity 1;Fitness    Examination-Activity Limitations Squat;Stairs;Stand;Transfers;Locomotion Level;Lift    Examination-Participation Restrictions Community Activity;Cleaning    Stability/Clinical Decision Making Stable/Uncomplicated    Rehab Potential Good    PT Frequency 2x / week    PT Duration 6 weeks    PT Treatment/Interventions ADLs/Self Care Home Management;Aquatic Therapy;Cryotherapy;Electrical Stimulation;Moist Heat;Traction;Balance training;Therapeutic exercise;Therapeutic activities;Functional mobility training;Stair training;Gait training;DME  Instruction;Neuromuscular re-education;Patient/family education;Manual techniques;Dry needling;Passive range  of motion;Joint Manipulations    PT Next Visit Plan patella mobility, knee ROM, strength. TKE    PT Home Exercise Plan heel slides, (already doing - SAQs, LAQs, SLR), elevation and rocking back and forth on bike 02/24/20: supine hamstring and calf stretching with strap; 10/5 eccentric STS, heel walk with support           Patient will benefit from skilled therapeutic intervention in order to improve the following deficits and impairments:  Abnormal gait, Decreased endurance, Pain, Decreased strength, Decreased activity tolerance, Decreased mobility, Difficulty walking, Decreased range of motion  Visit Diagnosis: Stiffness of right knee, not elsewhere classified  Difficulty in walking, not elsewhere classified     Problem List Patient Active Problem List   Diagnosis Date Noted   S/P arthroscopy of right knee 01/13/20  02/16/2020   Derangement of posterior horn of medial meniscus of right knee    Meniscus, lateral, derangement, right    Osteoarthritis of knee 07/19/2018   Pain in right knee 06/11/2018   Postablative hypothyroidism 04/22/2018   Hyperlipemia 01/13/2016   VITAMIN D DEFICIENCY 04/01/2008   11:25 AM, 03/09/20 Jerene Pitch, DPT Physical Therapy with [facility]  [phone] office  [place] [address] [place], [place], [address] Phone: [phone]   Fax:  [phone]  Name: Jodi Wade MRN: [id number] Date of Birth: February 07, 1961

## 2020-03-09 NOTE — Telephone Encounter (Signed)
Patient wants to rest and will be back at her next apptment time.

## 2020-03-11 ENCOUNTER — Encounter (HOSPITAL_COMMUNITY): Payer: Self-pay | Admitting: Physical Therapy

## 2020-03-16 ENCOUNTER — Telehealth (HOSPITAL_COMMUNITY): Payer: Self-pay

## 2020-03-16 ENCOUNTER — Ambulatory Visit (HOSPITAL_COMMUNITY): Payer: No Typology Code available for payment source

## 2020-03-16 NOTE — Telephone Encounter (Signed)
Pt states she's not feeling like doing PT now.

## 2020-03-18 ENCOUNTER — Other Ambulatory Visit: Payer: Self-pay

## 2020-03-18 ENCOUNTER — Encounter (HOSPITAL_COMMUNITY): Payer: Self-pay

## 2020-03-18 ENCOUNTER — Ambulatory Visit (HOSPITAL_COMMUNITY): Payer: No Typology Code available for payment source

## 2020-03-18 DIAGNOSIS — M25661 Stiffness of right knee, not elsewhere classified: Secondary | ICD-10-CM

## 2020-03-18 DIAGNOSIS — G8929 Other chronic pain: Secondary | ICD-10-CM

## 2020-03-18 DIAGNOSIS — R2689 Other abnormalities of gait and mobility: Secondary | ICD-10-CM

## 2020-03-18 DIAGNOSIS — R262 Difficulty in walking, not elsewhere classified: Secondary | ICD-10-CM

## 2020-03-18 NOTE — Patient Instructions (Signed)
Sitting on couch prop heel up onto coffee table to help with knee extension, increase time slowly as tolerated.

## 2020-03-18 NOTE — Therapy (Signed)
[place] [address] [place], [place], [address] Phone: [phone]   Fax:  [phone]  Physical Therapy Treatment  Patient Details  Name: Jodi Wade MRN: [id number] Date of Birth: [birth year] Referring Provider (PT): Arther Abbott, MD   Encounter Date: 03/18/2020   PT End of Session - 03/18/20 1049    Visit Number 6    Number of Visits 12    Date for PT Re-Evaluation 04/05/20    Authorization Type [place] FOCUS, no VL, no auth    Progress Note Due on Visit 10    PT Start Time 1045    PT Stop Time 1125    PT Time Calculation (min) 40 min    Activity Tolerance Patient tolerated treatment well    Behavior During Therapy [facility] for tasks assessed/performed           Past Medical History:  Diagnosis Date  . Allergy   . Diabetes mellitus without complication ([place])   . Hypertension   . Thyroid disease     Past Surgical History:  Procedure Laterality Date  . BREAST BIOPSY Right   . KNEE ARTHROSCOPY WITH MEDIAL MENISECTOMY Right 01/13/2020   Procedure: KNEE ARTHROSCOPY WITH MEDIAL AND LATERAL  MENISCECTOMY;  Surgeon: Carole Civil, MD;  Location: [facility];  Service: Orthopedics;  Laterality: Right;  . PARTIAL HYSTERECTOMY     abdominal    There were no vitals filed for this visit.   Subjective Assessment - 03/18/20 1048    Subjective Pt stated her knee is feeling good today, feels looser and swelling is going down.    Currently in Pain? No/denies                             [facility] Adult PT Treatment/Exercise - 03/18/20 0001      Knee/Hip Exercises: Stretches   Active Hamstring Stretch 2 reps;30 seconds    Active Hamstring Stretch Limitations 12in step height     Knee: Self-Stretch to increase Flexion 5 reps;10 seconds    Knee: Self-Stretch Limitations knee drive for flexion 5x 10"    Gastroc Stretch 3 reps;30 seconds    Gastroc Stretch Limitations slant board      Knee/Hip  Exercises: Standing   Heel Raises 15 reps    Heel Raises Limitations incline slope    Terminal Knee Extension Right;10 reps;Theraband    Theraband Level (Terminal Knee Extension) Other (comment)    Terminal Knee Extension Limitations purple band 5" holds    Lateral Step Up Right;2 sets;10 reps;Hand Hold: 2;Step Height: 4"    Forward Step Up Right;15 reps;Hand Hold: 1;Hand Hold: 0;Step Height: 6"    Other Standing Knee Exercises heel walk 2RT dowm blue line      Knee/Hip Exercises: Seated   Other Seated Knee/Hip Exercises heel prop x 3'    Sit to [employer] without UE support;10 reps;2 sets   eccentric control     Knee/Hip Exercises: Supine   Short Arc [employer] 15 reps    Short Arc Quad Sets Limitations 5" holds    Heel Slides 10 reps    Heel Slides Limitations focus on end range holds    Knee Extension AROM;Right    Knee Extension Limitations 5    Knee Flexion AROM;Right    Knee Flexion Limitations 120      Manual Therapy   Manual Therapy Soft tissue mobilization    Manual therapy comments  completed separate from all other activity     Joint Mobilization patella mobilization in all directions R, gentle tibial mobilizations grade II PA/AP - patient supine    Soft tissue mobilization to distal quad and hamstrings - tolerated well                   PT Education - 03/18/20 1131    Education Details Reviewed purpose and appropriate wear time wiht compression hose for edema control    Methods Explanation    Comprehension Verbalized understanding            PT Short Term Goals - 02/23/20 1237      PT SHORT TERM GOAL #1   Title Patient will report at least 50% improvement in overall symptoms and/or function to demonstrate improved functional mobility    Time 3    Period Weeks    Status New    Target Date 03/15/20      PT SHORT TERM GOAL #2   Title Patient will be able to demonstrate 0-120 degrees if right knee ROM    Time 3    Period Weeks    Status New    Target  Date 03/15/20      PT SHORT TERM GOAL #3   Title Patient will be independent in self management strategies to improve quality of life and functional outcomes.    Time 3    Period Weeks    Status New    Target Date 03/15/20             PT Long Term Goals - 02/23/20 1239      PT LONG TERM GOAL #1   Title Patient will report at least 75% improvement in overall symptoms and/or function to demonstrate improved functional mobility    Time 6    Period Weeks    Status New    Target Date 04/05/20      PT LONG TERM GOAL #2   Title Patient will be able to squat and pick up back of laundry without pain or limitations to complete required work related tasks    Time 6    Period Weeks    Status New    Target Date 04/05/20      PT LONG TERM GOAL #3   Title Patient will be able to walk without limp or altered gait mechanics to improve ambulatory mobility    Time 6    Period Weeks    Status New    Target Date 04/05/20                 Plan - 03/18/20 1122    Clinical Impression Statement Session focus on knee mobility primarly extension and functional strengthening.  Pt able to complete forward/lateral step ups with good mechanics following cueing for eccentric control.  AROM improving to 5-120 degrees.  Pt reports she has ordered compression hose, discussed proper wearing time and purpose with verbalized understanding.  Minimal edema present proximal knee and good patella mobility noted.  Added heel prop to HEP to improve knee extension.  EOS pt reports she feels ready to reduce frequency to 1x/week.  Pt does continue to demonstrate weakness in quadriceps.    Personal Factors and Comorbidities Comorbidity 1;Fitness    Examination-Activity Limitations Squat;Stairs;Stand;Transfers;Locomotion Level;Lift    Examination-Participation Restrictions Community Activity;Cleaning    Stability/Clinical Decision Making Stable/Uncomplicated    Clinical Decision Making Low    Rehab Potential Good     PT Frequency  2x / week    PT Duration 6 weeks    PT Treatment/Interventions ADLs/Self Care Home Management;Aquatic Therapy;Cryotherapy;Electrical Stimulation;Moist Heat;Traction;Balance training;Therapeutic exercise;Therapeutic activities;Functional mobility training;Stair training;Gait training;DME Instruction;Neuromuscular re-education;Patient/family education;Manual techniques;Dry needling;Passive range of motion;Joint Manipulations    PT Next Visit Plan patella mobility, knee ROM, strength. TKE    PT Home Exercise Plan heel slides, (already doing - SAQs, LAQs, SLR), elevation and rocking back and forth on bike 02/24/20: supine hamstring and calf stretching with strap; 10/5 eccentric STS, heel walk with support; 10/14: heel prop           Patient will benefit from skilled therapeutic intervention in order to improve the following deficits and impairments:  Abnormal gait, Decreased endurance, Pain, Decreased strength, Decreased activity tolerance, Decreased mobility, Difficulty walking, Decreased range of motion  Visit Diagnosis: Other abnormalities of gait and mobility  Chronic pain of right knee  Stiffness of right knee, not elsewhere classified  Difficulty in walking, not elsewhere classified     Problem List Patient Active Problem List   Diagnosis Date Noted  . S/P arthroscopy of right knee 01/13/20  02/16/2020  . Derangement of posterior horn of medial meniscus of right knee   . Meniscus, lateral, derangement, right   . Osteoarthritis of knee 07/19/2018  . Pain in right knee 06/11/2018  . Postablative hypothyroidism 04/22/2018  . Hyperlipemia 01/13/2016  . VITAMIN D DEFICIENCY 04/01/2008   Ihor Austin, LPTA/CLT; CBIS [phone]  Aldona Lento 03/18/2020, 11:32 AM  [place], [place], [address] Phone: [phone]   Fax:  [phone]  Name: Adean Milosevic MRN: [id number] Date of  Birth: 1960/11/30

## 2020-03-23 ENCOUNTER — Ambulatory Visit (HOSPITAL_COMMUNITY): Payer: No Typology Code available for payment source | Admitting: Physical Therapy

## 2020-03-23 ENCOUNTER — Encounter (HOSPITAL_COMMUNITY): Payer: Self-pay | Admitting: Physical Therapy

## 2020-03-23 ENCOUNTER — Other Ambulatory Visit: Payer: Self-pay

## 2020-03-23 DIAGNOSIS — M25661 Stiffness of right knee, not elsewhere classified: Secondary | ICD-10-CM

## 2020-03-23 DIAGNOSIS — R2689 Other abnormalities of gait and mobility: Secondary | ICD-10-CM

## 2020-03-23 DIAGNOSIS — R262 Difficulty in walking, not elsewhere classified: Secondary | ICD-10-CM

## 2020-03-23 DIAGNOSIS — G8929 Other chronic pain: Secondary | ICD-10-CM

## 2020-03-23 DIAGNOSIS — M25561 Pain in right knee: Secondary | ICD-10-CM

## 2020-03-23 NOTE — Therapy (Signed)
[place], [place], [address] Phone: [phone]   Fax:  [phone]  Physical Therapy Treatment  Patient Details  Name: Jodi Wade MRN: [id number] Date of Birth: [birth year] Referring Provider (PT): Arther Abbott, MD   Encounter Date: 03/23/2020   PT End of Session - 03/23/20 1044    Visit Number 7    Number of Visits 12    Date for PT Re-Evaluation 04/05/20    Authorization Type  FOCUS, no VL, no auth    Progress Note Due on Visit 10    PT Start Time 1045    PT Stop Time 1125    PT Time Calculation (min) 40 min    Activity Tolerance Patient tolerated treatment well    Behavior During Therapy [facility] for tasks assessed/performed           Past Medical History:  Diagnosis Date  . Allergy   . Diabetes mellitus without complication ([place])   . Hypertension   . Thyroid disease     Past Surgical History:  Procedure Laterality Date  . BREAST BIOPSY Right   . KNEE ARTHROSCOPY WITH MEDIAL MENISECTOMY Right 01/13/2020   Procedure: KNEE ARTHROSCOPY WITH MEDIAL AND LATERAL  MENISCECTOMY;  Surgeon: Carole Civil, MD;  Location: [facility];  Service: Orthopedics;  Laterality: Right;  . PARTIAL HYSTERECTOMY     abdominal    There were no vitals filed for this visit.   Subjective Assessment - 03/23/20 1133    Subjective States she has been doing her exercises and is a little sore today. States that she feels the swelling is pretty good. States that she is still wearing her compression garments. States that she has been doing the laundry but she still has to be careful with it though because she wants to make sure she doesn't fall. States that she Is afraid her knee is going to give out like it did before. States she feels 85-90% better              [facility] PT Assessment - 03/23/20 0001      Assessment   Medical Diagnosis R knee scope    Referring Provider (PT) Arther Abbott, MD    Onset  Date/Surgical Date 01/13/20      Observation/Other Assessments   Focus on Therapeutic Outcomes (FOTO)  84% function    was 48.15% function                         [facility] Adult PT Treatment/Exercise - 03/23/20 0001      Knee/Hip Exercises: Standing   Other Standing Knee Exercises squats - 2x10 with pause at bottom - focus on mechanics      Knee/Hip Exercises: Supine   Heel Slides 10 reps;Right   10 seconds   Heel Slides Limitations focus on end range holds    Knee Extension AROM;Right    Knee Extension Limitations 8    Knee Flexion AROM;Right    Knee Flexion Limitations 112--> 122    Other Supine Knee/Hip Exercises hamstring curls on green ball 4x5, 10" hold at each range       Manual Therapy   Manual Therapy Joint mobilization;Soft tissue mobilization    Joint Mobilization patella mobilization in all directions     Soft tissue mobilization to distal quad and hamstrings - tolerated well  PT Education - 03/23/20 1132    Education Details Reviewed walking program and answered all questions and concerns about falling. Reviewed FOTO and plan moving forward with focus on body mechanics.    Person(s) Educated Patient    Methods Explanation    Comprehension Verbalized understanding            PT Short Term Goals - 03/23/20 1112      PT SHORT TERM GOAL #1   Title Patient will report at least 50% improvement in overall symptoms and/or function to demonstrate improved functional mobility    Time 3    Period Weeks    Status Achieved    Target Date 03/15/20      PT SHORT TERM GOAL #2   Title Patient will be able to demonstrate 0-120 degrees if right knee ROM    Time 3    Period Weeks    Status On-going    Target Date 03/15/20      PT SHORT TERM GOAL #3   Title Patient will be independent in self management strategies to improve quality of life and functional outcomes.    Time 3    Period Weeks    Status Achieved    Target Date  03/15/20             PT Long Term Goals - 03/23/20 1113      PT LONG TERM GOAL #1   Title Patient will report at least 75% improvement in overall symptoms and/or function to demonstrate improved functional mobility    Time 6    Period Weeks    Status Achieved      PT LONG TERM GOAL #2   Title Patient will be able to squat and pick up back of laundry without pain or limitations to complete required work related tasks    Time 6    Period Weeks    Status Achieved      PT LONG TERM GOAL #3   Title Patient will be able to walk without limp or altered gait mechanics to improve ambulatory mobility    Time 6    Period Weeks    Status Achieved                 Plan - 03/23/20 1303    Clinical Impression Statement Focused on education and review of goals on this date. Patient is progressing well but continues to demonstrate limitations in knee extension and squat mechanics. Discussed this and focus on these limitations over the next 2 sessions. Patient agreed to plan. Will continue with current POC as tolerated by patient.    Personal Factors and Comorbidities Comorbidity 1;Fitness    Examination-Activity Limitations Squat;Stairs;Stand;Transfers;Locomotion Level;Lift    Examination-Participation Restrictions Community Activity;Cleaning    Stability/Clinical Decision Making Stable/Uncomplicated    Rehab Potential Good    PT Frequency 2x / week    PT Duration 6 weeks    PT Treatment/Interventions ADLs/Self Care Home Management;Aquatic Therapy;Cryotherapy;Electrical Stimulation;Moist Heat;Traction;Balance training;Therapeutic exercise;Therapeutic activities;Functional mobility training;Stair training;Gait training;DME Instruction;Neuromuscular re-education;Patient/family education;Manual techniques;Dry needling;Passive range of motion;Joint Manipulations    PT Next Visit Plan focus on squat depth and body mechanics to improve lifting.    PT Home Exercise Plan heel slides, (already  doing - SAQs, LAQs, SLR), elevation and rocking back and forth on bike 02/24/20: supine hamstring and calf stretching with strap; 10/5 eccentric STS, heel walk with support; 10/14: heel prop           Patient will benefit  from skilled therapeutic intervention in order to improve the following deficits and impairments:  Abnormal gait, Decreased endurance, Pain, Decreased strength, Decreased activity tolerance, Decreased mobility, Difficulty walking, Decreased range of motion  Visit Diagnosis: Other abnormalities of gait and mobility  Chronic pain of right knee  Stiffness of right knee, not elsewhere classified  Difficulty in walking, not elsewhere classified     Problem List Patient Active Problem List   Diagnosis Date Noted  . S/P arthroscopy of right knee 01/13/20  02/16/2020  . Derangement of posterior horn of medial meniscus of right knee   . Meniscus, lateral, derangement, right   . Osteoarthritis of knee 07/19/2018  . Pain in right knee 06/11/2018  . Postablative hypothyroidism 04/22/2018  . Hyperlipemia 01/13/2016  . VITAMIN D DEFICIENCY 04/01/2008   1:09 PM, 03/23/20 Jerene Pitch, DPT Physical Therapy with [facility]  [phone] office  [place] [address] [place], [place], [address] Phone: [phone]   Fax:  [phone]  Name: Jodi Wade MRN: [id number] Date of Birth: 11/14/1960

## 2020-03-25 ENCOUNTER — Ambulatory Visit (HOSPITAL_COMMUNITY): Payer: No Typology Code available for payment source

## 2020-03-26 MED FILL — LISINOPRIL 20 MG TABLET: 20 | 90 days supply | Qty: 90 | Fill #2

## 2020-03-26 MED FILL — AMLODIPINE BESYLATE 10 MG T: 10 | 90 days supply | Qty: 90 | Fill #2

## 2020-03-26 MED FILL — LEVOTHYROXINE 88 MCG TABLET: 88 | 90 days supply | Qty: 90 | Fill #1

## 2020-03-30 ENCOUNTER — Encounter (HOSPITAL_COMMUNITY): Payer: Self-pay | Admitting: Physical Therapy

## 2020-03-30 ENCOUNTER — Ambulatory Visit (HOSPITAL_COMMUNITY): Payer: No Typology Code available for payment source | Admitting: Physical Therapy

## 2020-03-30 ENCOUNTER — Other Ambulatory Visit: Payer: Self-pay

## 2020-03-30 DIAGNOSIS — M25661 Stiffness of right knee, not elsewhere classified: Secondary | ICD-10-CM | POA: Diagnosis not present

## 2020-03-30 DIAGNOSIS — R262 Difficulty in walking, not elsewhere classified: Secondary | ICD-10-CM

## 2020-03-30 DIAGNOSIS — R2689 Other abnormalities of gait and mobility: Secondary | ICD-10-CM

## 2020-03-30 DIAGNOSIS — G8929 Other chronic pain: Secondary | ICD-10-CM

## 2020-03-30 NOTE — Therapy (Addendum)
[place] [address] [place], [place], [address] Phone: [phone]   Fax:  [phone]  Physical Therapy Treatment and Discharge Note  Patient Details  Name: Jodi Wade MRN: [id number] Date of Birth: [birth year] Referring Provider (PT): Arther Abbott, MD   PHYSICAL THERAPY DISCHARGE SUMMARY  Visits from Start of Care: 8  Current functional level related to goals / functional outcomes: Unable to assess due to unplanned discharge   Remaining deficits: Unable to assess due to unplanned discharge   Education / Equipment: Unable to assess due to unplanned discharge Plan: Patient agrees to discharge.  Patient goals were partially met. Patient is being discharged due to being pleased with the current functional level.  ?????    10:15 AM, 07/06/20 Jerene Pitch, DPT Physical Therapy with [facility]  [phone] office                    Encounter Date: 03/30/2020   PT End of Session - 03/30/20 0919    Visit Number 8    Number of Visits 12    Date for PT Re-Evaluation 04/05/20    Authorization Type [place] FOCUS, no VL, no auth    Progress Note Due on Visit 10    PT Start Time 0915    PT Stop Time 0958    PT Time Calculation (min) 43 min    Activity Tolerance Patient tolerated treatment well    Behavior During Therapy [facility] for tasks assessed/performed           Past Medical History:  Diagnosis Date  . Allergy   . Diabetes mellitus without complication ([place])   . Hypertension   . Thyroid disease     Past Surgical History:  Procedure Laterality Date  . BREAST BIOPSY Right   . KNEE ARTHROSCOPY WITH MEDIAL MENISECTOMY Right 01/13/2020   Procedure: KNEE ARTHROSCOPY WITH MEDIAL AND LATERAL  MENISCECTOMY;  Surgeon: Carole Civil, MD;  Location: [facility];  Service: Orthopedics;  Laterality: Right;  . PARTIAL HYSTERECTOMY     abdominal    There were no vitals filed  for this visit.   Subjective Assessment - 03/30/20 0918    Subjective States that she has been wearing her new compression hoes and they fit well. reports no difficulties since last session. States she lifted heavy bags yesterday with no difficulties    Currently in Pain? No/denies              [facility] PT Assessment - 03/30/20 0001      Assessment   Medical Diagnosis s/p R knee scope 01/13/20    Referring Provider (PT) Arther Abbott, MD    Onset Date/Surgical Date 01/13/20                         [facility] Adult PT Treatment/Exercise - 03/30/20 0001      Knee/Hip Exercises: Stretches   Other Knee/Hip Stretches quad stretch thomas 3x45" R - then again 3 additional sets.       Knee/Hip Exercises: Aerobic   Stationary Bike level 2, 4 minutes      Knee/Hip Exercises: Standing   Step Down Right;4 sets;5 reps;Hand Hold: 1   step height 7", slow decent   Functional Squat 3 sets;5 reps   with box, cues to push floor away. - pain in knee     Knee/Hip Exercises: Seated   Knee/Hip Flexion self massage 5 minutes  Other Seated Knee/Hip Exercises semi single leg squats to height of 27.5" 4x5 B - slow descent     Sit to Sand without UE support;10 reps   cue for feet glued to ground 10 at 21", x10 at 20"     Manual Therapy   Manual Therapy Soft tissue mobilization    Manual therapy comments completed separate from all other activity     Soft tissue mobilization to right quad with knee and hip bent - 7 minutes                     PT Short Term Goals - 03/23/20 1112      PT SHORT TERM GOAL #1   Title Patient will report at least 50% improvement in overall symptoms and/or function to demonstrate improved functional mobility    Time 3    Period Weeks    Status Achieved    Target Date 03/15/20      PT SHORT TERM GOAL #2   Title Patient will be able to demonstrate 0-120 degrees if right knee ROM    Time 3    Period Weeks    Status On-going    Target Date  03/15/20      PT SHORT TERM GOAL #3   Title Patient will be independent in self management strategies to improve quality of life and functional outcomes.    Time 3    Period Weeks    Status Achieved    Target Date 03/15/20             PT Long Term Goals - 03/23/20 1113      PT LONG TERM GOAL #1   Title Patient will report at least 75% improvement in overall symptoms and/or function to demonstrate improved functional mobility    Time 6    Period Weeks    Status Achieved      PT LONG TERM GOAL #2   Title Patient will be able to squat and pick up back of laundry without pain or limitations to complete required work related tasks    Time 6    Period Weeks    Status Achieved      PT LONG TERM GOAL #3   Title Patient will be able to walk without limp or altered gait mechanics to improve ambulatory mobility    Time 6    Period Weeks    Status Achieved                 Plan - 03/30/20 0919    Clinical Impression Statement Focus on eccentric and right LE strengthening today. Tolerated well initially but with squat with box lift right knee started bothering her. Transitioned to [facility] stretch and soft tissue mobilization and with rolling stick and this was tolerated well with patient reporting no pain afterwards. Cues for form throughout session. Fatigue noted end of session but no increase in pain.    Personal Factors and Comorbidities Comorbidity 1;Fitness    Examination-Activity Limitations Squat;Stairs;Stand;Transfers;Locomotion Level;Lift    Examination-Participation Restrictions Community Activity;Cleaning    Stability/Clinical Decision Making Stable/Uncomplicated    Rehab Potential Good    PT Frequency 2x / week    PT Duration 6 weeks    PT Treatment/Interventions ADLs/Self Care Home Management;Aquatic Therapy;Cryotherapy;Electrical Stimulation;Moist Heat;Traction;Balance training;Therapeutic exercise;Therapeutic activities;Functional mobility training;Stair  training;Gait training;DME Instruction;Neuromuscular re-education;Patient/family education;Manual techniques;Dry needling;Passive range of motion;Joint Manipulations    PT Next Visit Plan focus on squat depth and body mechanics to improve  lifting.    PT Home Exercise Plan heel slides, (already doing - SAQs, LAQs, SLR), elevation and rocking back and forth on bike 02/24/20: supine hamstring and calf stretching with strap; 10/5 eccentric STS, heel walk with support; 10/14: heel prop           Patient will benefit from skilled therapeutic intervention in order to improve the following deficits and impairments:  Abnormal gait, Decreased endurance, Pain, Decreased strength, Decreased activity tolerance, Decreased mobility, Difficulty walking, Decreased range of motion  Visit Diagnosis: Other abnormalities of gait and mobility  Chronic pain of right knee  Stiffness of right knee, not elsewhere classified  Difficulty in walking, not elsewhere classified     Problem List Patient Active Problem List   Diagnosis Date Noted  . S/P arthroscopy of right knee 01/13/20  02/16/2020  . Derangement of posterior horn of medial meniscus of right knee   . Meniscus, lateral, derangement, right   . Osteoarthritis of knee 07/19/2018  . Pain in right knee 06/11/2018  . Postablative hypothyroidism 04/22/2018  . Hyperlipemia 01/13/2016  . VITAMIN D DEFICIENCY 04/01/2008    9:58 AM, 03/30/20 Jerene Pitch, DPT Physical Therapy with [facility]  [phone] office  [place] [address] [place], [place], [address] Phone: [phone]   Fax:  [phone]  Name: Jodi Wade MRN: [id number] Date of Birth: 12-Oct-1960

## 2020-04-07 ENCOUNTER — Encounter (HOSPITAL_COMMUNITY): Payer: Self-pay | Admitting: Physical Therapy

## 2020-04-23 ENCOUNTER — Other Ambulatory Visit: Payer: Self-pay

## 2020-04-23 ENCOUNTER — Other Ambulatory Visit (INDEPENDENT_AMBULATORY_CARE_PROVIDER_SITE_OTHER): Payer: No Typology Code available for payment source

## 2020-04-23 DIAGNOSIS — E890 Postprocedural hypothyroidism: Secondary | ICD-10-CM

## 2020-04-23 LAB — TSH: TSH: 1.65 u[IU]/mL (ref 0.35–4.50)

## 2020-04-23 LAB — T4, FREE: Free T4: 0.9 ng/dL (ref 0.60–1.60)

## 2020-04-27 ENCOUNTER — Telehealth (INDEPENDENT_AMBULATORY_CARE_PROVIDER_SITE_OTHER): Payer: No Typology Code available for payment source | Admitting: Endocrinology

## 2020-04-27 ENCOUNTER — Other Ambulatory Visit: Payer: Self-pay

## 2020-04-27 DIAGNOSIS — E890 Postprocedural hypothyroidism: Secondary | ICD-10-CM | POA: Diagnosis not present

## 2020-04-27 NOTE — Progress Notes (Signed)
Referring Physician: Kathyrn Lass  Reason for Appointment: Follow-up of thyroid  I connected with the above-named patient by video enabled telemedicine application and verified that I am speaking with the correct person. The patient was explained the limitations of evaluation and management by telemedicine and the availability of in person appointments.  Patient also understood that there may be a patient responsible charge related to this service . Location of the patient: Patient's home . Location of the provider: Physician office Only the patient and myself were participating in the encounter The patient understood the above statements and agreed to proceed.     History of Present Illness:     Review of her record indicates that she had a toxic nodular goiter in 2009 although she thinks her only symptom at that time was fatigue and she does not remember any other symptoms like palpitations or weight loss Her scan showed heterogenous uptake within the thyroid and a 24 uptake of 32% She was treated with 29 mCi After this treatment she thinks she felt better with her fatigue  At some point subsequently patient was started on levothyroxine and she does not know whether she was having any symptoms at that time Her record indicates that she has been in the past taking between 50 up to 100 g of levothyroxine until 2018 However subsequently her thyroxine doses had been reduced and then tapered off because of her TSH being persistently low Despite being off levothyroxine she had mild hyperthyroidism when first seen in 07/2017 although having no symptoms except weight gain She had mild T3 toxicosis  Thyroid scan showed heterogenous uptake, 24 uptake was 37%  She had radioactive iodine treatment with 30 mCi of I-131 on 09/14/2017  Follow-up thyroid levels in 7/19 showed that TSH had gone up to 63.8 She was started on thyroid supplementation Since her TSH was still high at 52 in  9/19 her dose was increased from 112 levothyroxine up to 200 mcg  With increasing the thyroid supplement using 200 mcg levothyroxine she did feel fairly good and no symptoms of hyperthyroidism were occurring again She had been out of her levothyroxine when she was seen in 04/2018 with marked increase in TSH She was also having complaints of fatigue and cold intolerance Previously on levothyroxine 200 mcg she felt better but her TSH was suppressed and free T4 significantly high at 2.55  Recent history: Subsequently had been requiring lower doses of levothyroxine TSH being suppressed She has been taking 88 mcg generic levothyroxine since 10/2018  She takes this regularly before breakfast on empty stomach, has not missed any doses She takes her vitamins at night  She feels fairly good with her energy level Weight appears to be about the same this year  TSH is consistently normal and stable, now 1.65 compared to 1.2 Free T4 normal  Patient's weight history is as follows:  Wt Readings from Last 3 Encounters:  01/09/20 210 lb (95.3 kg)  11/19/19 210 lb (95.3 kg)  08/25/19 212 lb (96.2 kg)    Thyroid function results have been as follows:  Lab Results  Component Value Date   TSH 1.65 04/23/2020   TSH 1.16 04/25/2019   TSH 0.964 12/09/2018   TSH 0.03 (L) 10/17/2018   FREET4 0.90 04/23/2020   FREET4 1.15 04/25/2019   FREET4 1.26 10/17/2018   FREET4 1.75 (H) 08/16/2018   T3FREE 2.6 12/10/2017   T3FREE 3.9 10/15/2017   T3FREE 4.4 (H) 08/01/2017   History of  multinodular GOITER  She had a needle biopsy of a dominant right inferior nodule in 2017 which showed benign follicular adenoma Her last clinical exam was in 08/2018  Past Medical History:  Diagnosis Date  . Allergy   . Diabetes mellitus without complication ([place])   . Hypertension   . Thyroid disease     Past Surgical History:  Procedure Laterality Date  . BREAST BIOPSY Right   . KNEE ARTHROSCOPY WITH MEDIAL  MENISECTOMY Right 01/13/2020   Procedure: KNEE ARTHROSCOPY WITH MEDIAL AND LATERAL  MENISCECTOMY;  Surgeon: Carole Civil, MD;  Location: [facility];  Service: Orthopedics;  Laterality: Right;  . PARTIAL HYSTERECTOMY     abdominal    Family History  Problem Relation Age of Onset  . Cancer Mother   . Breast cancer Mother   . Thyroid disease Mother   . Diabetes Other   . Hypertension Other   . Thyroid disease Maternal Aunt   . Thyroid disease Son        Hyperthyroid, not treated    Social History:  reports that she has never smoked. She has never used smokeless tobacco. She reports that she does not drink alcohol and does not use drugs.  Allergies:  Allergies  Allergen Reactions  . Latex Hives  . Fexofenadine-Pseudoephed Er     REACTION: fast heart rate  . Hydrochlorothiazide     REACTION: Hypokalemia  . Pseudoephedrine     REACTION: Heart Rate    Allergies as of 04/27/2020      Reactions   Latex Hives   Fexofenadine-pseudoephed Er    REACTION: fast heart rate   Hydrochlorothiazide    REACTION: Hypokalemia   Pseudoephedrine    REACTION: Heart Rate      Medication List       Accurate as of April 27, 2020  3:34 PM. If you have any questions, ask your nurse or doctor.        amLODipine 10 MG tablet Commonly known as: NORVASC Take 10 mg by mouth daily.   atorvastatin 20 MG tablet Commonly known as: LIPITOR Take 20 mg by mouth daily.   HYDROcodone-acetaminophen 5-325 MG tablet Commonly known as: NORCO/VICODIN Take 1 tablet by mouth every 6 (six) hours as needed for moderate pain.   ibuprofen 800 MG tablet Commonly known as: ADVIL Take 1 tablet (800 mg total) by mouth every 8 (eight) hours as needed for moderate pain.   levothyroxine 88 MCG tablet Commonly known as: SYNTHROID Take 1 tablet (88 mcg total) by mouth daily.   lisinopril 20 MG tablet Commonly known as: ZESTRIL Take 20 mg by mouth daily.   promethazine 12.5 MG tablet Commonly known as:  PHENERGAN Take 1 tablet (12.5 mg total) by mouth every 6 (six) hours as needed for nausea or vomiting.          Review of Systems  She is followed by her PCP for hypertension  BP Readings from Last 3 Encounters:  02/18/20 (!) 162/108  01/13/20 116/64  01/09/20 (!) 151/90            Examination:    There were no vitals taken for this visit.  Exam not done, patient seen on remote visit  Assessment:  History of TOXIC nodular goiter dating back to 2009 treated with I-131 twice, last in 09/2017  Post ablative HYPOTHYROIDISM:  She had significant posttreatment hypothyroidism with TSH as high as 92 after her treatment  Although previously had required as much as 200 mcg of  levothyroxine her dose subsequently has been significantly less She is taking a stable dose of 88 mcg since 10/2018 with consistently normal TSH levels  She feels fairly good subjectively and is taking her levothyroxine very consistently as directed  GOITER: This had improved in size after the radioactive iron treatment and has not had a follow-up exam Previously had a right dominant lower lobe nodule that was benign on biopsy  PLAN:   She will continue 88 mcg of levothyroxine daily in the morning as before  Follow-up in 1 year needs to be seen in the office for exam  Elayne Snare 04/27/2020, 3:34 PM      Note: This office note was prepared with Dragon voice recognition system technology. Any transcriptional errors that result from this process are unintentional.

## 2020-06-17 ENCOUNTER — Other Ambulatory Visit: Payer: Self-pay | Admitting: Endocrinology

## 2020-06-17 MED FILL — LISINOPRIL 20 MG TABLET: 20 | 90 days supply | Qty: 90 | Fill #3

## 2020-06-17 MED FILL — IBUPROFEN 800 MG TABS: 800 | 30 days supply | Qty: 90 | Fill #1

## 2020-06-17 MED FILL — AMLODIPINE BESYLATE 10 MG T: 10 | 90 days supply | Qty: 90 | Fill #3

## 2020-06-17 MED FILL — LEVOTHYROXINE 88 MCG TABLET: 88 | 90 days supply | Qty: 90 | Fill #0

## 2020-06-17 MED FILL — ATORVASTATIN CALCIUM 20 MG: 20 | 90 days supply | Qty: 90 | Fill #2

## 2020-09-04 ENCOUNTER — Other Ambulatory Visit (HOSPITAL_COMMUNITY): Payer: Self-pay

## 2020-09-14 ENCOUNTER — Other Ambulatory Visit (HOSPITAL_COMMUNITY): Payer: Self-pay

## 2020-09-20 ENCOUNTER — Other Ambulatory Visit (HOSPITAL_COMMUNITY): Payer: Self-pay

## 2020-09-20 MED FILL — Levothyroxine Sodium Tab 88 MCG: ORAL | 90 days supply | Qty: 90 | Fill #0 | Status: AC

## 2020-09-24 ENCOUNTER — Other Ambulatory Visit (HOSPITAL_COMMUNITY): Payer: Self-pay

## 2020-09-27 ENCOUNTER — Other Ambulatory Visit (HOSPITAL_COMMUNITY): Payer: Self-pay

## 2020-09-28 ENCOUNTER — Other Ambulatory Visit (HOSPITAL_COMMUNITY): Payer: Self-pay

## 2020-09-28 ENCOUNTER — Other Ambulatory Visit: Payer: Self-pay | Admitting: Endocrinology

## 2020-09-28 MED ORDER — AMLODIPINE BESYLATE 10 MG PO TABS
10.0000 mg | ORAL_TABLET | Freq: Every day | ORAL | 0 refills | Status: DC
  Filled 2020-09-28: qty 90, 90d supply, fill #0

## 2020-09-28 MED ORDER — LISINOPRIL 20 MG PO TABS
20.0000 mg | ORAL_TABLET | Freq: Every day | ORAL | 0 refills | Status: DC
  Filled 2020-09-28: qty 90, 90d supply, fill #0

## 2020-09-29 ENCOUNTER — Other Ambulatory Visit (HOSPITAL_COMMUNITY): Payer: Self-pay

## 2020-09-29 MED ORDER — AMLODIPINE BESYLATE 10 MG PO TABS
1.0000 | ORAL_TABLET | Freq: Every day | ORAL | 3 refills | Status: DC
  Filled 2020-09-29 – 2021-03-29 (×2): qty 90, 90d supply, fill #0
  Filled 2021-06-27: qty 90, 90d supply, fill #1
  Filled 2021-09-08: qty 90, 90d supply, fill #2

## 2020-09-29 MED ORDER — LISINOPRIL 20 MG PO TABS
1.0000 | ORAL_TABLET | Freq: Every day | ORAL | 3 refills | Status: DC
  Filled 2020-09-29 – 2020-12-28 (×2): qty 90, 90d supply, fill #0
  Filled 2021-03-29: qty 90, 90d supply, fill #1

## 2020-11-03 ENCOUNTER — Other Ambulatory Visit: Payer: Self-pay | Admitting: Family Medicine

## 2020-11-03 DIAGNOSIS — Z1231 Encounter for screening mammogram for malignant neoplasm of breast: Secondary | ICD-10-CM

## 2020-11-22 ENCOUNTER — Other Ambulatory Visit (HOSPITAL_COMMUNITY): Payer: Self-pay

## 2020-11-23 ENCOUNTER — Other Ambulatory Visit (HOSPITAL_COMMUNITY): Payer: Self-pay

## 2020-11-23 MED ORDER — AMLODIPINE BESYLATE 10 MG PO TABS
10.0000 mg | ORAL_TABLET | Freq: Every day | ORAL | 0 refills | Status: DC
  Filled 2020-11-23 – 2020-12-27 (×2): qty 90, 90d supply, fill #0

## 2020-11-24 ENCOUNTER — Other Ambulatory Visit (HOSPITAL_COMMUNITY): Payer: Self-pay

## 2020-11-30 ENCOUNTER — Other Ambulatory Visit (HOSPITAL_COMMUNITY): Payer: Self-pay

## 2020-12-01 ENCOUNTER — Other Ambulatory Visit (HOSPITAL_COMMUNITY): Payer: Self-pay

## 2020-12-01 MED ORDER — ATORVASTATIN CALCIUM 20 MG PO TABS
20.0000 mg | ORAL_TABLET | Freq: Every day | ORAL | 2 refills | Status: DC
  Filled 2020-12-01: qty 90, 90d supply, fill #0
  Filled 2021-03-29: qty 90, 90d supply, fill #1

## 2020-12-03 ENCOUNTER — Other Ambulatory Visit (HOSPITAL_COMMUNITY): Payer: Self-pay

## 2020-12-21 ENCOUNTER — Other Ambulatory Visit (HOSPITAL_COMMUNITY): Payer: Self-pay

## 2020-12-27 ENCOUNTER — Other Ambulatory Visit: Payer: Self-pay | Admitting: Endocrinology

## 2020-12-27 ENCOUNTER — Other Ambulatory Visit (HOSPITAL_COMMUNITY): Payer: Self-pay

## 2020-12-27 MED ORDER — LEVOTHYROXINE SODIUM 88 MCG PO TABS
88.0000 ug | ORAL_TABLET | Freq: Every day | ORAL | 1 refills | Status: DC
  Filled 2020-12-27: qty 90, 90d supply, fill #0
  Filled 2021-03-29: qty 90, 90d supply, fill #1

## 2020-12-28 ENCOUNTER — Other Ambulatory Visit (HOSPITAL_COMMUNITY): Payer: Self-pay

## 2020-12-31 ENCOUNTER — Other Ambulatory Visit: Payer: Self-pay

## 2020-12-31 ENCOUNTER — Ambulatory Visit
Admission: RE | Admit: 2020-12-31 | Discharge: 2020-12-31 | Disposition: A | Discharge status: Home / Self Care | Payer: No Typology Code available for payment source | Source: Ambulatory Visit | Attending: Family Medicine | Admitting: Family Medicine

## 2020-12-31 DIAGNOSIS — Z1231 Encounter for screening mammogram for malignant neoplasm of breast: Secondary | ICD-10-CM

## 2021-03-29 ENCOUNTER — Other Ambulatory Visit (HOSPITAL_COMMUNITY): Payer: Self-pay

## 2021-05-27 ENCOUNTER — Other Ambulatory Visit (HOSPITAL_COMMUNITY): Payer: Self-pay

## 2021-05-27 MED ORDER — LISINOPRIL 40 MG PO TABS
40.0000 mg | ORAL_TABLET | Freq: Every day | ORAL | 3 refills | Status: DC
  Filled 2021-05-27: qty 90, 90d supply, fill #0
  Filled 2021-09-08: qty 90, 90d supply, fill #1
  Filled 2021-12-14: qty 90, 90d supply, fill #2
  Filled 2022-03-07: qty 90, 90d supply, fill #3

## 2021-05-31 ENCOUNTER — Other Ambulatory Visit (HOSPITAL_COMMUNITY): Payer: Self-pay

## 2021-05-31 MED ORDER — ATORVASTATIN CALCIUM 40 MG PO TABS
40.0000 mg | ORAL_TABLET | Freq: Every day | ORAL | 1 refills | Status: DC
  Filled 2021-05-31: qty 90, 90d supply, fill #0
  Filled 2021-11-03: qty 90, 90d supply, fill #1

## 2021-06-27 ENCOUNTER — Other Ambulatory Visit (HOSPITAL_COMMUNITY): Payer: Self-pay

## 2021-06-27 ENCOUNTER — Other Ambulatory Visit: Payer: Self-pay | Admitting: Endocrinology

## 2021-06-27 ENCOUNTER — Telehealth: Payer: Self-pay

## 2021-06-27 ENCOUNTER — Telehealth: Payer: Self-pay | Admitting: Endocrinology

## 2021-06-27 DIAGNOSIS — E890 Postprocedural hypothyroidism: Secondary | ICD-10-CM

## 2021-06-27 MED ORDER — LEVOTHYROXINE SODIUM 88 MCG PO TABS
88.0000 ug | ORAL_TABLET | Freq: Every day | ORAL | 1 refills | Status: DC
  Filled 2021-06-27: qty 90, 90d supply, fill #0
  Filled 2021-07-18 – 2021-09-08 (×2): qty 90, 90d supply, fill #1

## 2021-06-27 NOTE — Telephone Encounter (Signed)
Pt is calling in stating that she would like to have her labs drawn in [place] in [place], [place] [address] [phone] (F).

## 2021-06-27 NOTE — Telephone Encounter (Signed)
Patient scheduled for labs on 1/25. She has not been seen since 2021. Please put labs in for them to be drawn.

## 2021-06-28 ENCOUNTER — Other Ambulatory Visit: Payer: Self-pay

## 2021-06-28 ENCOUNTER — Other Ambulatory Visit: Payer: Self-pay | Admitting: Endocrinology

## 2021-06-28 DIAGNOSIS — E890 Postprocedural hypothyroidism: Secondary | ICD-10-CM

## 2021-06-28 NOTE — Addendum Note (Signed)
Addended by: Elayne Snare on: 06/28/2021 08:09 AM   Modules accepted: Orders

## 2021-06-28 NOTE — Telephone Encounter (Signed)
Kieth Brightly released labs and contacted patient

## 2021-06-29 ENCOUNTER — Other Ambulatory Visit: Payer: No Typology Code available for payment source

## 2021-06-29 LAB — T4, FREE: Free T4: 1.2 ng/dL (ref 0.82–1.77)

## 2021-06-29 LAB — TSH: TSH: 4.05 u[IU]/mL (ref 0.450–4.500)

## 2021-06-30 ENCOUNTER — Other Ambulatory Visit: Payer: Self-pay

## 2021-06-30 ENCOUNTER — Ambulatory Visit (INDEPENDENT_AMBULATORY_CARE_PROVIDER_SITE_OTHER): Payer: No Typology Code available for payment source | Admitting: Endocrinology

## 2021-06-30 ENCOUNTER — Encounter: Payer: Self-pay | Admitting: Endocrinology

## 2021-06-30 VITALS — BP 150/96 | HR 81 | Ht 63.0 in | Wt 215.4 lb

## 2021-06-30 DIAGNOSIS — E890 Postprocedural hypothyroidism: Secondary | ICD-10-CM

## 2021-06-30 NOTE — Progress Notes (Signed)
Referring Physician: Kathyrn Lass  Reason for Appointment: Follow-up of thyroid    History of Present Illness:     Review of her record indicates that she had a toxic nodular goiter in 2009 although she thinks her only symptom at that time was fatigue and she does not remember any other symptoms like palpitations or weight loss Her scan showed heterogenous uptake within the thyroid and a 24 uptake of 32% She was treated with 29 mCi After this treatment she thinks she felt better with her fatigue  At some point subsequently patient was started on levothyroxine and she does not know whether she was having any symptoms at that time Her record indicates that she has been in the past taking between 50 up to 100 g of levothyroxine until 2018 However subsequently her thyroxine doses had been reduced and then tapered off because of her TSH being persistently low Despite being off levothyroxine she had mild hyperthyroidism when first seen in 07/2017 although having no symptoms except weight gain She had mild T3 toxicosis  Thyroid scan showed heterogenous uptake, 24 uptake was 37%  She had radioactive iodine treatment with 30 mCi of I-131 on 09/14/2017  Follow-up thyroid levels in 7/19 showed that TSH had gone up to 63.8 She was started on thyroid supplementation Since her TSH was still high at 52 in 9/19 her dose was increased from 112 levothyroxine up to 200 mcg  With increasing the thyroid supplement using 200 mcg levothyroxine she did feel fairly good and no symptoms of hyperthyroidism were occurring again She had been out of her levothyroxine when she was seen in 04/2018 with marked increase in TSH She was also having complaints of fatigue and cold intolerance Previously on levothyroxine 200 mcg she felt better but her TSH was suppressed and free T4 significantly high at 2.55  Recent history: Subsequently had been requiring lower doses of levothyroxine TSH being  suppressed She has been taking 88 mcg generic levothyroxine since 10/2018  Her last visit was in 04/2020 and was last seen in the office in person in 08/2018  She takes this before breakfast on empty stomach She has been regular with her supplement recently She takes her vitamins at lunchtime  She has gained some weight since her last visit However she has not been exercising She said that 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 otherwise feels fairly good  TSH is upper normal at 4.05, previously consistently below 2  Free T4 normal  Patient's weight history is as follows:  Wt Readings from Last 3 Encounters:  06/30/21 215 lb 6.4 oz (97.7 kg)  01/09/20 210 lb (95.3 kg)  11/19/19 210 lb (95.3 kg)    Thyroid function results have been as follows:  Lab Results  Component Value Date   TSH 4.050 06/28/2021   TSH 1.65 04/23/2020   TSH 1.16 04/25/2019   TSH 0.964 12/09/2018   FREET4 1.20 06/28/2021   FREET4 0.90 04/23/2020   FREET4 1.15 04/25/2019   FREET4 1.26 10/17/2018   T3FREE 2.6 12/10/2017   T3FREE 3.9 10/15/2017   T3FREE 4.4 (H) 08/01/2017   History of multinodular GOITER  She had a needle biopsy of a dominant right inferior nodule in 2017 which showed benign follicular adenoma Her last clinical exam was in 08/2018  Past Medical History:  Diagnosis Date   Allergy    Diabetes mellitus without complication ([place])    Hypertension  Thyroid disease     Past Surgical History:  Procedure Laterality Date   BREAST BIOPSY Right    KNEE ARTHROSCOPY WITH MEDIAL MENISECTOMY Right 01/13/2020   Procedure: KNEE ARTHROSCOPY WITH MEDIAL AND LATERAL  MENISCECTOMY;  Surgeon: Carole Civil, MD;  Location: [facility];  Service: Orthopedics;  Laterality: Right;   PARTIAL HYSTERECTOMY     abdominal    Family History  Problem Relation Age of Onset   Cancer Mother    Breast cancer Mother    Thyroid disease Mother    Diabetes Other    Hypertension Other     Thyroid disease Maternal Aunt    Thyroid disease Son        Hyperthyroid, not treated    Social History:  reports that she has never smoked. She has never used smokeless tobacco. She reports that she does not drink alcohol and does not use drugs.  Allergies:  Allergies  Allergen Reactions   Latex Hives   Fexofenadine-Pseudoephed Er     REACTION: fast heart rate   Hydrochlorothiazide     REACTION: Hypokalemia   Pseudoephedrine     REACTION: Heart Rate    Allergies as of 06/30/2021       Reactions   Latex Hives   Fexofenadine-pseudoephed Er    REACTION: fast heart rate   Hydrochlorothiazide    REACTION: Hypokalemia   Pseudoephedrine    REACTION: Heart Rate        Medication List        Accurate as of June 30, 2021  1:12 PM. If you have any questions, ask your nurse or doctor.          amLODipine 10 MG tablet Commonly known as: NORVASC TAKE 1 TABLET BY MOUTH ONCE DAILY   amLODipine 10 MG tablet Commonly known as: NORVASC Take 1 tablet (10 mg total) by mouth daily.   amLODipine 10 MG tablet Commonly known as: NORVASC Take 1 tablet (10 mg total) by mouth daily.   amLODipine 10 MG tablet Commonly known as: NORVASC Take 10 mg by mouth daily.   atorvastatin 20 MG tablet Commonly known as: LIPITOR Take 20 mg by mouth daily.   atorvastatin 20 MG tablet Commonly known as: LIPITOR TAKE 1 TABLET BY MOUTH ONCE DAILY   atorvastatin 40 MG tablet Commonly known as: LIPITOR Take 1 tablet (40 mg total) by mouth daily.   HYDROcodone-acetaminophen 5-325 MG tablet Commonly known as: NORCO/VICODIN Take 1 tablet by mouth every 6 (six) hours as needed for moderate pain.   ibuprofen 800 MG tablet Commonly known as: ADVIL Take 1 tablet (800 mg total) by mouth every 8 (eight) hours as needed for moderate pain.   levothyroxine 88 MCG tablet Commonly known as: SYNTHROID Take 1 tablet (88 mcg total) by mouth daily.   lisinopril 20 MG tablet Commonly known as:  ZESTRIL TAKE 1 TABLET BY MOUTH ONCE DAILY   lisinopril 20 MG tablet Commonly known as: ZESTRIL Take 1 tablet (20 mg total) by mouth daily.   lisinopril 40 MG tablet Commonly known as: ZESTRIL Take 1 tablet (40 mg total) by mouth daily.   lisinopril 20 MG tablet Commonly known as: ZESTRIL Take 20 mg by mouth daily.   promethazine 12.5 MG tablet Commonly known as: PHENERGAN Take 1 tablet (12.5 mg total) by mouth every 6 (six) hours as needed for nausea or vomiting.           Review of Systems  She is followed by her PCP for  hypertension and is recently getting some medication adjustments Does have a monitor at home  BP Readings from Last 3 Encounters:  06/30/21 (!) 150/96  02/18/20 (!) 162/108  01/13/20 116/64            Examination:    BP (!) 150/96    Pulse 81    Ht 5\' 3"  (1.6 m)    Wt 215 lb 6.4 oz (97.7 kg)    SpO2 98%    BMI 38.16 kg/m   Right thyroid lobe is smooth and fleshy, about 2-2.5 times normal, no separate nodules felt No enlargement on the left side   Assessment:  History of TOXIC nodular goiter dating back to 2009 treated with I-131 twice, last in 09/2017  Post ablative HYPOTHYROIDISM:  She had significant posttreatment hypothyroidism with TSH as high as 92 after her treatment  Although previously had required as much as 200 mcg of levothyroxine her dose subsequently has been significantly less She is taking a stable dose of 88 mcg since 10/2018  She only gets fatigue when she is having a busy schedule Has been regular with her supplement  GOITER: She still appears to have a right-sided enlargement which is relatively smooth and roughly about the same size as before  PLAN:   She will continue 88 mcg of levothyroxine daily with additional half tablet on Sundays  Follow-up in June  Elayne Snare 06/30/2021, 1:12 PM      Note: This office note was prepared with Dragon voice recognition system technology. Any transcriptional errors that  result from this process are unintentional.

## 2021-06-30 NOTE — Patient Instructions (Signed)
1 1/2 thyroid pills on Sundays

## 2021-07-18 ENCOUNTER — Other Ambulatory Visit (HOSPITAL_COMMUNITY): Payer: Self-pay

## 2021-09-08 ENCOUNTER — Other Ambulatory Visit (HOSPITAL_COMMUNITY): Payer: Self-pay

## 2021-10-12 ENCOUNTER — Other Ambulatory Visit (HOSPITAL_COMMUNITY): Payer: Self-pay

## 2021-10-12 MED ORDER — IBUPROFEN 400 MG PO TABS
400.0000 mg | ORAL_TABLET | Freq: Three times a day (TID) | ORAL | 0 refills | Status: DC
  Filled 2021-10-12: qty 16, 2d supply, fill #0

## 2021-10-30 IMAGING — MG MM DIGITAL SCREENING BILAT W/ TOMO AND CAD
8 series · 8 of 24 positions shown · non-contrast
Comparison: Previous exam(s).

CLINICAL DATA: Screening.

EXAM:
DIGITAL SCREENING BILATERAL MAMMOGRAM WITH TOMOSYNTHESIS AND CAD
TECHNIQUE: Bilateral screening digital craniocaudal and mediolateral oblique
mammograms were obtained. Bilateral screening digital breast
tomosynthesis was performed. The images were evaluated with
computer-aided detection.

[R CC synth-2D]
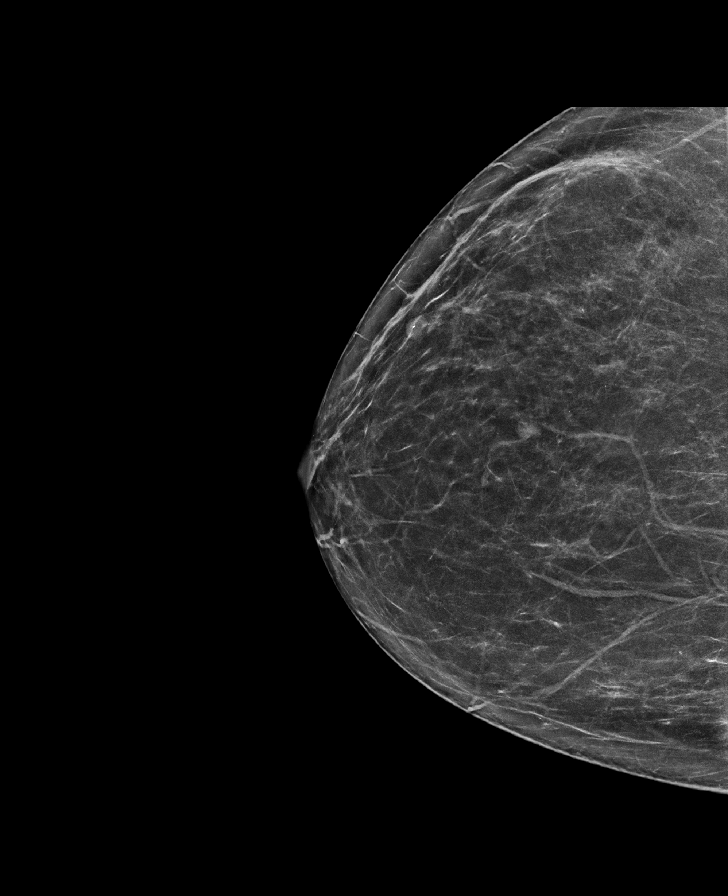

[L CC synth-2D]
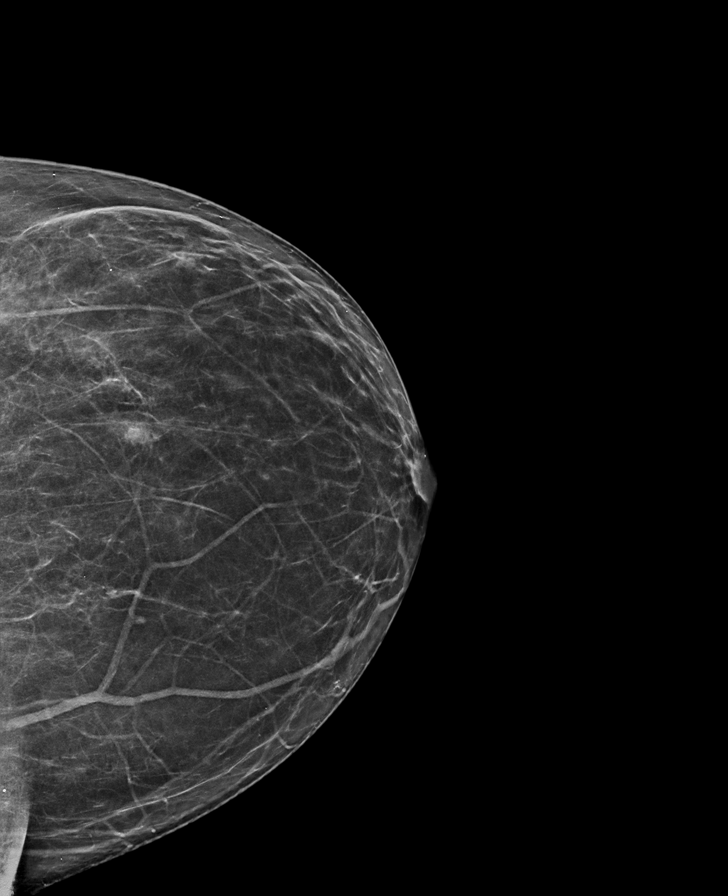

[L MLO synth-2D]
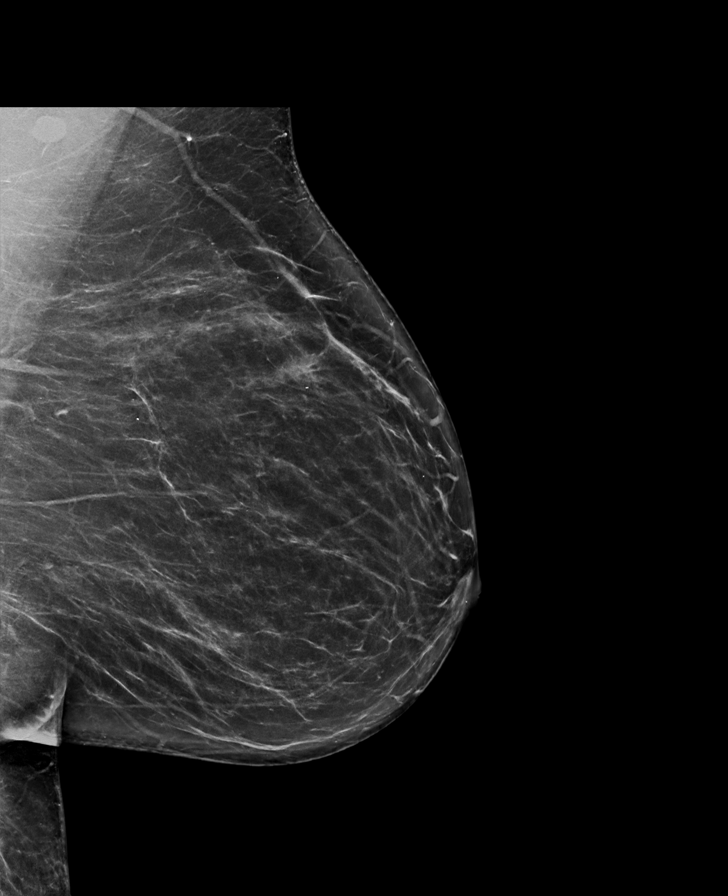

[R MLO synth-2D]
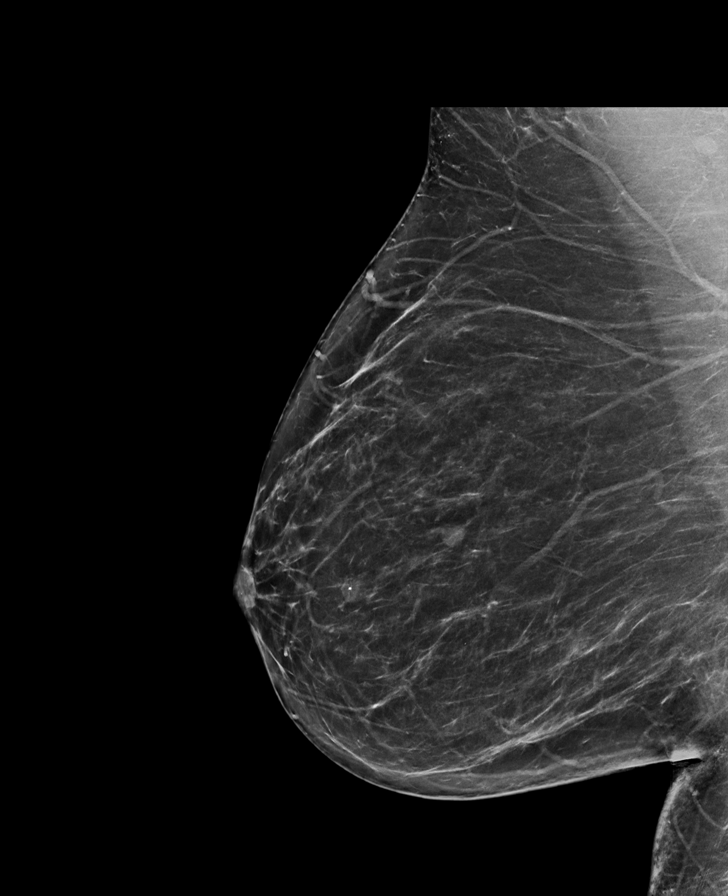

[R CC tomo · tomo slice 36/71.0]
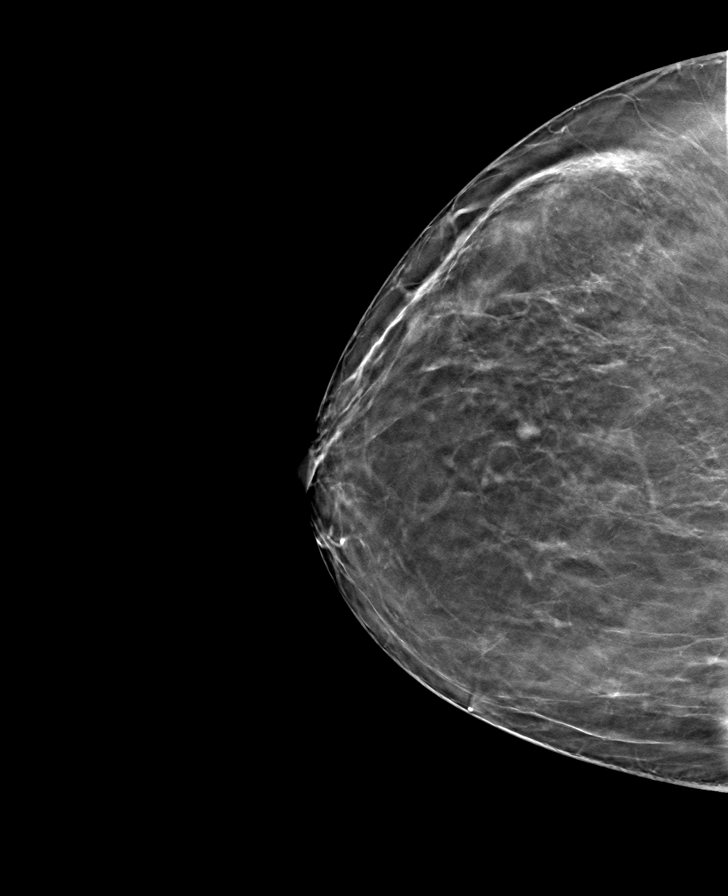

[L MLO tomo · tomo slice 42/83.0]
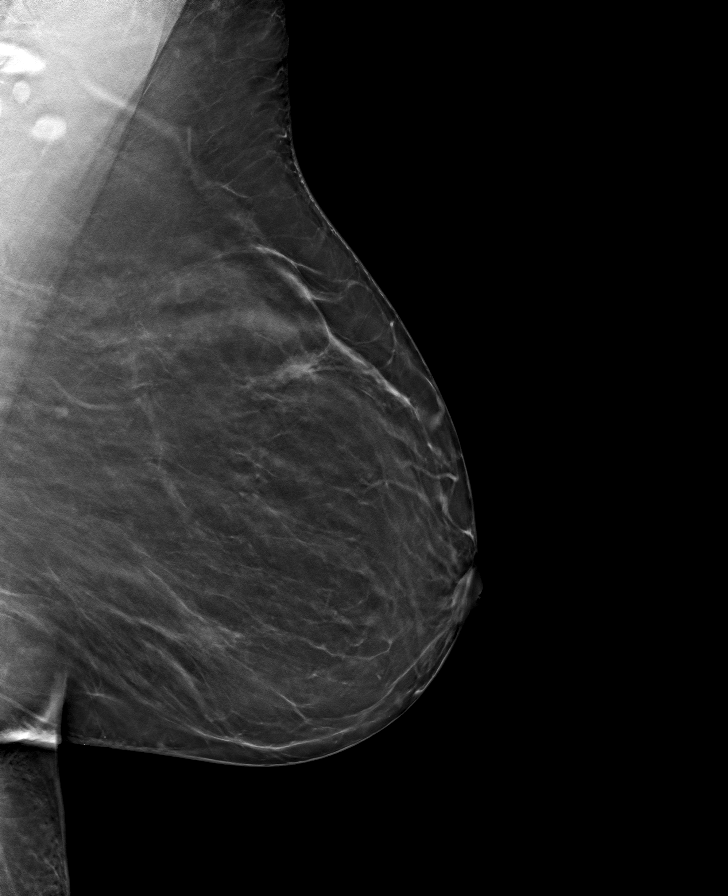

[R MLO tomo · tomo slice 41/82.0]
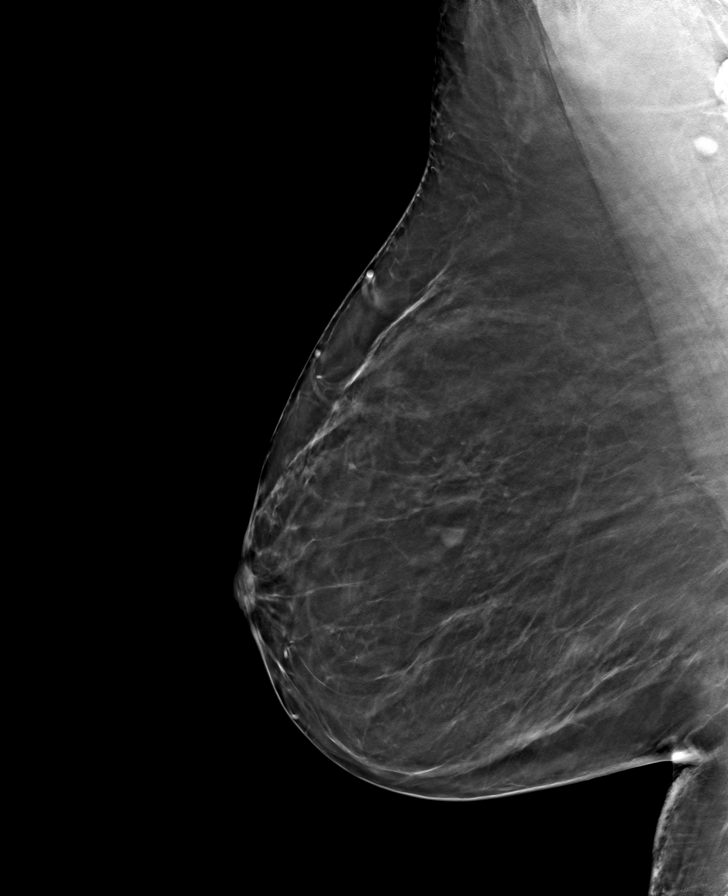

[L CC tomo · tomo slice 35/68.0]
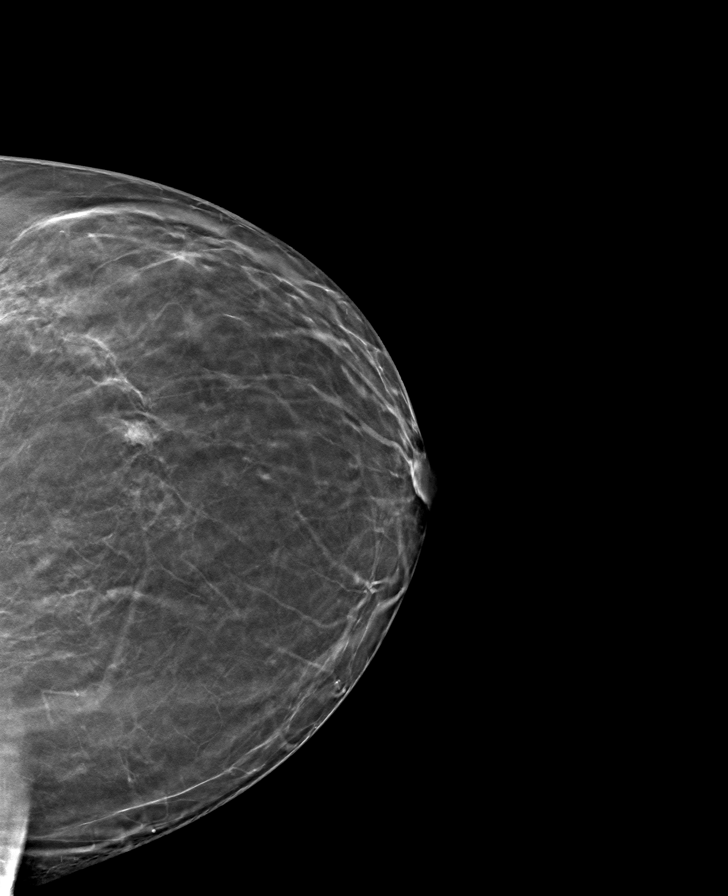

[8 of 24 positions shown; findings below may reference images not displayed]

ACR Breast Density Category b: There are scattered areas of
fibroglandular density.
FINDINGS: There are no findings suspicious for malignancy.
IMPRESSION: No mammographic evidence of malignancy. A result letter of this
screening mammogram will be mailed directly to the patient.

RECOMMENDATION:
Screening mammogram in one year. (Code:51-O-LD2)

BI-RADS CATEGORY  1: Negative.

## 2021-11-03 ENCOUNTER — Other Ambulatory Visit (HOSPITAL_COMMUNITY): Payer: Self-pay

## 2021-11-28 ENCOUNTER — Other Ambulatory Visit: Payer: Self-pay | Admitting: Family Medicine

## 2021-11-28 DIAGNOSIS — Z1231 Encounter for screening mammogram for malignant neoplasm of breast: Secondary | ICD-10-CM

## 2021-12-01 ENCOUNTER — Ambulatory Visit: Payer: No Typology Code available for payment source | Admitting: Endocrinology

## 2021-12-14 ENCOUNTER — Other Ambulatory Visit (HOSPITAL_COMMUNITY): Payer: Self-pay

## 2021-12-14 ENCOUNTER — Other Ambulatory Visit: Payer: Self-pay | Admitting: Endocrinology

## 2021-12-14 MED ORDER — AMLODIPINE BESYLATE 10 MG PO TABS
10.0000 mg | ORAL_TABLET | Freq: Every day | ORAL | 3 refills | Status: DC
  Filled 2021-12-14: qty 90, 90d supply, fill #0
  Filled 2022-03-07: qty 90, 90d supply, fill #1
  Filled 2022-05-30: qty 90, 90d supply, fill #2
  Filled 2022-08-29 – 2022-09-20 (×2): qty 30, 30d supply, fill #0
  Filled 2022-11-13: qty 30, 30d supply, fill #1
  Filled 2022-11-29: qty 30, 30d supply, fill #2

## 2021-12-14 MED ORDER — LEVOTHYROXINE SODIUM 88 MCG PO TABS
88.0000 ug | ORAL_TABLET | Freq: Every day | ORAL | 1 refills | Status: DC
  Filled 2021-12-14: qty 90, 90d supply, fill #0
  Filled 2022-03-07: qty 90, 90d supply, fill #1

## 2022-01-02 ENCOUNTER — Ambulatory Visit
Admission: RE | Admit: 2022-01-02 | Discharge: 2022-01-02 | Disposition: A | Discharge status: Home / Self Care | Payer: No Typology Code available for payment source | Source: Ambulatory Visit | Attending: Family Medicine | Admitting: Family Medicine

## 2022-01-02 DIAGNOSIS — Z1231 Encounter for screening mammogram for malignant neoplasm of breast: Secondary | ICD-10-CM

## 2022-02-03 ENCOUNTER — Other Ambulatory Visit (INDEPENDENT_AMBULATORY_CARE_PROVIDER_SITE_OTHER): Payer: No Typology Code available for payment source

## 2022-02-03 DIAGNOSIS — E890 Postprocedural hypothyroidism: Secondary | ICD-10-CM | POA: Diagnosis not present

## 2022-02-03 LAB — TSH: TSH: 3.1 u[IU]/mL (ref 0.35–5.50)

## 2022-02-08 ENCOUNTER — Encounter: Payer: Self-pay | Admitting: Endocrinology

## 2022-02-08 ENCOUNTER — Ambulatory Visit (INDEPENDENT_AMBULATORY_CARE_PROVIDER_SITE_OTHER): Payer: No Typology Code available for payment source | Admitting: Endocrinology

## 2022-02-08 VITALS — BP 138/84 | HR 78 | Ht 64.0 in | Wt 213.2 lb

## 2022-02-08 DIAGNOSIS — E890 Postprocedural hypothyroidism: Secondary | ICD-10-CM | POA: Diagnosis not present

## 2022-02-08 NOTE — Progress Notes (Signed)
Referring Physician: Kathyrn Lass  Reason for Appointment: Follow-up of thyroid    History of Present Illness:     Review of her record indicates that she had a toxic nodular goiter in 2009 although she thinks her only symptom at that time was fatigue and she does not remember any other symptoms like palpitations or weight loss Her scan showed heterogenous uptake within the thyroid and a 24 uptake of 32% She was treated with 29 mCi After this treatment she thinks she felt better with her fatigue  At some point subsequently patient was started on levothyroxine and she does not know whether she was having any symptoms at that time Her record indicates that she has been in the past taking between 50 up to 100 g of levothyroxine until 2018 However subsequently her thyroxine doses had been reduced and then tapered off because of her TSH being persistently low Despite being off levothyroxine she had mild hyperthyroidism when first seen in 07/2017 although having no symptoms except weight gain She had mild T3 toxicosis  Thyroid scan showed heterogenous uptake, 24 uptake was 37%  She had radioactive iodine treatment with 30 mCi of I-131 on 09/14/2017  Follow-up thyroid levels in 7/19 showed that TSH had gone up to 63.8 She was started on thyroid supplementation Since her TSH was still high at 52 in 9/19 her dose was increased from 112 levothyroxine up to 200 mcg  With increasing the thyroid supplement using 200 mcg levothyroxine she did feel fairly good and no symptoms of hyperthyroidism were occurring again She had been out of her levothyroxine when she was seen in 04/2018 with marked increase in TSH She was also having complaints of fatigue and cold intolerance Previously on levothyroxine 200 mcg she felt better but her TSH was suppressed and free T4 significantly high at 2.55  Recent history: Subsequently had been requiring lower doses of levothyroxine TSH being  suppressed She has been taking 88 mcg generic levothyroxine since 10/2018  She takes this before breakfast on empty stomach She has been regular with her supplement as before She takes her vitamins at lunchtime  She said that 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 otherwise feels fairly good She was told to take an extra half tablet weekly on her last visit but she forgot and is taking 1 pill every day  TSH which was upper normal at 4.05, previously is now 3.1   Free T4 normal  Patient's weight history is as follows:  Wt Readings from Last 3 Encounters:  02/08/22 213 lb 3.2 oz (96.7 kg)  06/30/21 215 lb 6.4 oz (97.7 kg)  01/09/20 210 lb (95.3 kg)    Thyroid function results have been as follows:  Lab Results  Component Value Date   TSH 3.10 02/03/2022   TSH 4.050 06/28/2021   TSH 1.65 04/23/2020   TSH 1.16 04/25/2019   FREET4 1.20 06/28/2021   FREET4 0.90 04/23/2020   FREET4 1.15 04/25/2019   FREET4 1.26 10/17/2018   T3FREE 2.6 12/10/2017   T3FREE 3.9 10/15/2017   T3FREE 4.4 (H) 08/01/2017   History of multinodular GOITER  She had a needle biopsy of a dominant right inferior nodule in 2017 which showed benign follicular adenoma Her last clinical exam was in 08/2018  Past Medical History:  Diagnosis Date   Allergy    Diabetes mellitus without complication ([place])    Hypertension    Thyroid disease  Past Surgical History:  Procedure Laterality Date   BREAST BIOPSY Right    KNEE ARTHROSCOPY WITH MEDIAL MENISECTOMY Right 01/13/2020   Procedure: KNEE ARTHROSCOPY WITH MEDIAL AND LATERAL  MENISCECTOMY;  Surgeon: Carole Civil, MD;  Location: [facility];  Service: Orthopedics;  Laterality: Right;   PARTIAL HYSTERECTOMY     abdominal    Family History  Problem Relation Age of Onset   Cancer Mother    Breast cancer Mother    Thyroid disease Mother    Diabetes Other    Hypertension Other    Thyroid disease Maternal Aunt    Thyroid disease Son         Hyperthyroid, not treated    Social History:  reports that she has never smoked. She has never used smokeless tobacco. She reports that she does not drink alcohol and does not use drugs.  Allergies:  Allergies  Allergen Reactions   Latex Hives   Fexofenadine-Pseudoephed Er     REACTION: fast heart rate   Hydrochlorothiazide     REACTION: Hypokalemia   Pseudoephedrine     REACTION: Heart Rate    Allergies as of 02/08/2022       Reactions   Latex Hives   Fexofenadine-pseudoephed Er    REACTION: fast heart rate   Hydrochlorothiazide    REACTION: Hypokalemia   Pseudoephedrine    REACTION: Heart Rate        Medication List        Accurate as of February 08, 2022  9:50 AM. If you have any questions, ask your nurse or doctor.          STOP taking these medications    HYDROcodone-acetaminophen 5-325 MG tablet Commonly known as: NORCO/VICODIN Stopped by: Elayne Snare, MD   ibuprofen 400 MG tablet Commonly known as: ADVIL Stopped by: Elayne Snare, MD   ibuprofen 800 MG tablet Commonly known as: ADVIL Stopped by: Elayne Snare, MD   promethazine 12.5 MG tablet Commonly known as: PHENERGAN Stopped by: Elayne Snare, MD       TAKE these medications    amLODipine 10 MG tablet Commonly known as: NORVASC TAKE 1 TABLET BY MOUTH ONCE DAILY What changed: Another medication with the same name was removed. Continue taking this medication, and follow the directions you see here. Changed by: Elayne Snare, MD   amLODipine 10 MG tablet Commonly known as: NORVASC Take 1 tablet (10 mg total) by mouth daily. What changed: Another medication with the same name was removed. Continue taking this medication, and follow the directions you see here. Changed by: Elayne Snare, MD   atorvastatin 20 MG tablet Commonly known as: LIPITOR Take 20 mg by mouth daily. What changed: Another medication with the same name was removed. Continue taking this medication, and follow the directions  you see here. Changed by: Elayne Snare, MD   atorvastatin 20 MG tablet Commonly known as: LIPITOR TAKE 1 TABLET BY MOUTH ONCE DAILY What changed: Another medication with the same name was removed. Continue taking this medication, and follow the directions you see here. Changed by: Elayne Snare, MD   levothyroxine 88 MCG tablet Commonly known as: SYNTHROID Take 1 tablet (88 mcg total) by mouth daily.   lisinopril 20 MG tablet Commonly known as: ZESTRIL TAKE 1 TABLET BY MOUTH ONCE DAILY   lisinopril 40 MG tablet Commonly known as: ZESTRIL Take 1 tablet (40 mg total) by mouth daily.   lisinopril 20 MG tablet Commonly known as: ZESTRIL Take 20 mg by  mouth daily.           Review of Systems  She is followed by her PCP for hypertension  BP Readings from Last 3 Encounters:  02/08/22 138/84  06/30/21 (!) 150/96  02/18/20 (!) 162/108            Examination:    BP 138/84   Pulse 78   Ht '5\' 4"'$  (1.626 m)   Wt 213 lb 3.2 oz (96.7 kg)   SpO2 97%   BMI 36.60 kg/m   Right thyroid nodule is felt about 3 cm across and soft to firm and smooth Left side not palpable  Reflexes show normal relaxation No tremor   Assessment:  History of TOXIC nodular goiter dating back to 2009 treated with I-131 twice, last in 09/2017  Post ablative HYPOTHYROIDISM:  She had significant posttreatment hypothyroidism with TSH as high as 92 after her treatment  Although previously had required as much as 200 mcg of levothyroxine her dose subsequently has been significantly less She is taking a stable dose of 88 mcg since 10/2018  She has nonspecific fatigue related to other causes Has been regular with her levothyroxine but has not taken the extra half tablet weekly that was recommended However TSH is relatively better  GOITER: Has a stable right-sided enlargement which is relatively smooth and about the same size as before  PLAN:   She will continue 88 mcg of levothyroxine daily as  before  Follow-up in June 2024  Elayne Snare 02/08/2022, 9:50 AM      Note: This office note was prepared with Dragon voice recognition system technology. Any transcriptional errors that result from this process are unintentional.

## 2022-02-24 ENCOUNTER — Encounter: Payer: Self-pay | Admitting: Gastroenterology

## 2022-03-06 ENCOUNTER — Ambulatory Visit (INDEPENDENT_AMBULATORY_CARE_PROVIDER_SITE_OTHER): Payer: No Typology Code available for payment source

## 2022-03-06 ENCOUNTER — Other Ambulatory Visit (HOSPITAL_COMMUNITY): Payer: Self-pay

## 2022-03-06 ENCOUNTER — Encounter: Payer: Self-pay | Admitting: Orthopedic Surgery

## 2022-03-06 ENCOUNTER — Ambulatory Visit (INDEPENDENT_AMBULATORY_CARE_PROVIDER_SITE_OTHER): Payer: No Typology Code available for payment source | Admitting: Orthopedic Surgery

## 2022-03-06 ENCOUNTER — Telehealth: Payer: Self-pay | Admitting: Orthopedic Surgery

## 2022-03-06 VITALS — BP 154/97 | HR 86 | Ht 64.0 in | Wt 214.4 lb

## 2022-03-06 DIAGNOSIS — M5432 Sciatica, left side: Secondary | ICD-10-CM

## 2022-03-06 DIAGNOSIS — G8929 Other chronic pain: Secondary | ICD-10-CM

## 2022-03-06 DIAGNOSIS — M25562 Pain in left knee: Secondary | ICD-10-CM

## 2022-03-06 DIAGNOSIS — M545 Low back pain, unspecified: Secondary | ICD-10-CM

## 2022-03-06 DIAGNOSIS — M1712 Unilateral primary osteoarthritis, left knee: Secondary | ICD-10-CM | POA: Diagnosis not present

## 2022-03-06 DIAGNOSIS — Z9889 Other specified postprocedural states: Secondary | ICD-10-CM | POA: Diagnosis not present

## 2022-03-06 MED ORDER — PREDNISONE 10 MG (48) PO TBPK
ORAL_TABLET | Freq: Every day | ORAL | 0 refills | Status: DC
  Filled 2022-03-06: qty 48, 12d supply, fill #0

## 2022-03-06 MED ORDER — TIZANIDINE HCL 4 MG PO TABS
4.0000 mg | ORAL_TABLET | Freq: Every day | ORAL | 1 refills | Status: DC
  Filled 2022-03-06: qty 30, 30d supply, fill #0

## 2022-03-06 MED ORDER — GABAPENTIN 300 MG PO CAPS
300.0000 mg | ORAL_CAPSULE | Freq: Three times a day (TID) | ORAL | 5 refills | Status: DC
  Filled 2022-03-06: qty 90, 30d supply, fill #0

## 2022-03-06 NOTE — Therapy (Signed)
OUTPATIENT PHYSICAL THERAPY THORACOLUMBAR EVALUATION   Patient Name: Jodi Wade MRN: [id number] DOB:1961/04/24, 61 y.o., female Today's Date: 03/07/2022   PT End of Session - 03/07/22 1030     Visit Number 1    Number of Visits 8    Date for PT Re-Evaluation 04/04/22    Authorization Type [employer]; no VL, no auth; $40.00 copay    Progress Note Due on Visit 8    PT Start Time 1030    PT Stop Time 1114    PT Time Calculation (min) 44 min    Activity Tolerance Patient tolerated treatment well    Behavior During Therapy WFL for tasks assessed/performed             Past Medical History:  Diagnosis Date   Allergy    Diabetes mellitus without complication ([place])    Hypertension    Thyroid disease    Past Surgical History:  Procedure Laterality Date   BREAST BIOPSY Right    KNEE ARTHROSCOPY WITH MEDIAL MENISECTOMY Right 01/13/2020   Procedure: KNEE ARTHROSCOPY WITH MEDIAL AND LATERAL  MENISCECTOMY;  Surgeon: Carole Civil, MD;  Location: [facility];  Service: Orthopedics;  Laterality: Right;   PARTIAL HYSTERECTOMY     abdominal   Patient Active Problem List   Diagnosis Date Noted   S/P arthroscopy of right knee 01/13/20  02/16/2020   Derangement of posterior horn of medial meniscus of right knee    Meniscus, lateral, derangement, right    Osteoarthritis of knee 07/19/2018   Pain in right knee 06/11/2018   Postablative hypothyroidism 04/22/2018   Hyperlipemia 01/13/2016   VITAMIN D DEFICIENCY 04/01/2008    PCP: Kathyrn Lass, MD  REFERRING PROVIDER: Carole Civil, MD [facility]TH[facility]   REFERRING DIAG: M54.50 (ICD-10-CM) - Lumbar pain M54.32 (ICD-10-CM) - Sciatica, left side   Rationale for Evaluation and Treatment Rehabilitation  THERAPY DIAG:  Other abnormalities of gait and mobility - Plan: PT plan of care cert/re-cert  Difficulty in walking, not elsewhere classified - Plan: PT plan of care cert/re-cert  Pain in left leg  - Plan: PT plan of care cert/re-cert  Acute left-sided low back pain with left-sided sciatica - Plan: PT plan of care cert/re-cert  ONSET DATE: about 3 weeks ago  SUBJECTIVE:                                                                                                                                                                                           SUBJECTIVE STATEMENT: Lifts her grandson's (twins) in car seat weighs maybe 30 lbs total;  was about 3  weeks ago took them to the doctor. Had to navigate several steps. Pain started in the back and then started down the back of the leg and lateral leg down to the ankle.  Saw Dr. Aline Brochure; did some x-ray's noted degenerative changes in low back and knees.  Left knee felt like it was going to buckle some. PERTINENT HISTORY:  Bilateral knee arthritis Right > left Right knee surgery 2 years ago  PAIN:  Are you having pain? Yes: NPRS scale: 7.5/10 Pain location: most behind the left knee but from low back down to ankle Pain description: radiating, aching and sore Aggravating factors: sitting Relieving factors: standing and walking   PRECAUTIONS: None  WEIGHT BEARING RESTRICTIONS No  FALLS:  Has patient fallen in last 6 months? 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[employer]; superwoman  PLOF: Independent  PATIENT GOALS be able to walk better on left leg without it hurting   OBJECTIVE:   DIAGNOSTIC FINDINGS:  Left hip pain possible back pain   Lumbar spine 2-3 views   Increased lumbar lordosis just a slight abnormality on the [facility] x-ray in terms of alignment   Bony sclerosis and joint space narrowing is seen at L3-4, L4-5, L5-S1   Impression spondylosis without spondylolysis or listhesis lumbar spine mild to  moderate  Left knee pain   Left leg pain   3 views left knee   Alignment is still valgus as is the right knee which is seen only on the [facility] image   Peaking of the tibial spine slight narrowing of the medial compartment is noted patellofemoral joint has osteophytes superiorly and inferiorly   Grade 2 arthritis of the left knee with probable grade 3 arthritis on the right knee although further imaging needed    PATIENT SURVEYS:  FOTO 50   COGNITION:  Overall cognitive status: Within functional limits for tasks assessed     SENSATION: Feeling of numbness in left leg    POSTURE: rounded shoulders, forward head, and increased lumbar lordosis  PALPATION: Tender mid lumbar spine paraspinals.  LUMBAR ROM:   Active  AROM  eval  Flexion 80% " pulling"  Extension 50% available  Right lateral flexion 75% available  Left lateral flexion 70% available  Right rotation   Left rotation    (Blank rows = not tested)    LOWER EXTREMITY MMT:    MMT Right eval Left eval  Hip flexion 4+ 4-  Hip extension 4 4-*  Hip abduction    Hip adduction    Hip internal rotation    Hip external rotation    Knee flexion 4+ 4  Knee extension 5 4+ *pulling*  Ankle dorsiflexion 5 4  Ankle plantarflexion    Ankle inversion    Ankle eversion     (Blank rows = not tested)   FUNCTIONAL TESTS:  30 seconds chair stand test x 2  GAIT: Distance walked: 56 Assistive device utilized: None Level of assistance: SBA Comments: antalgic gait; decreased stance left leg    TODAY'S TREATMENT  Physical therapy evaluation and HEP instruction   PATIENT EDUCATION:  Education details: Patient educated on exam findings, POC, scope of PT, HEP. Person educated: Patient Education method: Explanation, Demonstration, and Handouts Education comprehension: verbalized understanding, returned demonstration, verbal cues required, and tactile cues required  HOME EXERCISE PROGRAM: Access Code:  [email or web address] URL: [email or web address] Date: 03/07/2022 Prepared by: [facility] - [facility]  Exercises - Supine Transversus Abdominis Bracing - Hands on Stomach  - 2 x daily - 7 x weekly - 1 sets - 10 reps - Prone Press Up  - 5 x daily - 7 x weekly - 1 sets - 10 reps - Seated Correct Posture  - 1 x daily - 7 x weekly - 3 sets - 10 reps  ASSESSMENT:  CLINICAL IMPRESSION: Patient is a 61 y.o. female  who was seen today for physical therapy evaluation and treatment for M54.50 (ICD-10-CM) - Lumbar pain M54.32 (ICD-10-CM) - Sciatica, left side . Patient presents on evaluation with limited lumbar mobility; tenderness of the mid lumbar spine paraspinals, left leg weakness > right leg weakness, impaired sitting posture and antalgic gait all of which negatively impact patient's tolerance for work, recreation and taking care of her grandchildren.patient with centralizing pain with extension today. Patient will benefit from continued skilled therapy services to address deficits and promote return to optimal function.       OBJECTIVE IMPAIRMENTS Abnormal gait, decreased activity tolerance, decreased balance, decreased endurance, decreased knowledge of condition, decreased mobility, difficulty walking, decreased ROM, decreased strength, hypomobility, increased fascial restrictions, impaired perceived functional ability, increased muscle spasms, impaired flexibility, impaired sensation, postural dysfunction, and pain.   ACTIVITY LIMITATIONS carrying, lifting, bending, sitting, standing, squatting, sleeping, stairs, transfers, locomotion level, and caring for others  PARTICIPATION LIMITATIONS: meal prep, cleaning, laundry, shopping, community activity, occupation, and yard work   [employer] POTENTIAL: Good  CLINICAL DECISION MAKING: Stable/uncomplicated  EVALUATION COMPLEXITY: Low   GOALS: Goals reviewed with patient? No  SHORT TERM GOALS: Target date: 03/21/2022  patient will be independent with  initial HEP   Baseline: Goal status: INITIAL  2.  Patient will report at least 50% improvement in overall symptoms and/or function to demonstrate improved functional mobility  Baseline:  Goal status: INITIAL   LONG TERM GOALS: Target date: 04/04/2022   Patient will be independent with advanced HEP and self management strategies to improve quality of life and functional outcomes.  Baseline:  Goal status: INITIAL  2.  Patient will improve FOTO score to predicted value to demonstrate improved functional mobility  Baseline: 50 Goal status: INITIAL  3.  Patient will improve 30 sec STS score from 2 sec to 5 sec to demonstrate improved functional mobility and increased lower extremity strength.  Baseline:  Goal status: INITIAL  4.   Patient will increase all tested lower extremity MMTs to 4+-5/5 to promote return to ambulation community distances with minimal deviation.  Baseline:  Goal status: INITIAL  5.  Patient will ambulate x 226 ft without gait deviation to navigate household and community distances more efficiently. Baseline:  Goal status: INITIAL  PLAN: PT FREQUENCY: 2x/week  PT DURATION: 4 weeks  PLANNED INTERVENTIONS: Therapeutic exercises, Therapeutic activity, Neuromuscular re-education, Balance training, Gait training, Patient/Family education, Joint manipulation, Joint mobilization, Stair training, Orthotic/Fit training, DME instructions, Aquatic Therapy, Dry Needling, Electrical stimulation, Spinal manipulation, Spinal mobilization, Cryotherapy, Moist heat, Compression bandaging, scar mobilization, Splintting, Taping, Traction, Ultrasound, Ionotophoresis '4mg'$ /ml Dexamethasone, and Manual therapy   PLAN FOR NEXT SESSION: Review HEP and goals; centralize pain to back and then core strengthening.   12:07 PM, 03/07/22 Fortino Haag Small Alexina Niccoli MPT [facility] [place] [phone]

## 2022-03-06 NOTE — Patient Instructions (Signed)
Take medication as directed. Dr.Harrison will send your prescriptions in by the end of the day today.

## 2022-03-06 NOTE — Telephone Encounter (Signed)
Patient asked at check out if Dr Aline Brochure would order physical therapy. Patient relays she works at [employer] in [place], and would like it to be there. Please advise.

## 2022-03-06 NOTE — Telephone Encounter (Signed)
Put in order called patient to advise.

## 2022-03-06 NOTE — Progress Notes (Signed)
Chief Complaint  Patient presents with   Back Pain    Pain radiating down to L foot/knee. Been hurting for about 2.5 wks. My knee keeps giving way   62 year old female status post arthroscopy of the right knee in 2021 did well.  Comes in with 2 and half weeks complaints of pain radiating from the left hip down to the left knee with giving out symptoms.  She has 2 new grandchildren twins she has been lifting them out of the car and relates onset of symptoms 2 this activity.  He did try some over-the-counter medication did not help (Aleve)  Review of systems giving way symptoms of the left knee seemed related to the left leg pain.  Bowel and bladder function are intact  Past Medical History:  Diagnosis Date   Allergy    Diabetes mellitus without complication ([place])    Hypertension    Thyroid disease    Past Surgical History:  Procedure Laterality Date   BREAST BIOPSY Right    KNEE ARTHROSCOPY WITH MEDIAL MENISECTOMY Right 01/13/2020   Procedure: KNEE ARTHROSCOPY WITH MEDIAL AND LATERAL  MENISCECTOMY;  Surgeon: Carole Civil, MD;  Location: [facility];  Service: Orthopedics;  Laterality: Right;   PARTIAL HYSTERECTOMY     abdominal   Social History   Tobacco Use   Smoking status: Never   Smokeless tobacco: Never  Substance Use Topics   Alcohol use: No   Drug use: No    BP (!) 154/97   Pulse 86   Ht '5\' 4"'$  (1.626 m)   Wt 214 lb 6 oz (97.2 kg)   BMI 36.80 kg/m   Physical Exam Constitutional:      General: She is not in acute distress.    Appearance: Normal appearance. She is not ill-appearing, toxic-appearing or diaphoretic.  Eyes:     General: No scleral icterus.    Extraocular Movements: Extraocular movements intact.     Conjunctiva/sclera: Conjunctivae normal.     Pupils: Pupils are equal, round, and reactive to light.  Cardiovascular:     Pulses: Normal pulses.  Musculoskeletal:     Comments: Lumbar spine is tender on the left side with increased muscle tension  on the left side as well.  Straight leg raise is negative.  Gait normal  Hip range of motion normal  Left knee range of motion normal no effusion, meniscal signs negative and ligaments stable  Tenderness on the left side of the upper thigh and iliotibial band  Skin:    General: Skin is warm.     Capillary Refill: Capillary refill takes less than 2 seconds.  Neurological:     General: No focal deficit present.     Mental Status: She is alert and oriented to person, place, and time.     Cranial Nerves: No cranial nerve deficit.     Sensory: No sensory deficit.     Motor: No weakness.     Coordination: Coordination normal.     Gait: Gait normal.     Deep Tendon Reflexes: Reflexes normal.  Psychiatric:        Mood and Affect: Mood normal.        Behavior: Behavior normal.        Thought Content: Thought content normal.        Judgment: Judgment normal.     Left knee images show medial compartment gonarthrosis  Lumbar spine mild degenerative changes overall alignment is reasonable  Encounter Diagnoses  Name Primary?   Chronic  pain of right knee    Lumbar pain    Sciatica, left side Yes   S/P arthroscopy of right knee 01/13/20       Assessment and plan 61 year old female with sciatic-like symptoms left leg  Recommend treating the inflammation, pain and muscle spasms with 3 medications which were reviewed with the patient  Meds ordered this encounter  Medications   gabapentin (NEURONTIN) 300 MG capsule    Sig: Take 1 capsule (300 mg total) by mouth 3 (three) times daily.    Dispense:  90 capsule    Refill:  5   tiZANidine (ZANAFLEX) 4 MG tablet    Sig: Take 1 tablet (4 mg total) by mouth daily.    Dispense:  30 tablet    Refill:  1   predniSONE (STERAPRED UNI-PAK 48 TAB) 10 MG (48) TBPK tablet    Sig: Take as directed per package    Dispense:  48 tablet    Refill:  0    Return in 6 weeks to see if further imaging is needed

## 2022-03-06 NOTE — Telephone Encounter (Signed)
order

## 2022-03-07 ENCOUNTER — Ambulatory Visit (HOSPITAL_COMMUNITY): Payer: No Typology Code available for payment source | Attending: Orthopedic Surgery

## 2022-03-07 ENCOUNTER — Other Ambulatory Visit (HOSPITAL_COMMUNITY): Payer: Self-pay

## 2022-03-07 DIAGNOSIS — M79605 Pain in left leg: Secondary | ICD-10-CM | POA: Diagnosis present

## 2022-03-07 DIAGNOSIS — R2689 Other abnormalities of gait and mobility: Secondary | ICD-10-CM | POA: Diagnosis present

## 2022-03-07 DIAGNOSIS — R262 Difficulty in walking, not elsewhere classified: Secondary | ICD-10-CM | POA: Insufficient documentation

## 2022-03-07 DIAGNOSIS — M5442 Lumbago with sciatica, left side: Secondary | ICD-10-CM | POA: Diagnosis present

## 2022-03-07 DIAGNOSIS — M545 Low back pain, unspecified: Secondary | ICD-10-CM | POA: Diagnosis not present

## 2022-03-07 DIAGNOSIS — M5432 Sciatica, left side: Secondary | ICD-10-CM | POA: Insufficient documentation

## 2022-03-08 ENCOUNTER — Ambulatory Visit (HOSPITAL_COMMUNITY): Payer: No Typology Code available for payment source

## 2022-03-08 ENCOUNTER — Encounter (HOSPITAL_COMMUNITY): Payer: No Typology Code available for payment source | Admitting: Physical Therapy

## 2022-03-08 DIAGNOSIS — M5442 Lumbago with sciatica, left side: Secondary | ICD-10-CM

## 2022-03-08 DIAGNOSIS — R2689 Other abnormalities of gait and mobility: Secondary | ICD-10-CM

## 2022-03-08 DIAGNOSIS — R262 Difficulty in walking, not elsewhere classified: Secondary | ICD-10-CM

## 2022-03-08 DIAGNOSIS — M79605 Pain in left leg: Secondary | ICD-10-CM

## 2022-03-08 NOTE — Therapy (Signed)
OUTPATIENT PHYSICAL THERAPY THORACOLUMBAR EVALUATION   Patient Name: Jodi Wade MRN: [id number] DOB:01-Feb-1961, 61 y.o., female Today's Date: 03/08/2022   PT End of Session - 03/08/22 1435     Visit Number 2    Number of Visits 8    Date for PT Re-Evaluation 04/04/22    Authorization Type [employer]; no VL, no auth; $40.00 copay    Progress Note Due on Visit 8    PT Start Time 1431    PT Stop Time 1510    PT Time Calculation (min) 39 min    Activity Tolerance Patient tolerated treatment well    Behavior During Therapy WFL for tasks assessed/performed              Past Medical History:  Diagnosis Date   Allergy    Diabetes mellitus without complication ([place])    Hypertension    Thyroid disease    Past Surgical History:  Procedure Laterality Date   BREAST BIOPSY Right    KNEE ARTHROSCOPY WITH MEDIAL MENISECTOMY Right 01/13/2020   Procedure: KNEE ARTHROSCOPY WITH MEDIAL AND LATERAL  MENISCECTOMY;  Surgeon: Carole Civil, MD;  Location: [facility];  Service: Orthopedics;  Laterality: Right;   PARTIAL HYSTERECTOMY     abdominal   Patient Active Problem List   Diagnosis Date Noted   S/P arthroscopy of right knee 01/13/20  02/16/2020   Derangement of posterior horn of medial meniscus of right knee    Meniscus, lateral, derangement, right    Osteoarthritis of knee 07/19/2018   Pain in right knee 06/11/2018   Postablative hypothyroidism 04/22/2018   Hyperlipemia 01/13/2016   VITAMIN D DEFICIENCY 04/01/2008    PCP: Kathyrn Lass, MD  REFERRING PROVIDER: Carole Civil, MD [facility]TH[facility] CARE [facility]   REFERRING DIAG: M54.50 (ICD-10-CM) - Lumbar pain M54.32 (ICD-10-CM) - Sciatica, left side   Rationale for Evaluation and Treatment Rehabilitation  THERAPY DIAG:  Other abnormalities of gait and mobility  Difficulty in walking, not elsewhere classified  Pain in left leg  Acute left-sided low back pain with left-sided sciatica  ONSET  DATE: about 3 weeks ago  SUBJECTIVE:                                                                                                                                                                                           SUBJECTIVE STATEMENT: Hurting more at the end of the day yesterday; stayed in calf and knee area left side not quite as far down.  Also got medication; prednisone and gabapentin and robaxin. Started medication last night  Eval:Lifts her grandson's (twins) in car seat  weighs maybe 30 lbs total;  was about 3 weeks ago took them to the doctor. Had to navigate several steps. Pain started in the back and then started down the back of the leg and lateral leg down to the ankle.  Saw Dr. Aline Brochure; did some x-ray's noted degenerative changes in low back and knees.  Left knee felt like it was going to buckle some. PERTINENT HISTORY:  Bilateral knee arthritis Right > left Right knee surgery 2 years ago  PAIN:  Are you having pain? Yes: NPRS scale: 7.5/10 Pain location: most behind the left knee but from low back down to ankle Pain description: radiating, aching and sore Aggravating factors: sitting Relieving factors: standing and walking   PRECAUTIONS: None  WEIGHT BEARING RESTRICTIONS No  FALLS:  Has patient fallen in last 6 months? 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; superwoman  PLOF: Independent  PATIENT GOALS be able to walk better on left leg without it hurting   OBJECTIVE:   DIAGNOSTIC FINDINGS:  Left hip pain possible back pain   Lumbar spine 2-3 views   Increased lumbar lordosis just a slight abnormality on the [facility] x-ray in terms of alignment   Bony sclerosis and joint space narrowing is seen at L3-4, L4-5, L5-S1   Impression  spondylosis without spondylolysis or listhesis lumbar spine mild to moderate  Left knee pain   Left leg pain   3 views left knee   Alignment is still valgus as is the right knee which is seen only on the [facility] image   Peaking of the tibial spine slight narrowing of the medial compartment is noted patellofemoral joint has osteophytes superiorly and inferiorly   Grade 2 arthritis of the left knee with probable grade 3 arthritis on the right knee although further imaging needed    PATIENT SURVEYS:  FOTO 50   COGNITION:  Overall cognitive status: Within functional limits for tasks assessed     SENSATION: Feeling of numbness in left leg    POSTURE: rounded shoulders, forward head, and increased lumbar lordosis  PALPATION: Tender mid lumbar spine paraspinals.  LUMBAR ROM:   Active  AROM  eval  Flexion 80% " pulling"  Extension 50% available  Right lateral flexion 75% available  Left lateral flexion 70% available  Right rotation   Left rotation    (Blank rows = not tested)    LOWER EXTREMITY MMT:    MMT Right eval Left eval  Hip flexion 4+ 4-  Hip extension 4 4-*  Hip abduction    Hip adduction    Hip internal rotation    Hip external rotation    Knee flexion 4+ 4  Knee extension 5 4+ *pulling*  Ankle dorsiflexion 5 4  Ankle plantarflexion    Ankle inversion    Ankle eversion     (Blank rows = not tested)   FUNCTIONAL TESTS:  30 seconds chair stand test x 2  GAIT: Distance walked: 56 Assistive device utilized: None Level of assistance: SBA Comments: antalgic gait; decreased stance left leg    TODAY'S TREATMENT  03/08/22 Review of HEP and goals Prone: Prone lying x 5' with moist heat Prone press up 2 x 10 Prone press up with overpressure x 8 Manual grade 2 mobs L3-4 area x 10  Supine transverse abdominus 5" hold x 10  Standing lumbar extensions x 10  Physical therapy evaluation and HEP instruction   PATIENT EDUCATION:  Education  details: Patient educated on exam findings, POC, scope of PT, HEP. Person educated: Patient Education method: Explanation, Demonstration, and Handouts Education comprehension: verbalized understanding, returned demonstration, verbal cues required, and tactile cues required    HOME EXERCISE PROGRAM: 03/08/22 Standing lumbar extensions over counter x 10  Access Code: [email or web address] URL: [email or web address] Date: 03/07/2022 Prepared by: [facility] - Rehab  Exercises - Supine Transversus Abdominis Bracing - Hands on Stomach  - 2 x daily - 7 x weekly - 1 sets - 10 reps - Prone Press Up  - 5 x daily - 7 x weekly - 1 sets - 10 reps - Seated Correct Posture  - 1 x daily - 7 x weekly - 3 sets - 10 reps  ASSESSMENT:  CLINICAL IMPRESSION: Today's session started with review of HEP and set rehab goals.  Patient verbalizes agreement with set rehab goals. Pain is now moved more centrally out of the ankle area to the back of the knee. Patient symptoms decrease with lumbar extensions with overpressure and in standing.  Noted less antalgic gait after treatment. Patient sits for the majority of her work day so encouraged to get up and perform extensions every 2 hours to try to minimize the effects of prolonged sitting. Patient will benefit from continued skilled therapy services to address deficits and promote return to optimal function.       OBJECTIVE IMPAIRMENTS Abnormal gait, decreased activity tolerance, decreased balance, decreased endurance, decreased knowledge of condition, decreased mobility, difficulty walking, decreased ROM, decreased strength, hypomobility, increased fascial restrictions, impaired perceived functional ability, increased muscle spasms, impaired flexibility, impaired sensation, postural dysfunction, and pain.   ACTIVITY LIMITATIONS carrying, lifting, bending, sitting, standing, squatting, sleeping, stairs, transfers, locomotion level, and caring for others  PARTICIPATION  LIMITATIONS: meal prep, cleaning, laundry, shopping, community activity, occupation, and yard work   [employer] POTENTIAL: Good  CLINICAL DECISION MAKING: Stable/uncomplicated  EVALUATION COMPLEXITY: Low   GOALS: Goals reviewed with patient? Yes  SHORT TERM GOALS: Target date: 03/21/2022  patient will be independent with initial HEP   Baseline: Goal status: IN PROGRESS  2.  Patient will report at least 50% improvement in overall symptoms and/or function to demonstrate improved functional mobility  Baseline:  Goal status: IN PROGRESS   LONG TERM GOALS: Target date: 04/04/2022   Patient will be independent with advanced HEP and self management strategies to improve quality of life and functional outcomes.  Baseline:  Goal status: IN PROGRESS  2.  Patient will improve FOTO score to predicted value to demonstrate improved functional mobility  Baseline: 50 Goal status: IN PROGRESS  3.  Patient will improve 30 sec STS score from 2 sec to 5 sec to demonstrate improved functional mobility and increased lower extremity strength.  Baseline:  Goal status: IN PROGRESS  4.   Patient will increase all tested lower extremity MMTs to 4+-5/5 to promote return to ambulation community distances with minimal deviation.  Baseline:  Goal status: IN PROGRESS  5.  Patient will ambulate x 226 ft without gait deviation to navigate household and community distances more efficiently. Baseline:  Goal status: IN PROGRESS  PLAN: PT FREQUENCY: 2x/week  PT DURATION: 4 weeks  PLANNED INTERVENTIONS: Therapeutic exercises, Therapeutic activity, Neuromuscular re-education, Balance training, Gait training, Patient/Family education, Joint manipulation, Joint mobilization, Stair training, Orthotic/Fit training, DME instructions, Aquatic Therapy, Dry Needling, Electrical stimulation, Spinal  manipulation, Spinal mobilization, Cryotherapy, Moist heat, Compression bandaging, scar mobilization, Splintting,  Taping, Traction, Ultrasound, Ionotophoresis '4mg'$ /ml Dexamethasone, and Manual therapy   PLAN FOR NEXT SESSION:  centralize pain to back and then core strengthening.   3:14 PM, 03/08/22 [place] Sardo Small Jamarri Vuncannon MPT [facility] [place] [phone]

## 2022-03-13 ENCOUNTER — Ambulatory Visit (HOSPITAL_COMMUNITY): Payer: No Typology Code available for payment source

## 2022-03-13 DIAGNOSIS — M5442 Lumbago with sciatica, left side: Secondary | ICD-10-CM

## 2022-03-13 DIAGNOSIS — R262 Difficulty in walking, not elsewhere classified: Secondary | ICD-10-CM

## 2022-03-13 DIAGNOSIS — M79605 Pain in left leg: Secondary | ICD-10-CM

## 2022-03-13 DIAGNOSIS — R2689 Other abnormalities of gait and mobility: Secondary | ICD-10-CM | POA: Diagnosis not present

## 2022-03-13 NOTE — Therapy (Signed)
OUTPATIENT PHYSICAL THERAPY THORACOLUMBAR EVALUATION   Patient Name: Jodi Wade MRN: [id number] DOB:September 17, 1960, 61 y.o., female Today's Date: 03/13/2022   PT End of Session - 03/13/22 1528     Visit Number 3    Number of Visits 8    Date for PT Re-Evaluation 04/04/22    Authorization Type [employer]; no VL, no auth; $40.00 copay    Progress Note Due on Visit 8    PT Start Time 1519    PT Stop Time 1557    PT Time Calculation (min) 38 min    Activity Tolerance Patient tolerated treatment well    Behavior During Therapy WFL for tasks assessed/performed               Past Medical History:  Diagnosis Date   Allergy    Diabetes mellitus without complication ([place])    Hypertension    Thyroid disease    Past Surgical History:  Procedure Laterality Date   BREAST BIOPSY Right    KNEE ARTHROSCOPY WITH MEDIAL MENISECTOMY Right 01/13/2020   Procedure: KNEE ARTHROSCOPY WITH MEDIAL AND LATERAL  MENISCECTOMY;  Surgeon: Carole Civil, MD;  Location: [facility];  Service: Orthopedics;  Laterality: Right;   PARTIAL HYSTERECTOMY     abdominal   Patient Active Problem List   Diagnosis Date Noted   S/P arthroscopy of right knee 01/13/20  02/16/2020   Derangement of posterior horn of medial meniscus of right knee    Meniscus, lateral, derangement, right    Osteoarthritis of knee 07/19/2018   Pain in right knee 06/11/2018   Postablative hypothyroidism 04/22/2018   Hyperlipemia 01/13/2016   VITAMIN D DEFICIENCY 04/01/2008    PCP: Kathyrn Lass, MD  REFERRING PROVIDER: Carole Civil, MD [facility]TH[facility] C[facility]   REFERRING DIAG: M54.50 (ICD-10-CM) - Lumbar pain M54.32 (ICD-10-CM) - Sciatica, left side   Rationale for Evaluation and Treatment Rehabilitation  THERAPY DIAG:  Other abnormalities of gait and mobility  Difficulty in walking, not elsewhere classified  Pain in left leg  Acute left-sided low back pain with left-sided sciatica  ONSET  DATE: about 3 weeks ago  SUBJECTIVE:                                                                                                                                                                                           SUBJECTIVE STATEMENT: Much less pain; but still limp some although walking better; difficulty with steps still.   Eval:Lifts her grandson's (twins) in car seat weighs maybe 30 lbs total;  was about 3 weeks ago took them to the doctor. Had to navigate  several steps. Pain started in the back and then started down the back of the leg and lateral leg down to the ankle.  Saw Dr. Aline Brochure; did some x-ray's noted degenerative changes in low back and knees.  Left knee felt like it was going to buckle some. PERTINENT HISTORY:  Bilateral knee arthritis Right > left Right knee surgery 2 years ago  PAIN:  Are you having pain? Yes: NPRS scale: 1/10 Pain location: low back mostly; some into the left knee Pain description: radiating, aching and sore Aggravating factors: sitting Relieving factors: standing and walking   PRECAUTIONS: None  WEIGHT BEARING RESTRICTIONS No  FALLS:  Has patient fallen in last 6 months? 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; superwoman  PLOF: Independent  PATIENT GOALS be able to walk better on left leg without it hurting   OBJECTIVE:   DIAGNOSTIC FINDINGS:  Left hip pain possible back pain   Lumbar spine 2-3 views   Increased lumbar lordosis just a slight abnormality on the [facility] x-ray in terms of alignment   Bony sclerosis and joint space narrowing is seen at L3-4, L4-5, L5-S1   Impression spondylosis without spondylolysis or listhesis lumbar spine mild to moderate  Left knee pain   Left leg pain   3 views left  knee   Alignment is still valgus as is the right knee which is seen only on the [facility] image   Peaking of the tibial spine slight narrowing of the medial compartment is noted patellofemoral joint has osteophytes superiorly and inferiorly   Grade 2 arthritis of the left knee with probable grade 3 arthritis on the right knee although further imaging needed    PATIENT SURVEYS:  FOTO 50   COGNITION:  Overall cognitive status: Within functional limits for tasks assessed     SENSATION: Feeling of numbness in left leg    POSTURE: rounded shoulders, forward head, and increased lumbar lordosis  PALPATION: Tender mid lumbar spine paraspinals.  LUMBAR ROM:   Active  AROM  eval  Flexion 80% " pulling"  Extension 50% available  Right lateral flexion 75% available  Left lateral flexion 70% available  Right rotation   Left rotation    (Blank rows = not tested)    LOWER EXTREMITY MMT:    MMT Right eval Left eval  Hip flexion 4+ 4-  Hip extension 4 4-*  Hip abduction    Hip adduction    Hip internal rotation    Hip external rotation    Knee flexion 4+ 4  Knee extension 5 4+ *pulling*  Ankle dorsiflexion 5 4  Ankle plantarflexion    Ankle inversion    Ankle eversion     (Blank rows = not tested)   FUNCTIONAL TESTS:  30 seconds chair stand test x 2  GAIT: Distance walked: 56 Assistive device utilized: None Level of assistance: SBA Comments: antalgic gait; decreased stance left leg    TODAY'S TREATMENT  03/13/22 Prone: Prone lying with moist heat on back x 5' Prone press ups x 10 Manual grade 2 mobs x 10  Supine: TA bracing 5" hold x 10 TA bracing with march x 10 TA bracing with hip adduction ball adduction 5" x 10 TA bracing with hip Abduction with belt 5" x 10 Left active hamstring  stretch with nerve floss 2 x 10  Standing lumbar extension over counter x 10  Log roll education     03/08/22 Review of HEP and goals Prone: Prone lying x 5' with  moist heat Prone press up 2 x 10 Prone press up with overpressure x 8 Manual grade 2 mobs L3-4 area x 10  Supine transverse abdominus 5" hold x 10  Standing lumbar extensions x 10  Physical therapy evaluation and HEP instruction   PATIENT EDUCATION:  Education details: Patient educated on exam findings, POC, scope of PT, HEP. Person educated: Patient Education method: Explanation, Demonstration, and Handouts Education comprehension: verbalized understanding, returned demonstration, verbal cues required, and tactile cues required    HOME EXERCISE PROGRAM: 03/13/22  TA abdominis with marching, hip ABD and adduction 03/08/22 Standing lumbar extensions over counter x 10  Access Code: [email or web address] URL: [email or web address] Date: 03/07/2022 Prepared by: [facility] - [facility]  Exercises - Supine Transversus Abdominis Bracing - Hands on Stomach  - 2 x daily - 7 x weekly - 1 sets - 10 reps - Prone Press Up  - 5 x daily - 7 x weekly - 1 sets - 10 reps - Seated Correct Posture  - 1 x daily - 7 x weekly - 3 sets - 10 reps  ASSESSMENT:  CLINICAL IMPRESSION: Today's session continued to focus on centralizing pain and then increasing core strength.  Pain mostly in the low back now; minimal pain into the posterior left knee.  Noted minimal limp with walking although still slightly decreased stance phase left leg versus right.  Progressed core exercise and educated patient on log roll to avoid twisting back with supine to sit and sit to supine. Patient will benefit from continued skilled therapy services to address deficits and promote return to optimal function.       OBJECTIVE IMPAIRMENTS Abnormal gait, decreased activity tolerance, decreased balance, decreased endurance, decreased knowledge of condition, decreased mobility, difficulty walking, decreased ROM, decreased strength, hypomobility, increased fascial restrictions, impaired perceived functional ability, increased muscle spasms,  impaired flexibility, impaired sensation, postural dysfunction, and pain.   ACTIVITY LIMITATIONS carrying, lifting, bending, sitting, standing, squatting, sleeping, stairs, transfers, locomotion level, and caring for others  PARTICIPATION LIMITATIONS: meal prep, cleaning, laundry, shopping, community activity, occupation, and yard work   [employer] POTENTIAL: Good  CLINICAL DECISION MAKING: Stable/uncomplicated  EVALUATION COMPLEXITY: Low   GOALS: Goals reviewed with patient? Yes  SHORT TERM GOALS: Target date: 03/21/2022  patient will be independent with initial HEP   Baseline: Goal status: IN PROGRESS  2.  Patient will report at least 50% improvement in overall symptoms and/or function to demonstrate improved functional mobility  Baseline:  Goal status: IN PROGRESS   LONG TERM GOALS: Target date: 04/04/2022   Patient will be independent with advanced HEP and self management strategies to improve quality of life and functional outcomes.  Baseline:  Goal status: IN PROGRESS  2.  Patient will improve FOTO score to predicted value to demonstrate improved functional mobility  Baseline: 50 Goal status: IN PROGRESS  3.  Patient will improve 30 sec STS score from 2 sec to 5 sec to demonstrate improved functional mobility and increased lower extremity strength.  Baseline:  Goal status: IN PROGRESS  4.   Patient will increase all tested lower extremity MMTs to 4+-5/5 to promote return to ambulation community distances with minimal deviation.  Baseline:  Goal status: IN PROGRESS  5.  Patient will ambulate x 226 ft without gait deviation to navigate household and  community distances more efficiently. Baseline:  Goal status: IN PROGRESS  PLAN: PT FREQUENCY: 2x/week  PT DURATION: 4 weeks  PLANNED INTERVENTIONS: Therapeutic exercises, Therapeutic activity, Neuromuscular re-education, Balance training, Gait training, Patient/Family education, Joint manipulation, Joint  mobilization, Stair training, Orthotic/Fit training, DME instructions, Aquatic Therapy, Dry Needling, Electrical stimulation, Spinal manipulation, Spinal mobilization, Cryotherapy, Moist heat, Compression bandaging, scar mobilization, Splintting, Taping, Traction, Ultrasound, Ionotophoresis '4mg'$ /ml Dexamethasone, and Manual therapy   PLAN FOR NEXT SESSION:  centralize pain to back and then core strengthening.   3:57 PM, 03/13/22 Skanda Worlds Small Jamiesha Victoria MPT [facility] [place] [phone]

## 2022-03-15 ENCOUNTER — Encounter (HOSPITAL_COMMUNITY): Payer: No Typology Code available for payment source | Admitting: Physical Therapy

## 2022-03-15 ENCOUNTER — Encounter (HOSPITAL_COMMUNITY): Payer: No Typology Code available for payment source

## 2022-03-20 ENCOUNTER — Other Ambulatory Visit (HOSPITAL_COMMUNITY): Payer: Self-pay

## 2022-03-20 ENCOUNTER — Ambulatory Visit (AMBULATORY_SURGERY_CENTER): Payer: No Typology Code available for payment source | Admitting: *Deleted

## 2022-03-20 VITALS — Ht 64.0 in | Wt 212.0 lb

## 2022-03-20 DIAGNOSIS — Z1211 Encounter for screening for malignant neoplasm of colon: Secondary | ICD-10-CM

## 2022-03-20 MED ORDER — NA SULFATE-K SULFATE-MG SULF 17.5-3.13-1.6 GM/177ML PO SOLN
1.0000 | Freq: Once | ORAL | 0 refills | Status: AC
  Filled 2022-03-20: qty 354, 1d supply, fill #0

## 2022-03-20 NOTE — Progress Notes (Signed)
No egg or soy allergy known to patient  No issues known to pt with past sedation with any surgeries or procedures Patient denies ever being told they had issues or difficulty with intubation  No FH of Malignant Hyperthermia Pt is not on diet pills Pt is not on  home 02  Pt is not on blood thinners  Pt denies issues with constipation  No A fib or A flutter Have any cardiac testing pending--no Pt instructed to use [email or web address] or GoodRx for a price reduction on prep    Pt. Was stuck in traffic pre-visit switched to tele-visit.

## 2022-03-23 ENCOUNTER — Telehealth: Payer: Self-pay | Admitting: Orthopedic Surgery

## 2022-03-23 NOTE — Telephone Encounter (Signed)
Patient called to relay that her left knee is hurting a lot; states she finished the prednisone, and has the other medications, but is asking if there is a pain medication she may get. If so, uses [employer].

## 2022-03-23 NOTE — Telephone Encounter (Signed)
We cant use opioids except for fractures and surgery but happy  to try other medications

## 2022-03-23 NOTE — Telephone Encounter (Signed)
She is not on anything for her pain once prednisone is done Not using  gabapentin she states its not nerve pain, not using any anti inflammatory either

## 2022-03-24 ENCOUNTER — Other Ambulatory Visit: Payer: Self-pay | Admitting: Orthopedic Surgery

## 2022-03-24 DIAGNOSIS — M5432 Sciatica, left side: Secondary | ICD-10-CM

## 2022-03-24 MED ORDER — IBUPROFEN 800 MG PO TABS: 800.0000 mg | ORAL_TABLET | Freq: Three times a day (TID) | ORAL | 1 refills | Status: DC | PRN

## 2022-03-24 NOTE — Telephone Encounter (Signed)
Have discussed with patient she voiced understanding.

## 2022-03-24 NOTE — Telephone Encounter (Signed)
Tylenol 500 mmg q 6 hrs   Ibuprofen rx 800 mg every 8 hrs   Gabapentin   Rest

## 2022-03-24 NOTE — Progress Notes (Signed)
Meds ordered this encounter  Medications   ibuprofen (ADVIL) 800 MG tablet    Sig: Take 1 tablet (800 mg total) by mouth every 8 (eight) hours as needed.    Dispense:  90 tablet    Refill:  1    

## 2022-03-28 ENCOUNTER — Ambulatory Visit (HOSPITAL_COMMUNITY): Payer: No Typology Code available for payment source

## 2022-03-28 DIAGNOSIS — M79605 Pain in left leg: Secondary | ICD-10-CM

## 2022-03-28 DIAGNOSIS — M5442 Lumbago with sciatica, left side: Secondary | ICD-10-CM

## 2022-03-28 DIAGNOSIS — R2689 Other abnormalities of gait and mobility: Secondary | ICD-10-CM | POA: Diagnosis not present

## 2022-03-28 DIAGNOSIS — R262 Difficulty in walking, not elsewhere classified: Secondary | ICD-10-CM

## 2022-03-28 NOTE — Therapy (Signed)
OUTPATIENT PHYSICAL THERAPY THORACOLUMBAR EVALUATION   Patient Name: Jodi Wade MRN: [id number] DOB:04/05/1961, 61 y.o., female Today's Date: 03/28/2022   PT End of Session - 03/28/22 1133     Visit Number 4    Number of Visits 8    Date for PT Re-Evaluation 04/04/22    Authorization Type [employer]; no VL, no auth; $40.00 copay    Progress Note Due on Visit 8    PT Start Time 1117    PT Stop Time 1155    PT Time Calculation (min) 38 min    Activity Tolerance Patient tolerated treatment well    Behavior During Therapy WFL for tasks assessed/performed                Past Medical History:  Diagnosis Date   Allergy    seasonal   Anemia    Anxiety    Arthritis    knee right   Diabetes mellitus without complication ([place])    pt.denies,don't   GERD (gastroesophageal reflux disease)    Heart murmur    Hyperlipidemia    controlled   Hypertension    Thyroid disease    Past Surgical History:  Procedure Laterality Date   BREAST BIOPSY Right    COLONOSCOPY     KNEE ARTHROSCOPY WITH MEDIAL MENISECTOMY Right 01/13/2020   Procedure: KNEE ARTHROSCOPY WITH MEDIAL AND LATERAL  MENISCECTOMY;  Surgeon: Carole Civil, MD;  Location: [facility];  Service: Orthopedics;  Laterality: Right;   PARTIAL HYSTERECTOMY     abdominal   Patient Active Problem List   Diagnosis Date Noted   S/P arthroscopy of right knee 01/13/20  02/16/2020   Derangement of posterior horn of medial meniscus of right knee    Meniscus, lateral, derangement, right    Osteoarthritis of knee 07/19/2018   Pain in right knee 06/11/2018   Postablative hypothyroidism 04/22/2018   Hyperlipemia 01/13/2016   VITAMIN D DEFICIENCY 04/01/2008    PCP: Kathyrn Lass, MD  REFERRING PROVIDER: Carole Civil, MD [facility] ORTHO CARE [facility]   REFERRING DIAG: M54.50 (ICD-10-CM) - Lumbar pain M54.32 (ICD-10-CM) - Sciatica, left side   Rationale for Evaluation and Treatment  Rehabilitation  THERAPY DIAG:  Difficulty in walking, not elsewhere classified  Pain in left leg  Other abnormalities of gait and mobility  Acute left-sided low back pain with left-sided sciatica  ONSET DATE: about 3 weeks ago  SUBJECTIVE:  SUBJECTIVE STATEMENT: Back feels good; only has a catch in her knee mostly with steps. Dr. Aline Brochure gave Tylenol for knee pain; helped with pain.   Eval:Lifts her grandson's (twins) in car seat weighs maybe 30 lbs total;  was about 3 weeks ago took them to the doctor. Had to navigate several steps. Pain started in the back and then started down the back of the leg and lateral leg down to the ankle.  Saw Dr. Aline Brochure; did some x-ray's noted degenerative changes in low back and knees.  Left knee felt like it was going to buckle some. PERTINENT HISTORY:  Bilateral knee arthritis Right > left Right knee surgery 2 years ago  PAIN:  Are you having pain? Yes: NPRS scale: 1/10 Pain location: low back mostly; some into the left knee Pain description: radiating, aching and sore Aggravating factors: sitting Relieving factors: standing and walking   PRECAUTIONS: None  WEIGHT BEARING RESTRICTIONS No  FALLS:  Has patient fallen in last 6 months? 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; superwoman  PLOF: Independent  PATIENT GOALS be able to walk better on left leg without it hurting   OBJECTIVE:   DIAGNOSTIC FINDINGS:  Left hip pain possible back pain   Lumbar spine 2-3 views   Increased lumbar lordosis just a slight abnormality on the [facility]  x-ray in terms of alignment   Bony sclerosis and joint space narrowing is seen at L3-4, L4-5, L5-S1   Impression spondylosis without spondylolysis or listhesis lumbar spine mild to moderate  Left knee pain   Left leg pain   3 views left knee   Alignment is still valgus as is the right knee which is seen only on the [facility] image   Peaking of the tibial spine slight narrowing of the medial compartment is noted patellofemoral joint has osteophytes superiorly and inferiorly   Grade 2 arthritis of the left knee with probable grade 3 arthritis on the right knee although further imaging needed    PATIENT SURVEYS:  FOTO 50   COGNITION:  Overall cognitive status: Within functional limits for tasks assessed     SENSATION: Feeling of numbness in left leg    POSTURE: rounded shoulders, forward head, and increased lumbar lordosis  PALPATION: Tender mid lumbar spine paraspinals.  LUMBAR ROM:   Active  AROM  eval  Flexion 80% " pulling"  Extension 50% available  Right lateral flexion 75% available  Left lateral flexion 70% available  Right rotation   Left rotation    (Blank rows = not tested)    LOWER EXTREMITY MMT:    MMT Right eval Left eval  Hip flexion 4+ 4-  Hip extension 4 4-*  Hip abduction    Hip adduction    Hip internal rotation    Hip external rotation    Knee flexion 4+ 4  Knee extension 5 4+ *pulling*  Ankle dorsiflexion 5 4  Ankle plantarflexion    Ankle inversion    Ankle eversion     (Blank rows = not tested)   FUNCTIONAL TESTS:  30 seconds chair stand test x 2  GAIT: Distance walked: 56 Assistive device utilized: None Level of assistance: SBA Comments: antalgic gait; decreased stance left leg    TODAY'S TREATMENT  03/28/22 Prone: Prone lying with moist  heat on back x 5' Prone press ups x 10 Prone glute sets 5" hold x 10  Supine  Bridge with hip abduction with belt 5" hold 2 x 10 Quad set 5" hold x 10 SAQ's 2 x 10 SLR 2 x  5  Sit to stand 2 x 10 4" step ups 2 x 10 Slant board 5 x 20"  Standing  H/S stretch  5 x 20" Standing lumbar extension over raised bed x 10   03/13/22 Prone: Prone lying with moist heat on back x 5' Prone press ups x 10 Manual grade 2 mobs x 10  Supine: TA bracing 5" hold x 10 TA bracing with march x 10 TA bracing with hip adduction ball adduction 5" x 10 TA bracing with hip Abduction with belt 5" x 10 Left active hamstring stretch with nerve floss 2 x 10  Standing lumbar extension over counter x 10  Log roll education     03/08/22 Review of HEP and goals Prone: Prone lying x 5' with moist heat Prone press up 2 x 10 Prone press up with overpressure x 8 Manual grade 2 mobs L3-4 area x 10  Supine transverse abdominus 5" hold x 10  Standing lumbar extensions x 10  Physical therapy evaluation and HEP instruction   PATIENT EDUCATION:  Education details: Patient educated on exam findings, POC, scope of PT, HEP. Person educated: Patient Education method: Explanation, Demonstration, and Handouts Education comprehension: verbalized understanding, returned demonstration, verbal cues required, and tactile cues required    HOME EXERCISE PROGRAM: 03/28/22 Quad set, SAQs, SLR 03/13/22  TA abdominis with marching, hip ABD and adduction 03/08/22 Standing lumbar extensions over counter x 10  Access Code: [email or web address] URL: [email or web address] Date: 03/07/2022 Prepared by: [facility] - [facility]  Exercises - Supine Transversus Abdominis Bracing - Hands on Stomach  - 2 x daily - 7 x weekly - 1 sets - 10 reps - Prone Press Up  - 5 x daily - 7 x weekly - 1 sets - 10 reps - Seated Correct Posture  - 1 x daily - 7 x weekly - 3 sets - 10 reps  ASSESSMENT:  CLINICAL IMPRESSION: Today's session focused on decreased left leg pain; her back and radiating pain seems to have resolved but she continues with knee pain likely due to arthritis.  Added left knee strengthening  exercise and stretching today; updated HEP. Continued with core strengthening as well.  Patient will benefit from continued skilled therapy services to address deficits and promote return to optimal function.       OBJECTIVE IMPAIRMENTS Abnormal gait, decreased activity tolerance, decreased balance, decreased endurance, decreased knowledge of condition, decreased mobility, difficulty walking, decreased ROM, decreased strength, hypomobility, increased fascial restrictions, impaired perceived functional ability, increased muscle spasms, impaired flexibility, impaired sensation, postural dysfunction, and pain.   ACTIVITY LIMITATIONS carrying, lifting, bending, sitting, standing, squatting, sleeping, stairs, transfers, locomotion level, and caring for others  PARTICIPATION LIMITATIONS: meal prep, cleaning, laundry, shopping, community activity, occupation, and yard work   [employer] POTENTIAL: Good  CLINICAL DECISION MAKING: Stable/uncomplicated  EVALUATION COMPLEXITY: Low   GOALS: Goals reviewed with patient? Yes  SHORT TERM GOALS: Target date: 03/21/2022  patient will be independent with initial HEP   Baseline: Goal status: IN PROGRESS  2.  Patient will report at least 50% improvement in overall symptoms and/or function to demonstrate improved functional mobility  Baseline:  Goal status: IN PROGRESS   LONG TERM GOALS: Target date: 04/04/2022   Patient will be independent  with advanced HEP and self management strategies to improve quality of life and functional outcomes.  Baseline:  Goal status: IN PROGRESS  2.  Patient will improve FOTO score to predicted value to demonstrate improved functional mobility  Baseline: 50 Goal status: IN PROGRESS  3.  Patient will improve 30 sec STS score from 2 sec to 5 sec to demonstrate improved functional mobility and increased lower extremity strength.  Baseline:  Goal status: IN PROGRESS  4.   Patient will increase all tested lower  extremity MMTs to 4+-5/5 to promote return to ambulation community distances with minimal deviation.  Baseline:  Goal status: IN PROGRESS  5.  Patient will ambulate x 226 ft without gait deviation to navigate household and community distances more efficiently. Baseline:  Goal status: IN PROGRESS  PLAN: PT FREQUENCY: 2x/week  PT DURATION: 4 weeks  PLANNED INTERVENTIONS: Therapeutic exercises, Therapeutic activity, Neuromuscular re-education, Balance training, Gait training, Patient/Family education, Joint manipulation, Joint mobilization, Stair training, Orthotic/Fit training, DME instructions, Aquatic Therapy, Dry Needling, Electrical stimulation, Spinal manipulation, Spinal mobilization, Cryotherapy, Moist heat, Compression bandaging, scar mobilization, Splintting, Taping, Traction, Ultrasound, Ionotophoresis '4mg'$ /ml Dexamethasone, and Manual therapy   PLAN FOR NEXT SESSION:  centralize pain to back and then core strengthening. Lower extremity strengthening as able.    11:56 AM, 03/28/22 Hezekiah Veltre Small Skyllar Notarianni MPT [facility] [place] [phone]

## 2022-03-29 ENCOUNTER — Encounter (HOSPITAL_COMMUNITY): Payer: No Typology Code available for payment source

## 2022-04-05 ENCOUNTER — Encounter (HOSPITAL_COMMUNITY): Payer: No Typology Code available for payment source

## 2022-04-12 ENCOUNTER — Encounter (HOSPITAL_COMMUNITY): Payer: No Typology Code available for payment source | Attending: Orthopedic Surgery

## 2022-04-12 DIAGNOSIS — M79605 Pain in left leg: Secondary | ICD-10-CM | POA: Diagnosis present

## 2022-04-12 DIAGNOSIS — M5442 Lumbago with sciatica, left side: Secondary | ICD-10-CM | POA: Insufficient documentation

## 2022-04-12 DIAGNOSIS — R262 Difficulty in walking, not elsewhere classified: Secondary | ICD-10-CM | POA: Insufficient documentation

## 2022-04-12 DIAGNOSIS — R2689 Other abnormalities of gait and mobility: Secondary | ICD-10-CM | POA: Insufficient documentation

## 2022-04-12 NOTE — Therapy (Signed)
OUTPATIENT PHYSICAL THERAPY THORACOLUMBAR PROGRESS NOTE Progress Note Reporting Period 03/07/2022 to 04/12/22  See note below for Objective Data and Assessment of Progress/Goals.        Patient Name: Jahniyah Revere MRN: [id number] DOB:1961/04/29, 61 y.o., female Today's Date: 04/12/2022   PT End of Session - 04/12/22 0948     Visit Number 5    Number of Visits 8    Date for PT Re-Evaluation 04/04/22    Authorization Type [employer]; no VL, no auth; $40.00 copay    Progress Note Due on Visit 8    PT Start Time 320 [id number] [id number]    PT Stop Time 1030    PT Time Calculation (min) 42 min    Activity Tolerance Patient tolerated treatment well    Behavior During Therapy WFL for tasks assessed/performed                Past Medical History:  Diagnosis Date   Allergy    seasonal   Anemia    Anxiety    Arthritis    knee right   Diabetes mellitus without complication ([place])    pt.denies,don't   GERD (gastroesophageal reflux disease)    Heart murmur    Hyperlipidemia    controlled   Hypertension    Thyroid disease    Past Surgical History:  Procedure Laterality Date   BREAST BIOPSY Right    COLONOSCOPY     KNEE ARTHROSCOPY WITH MEDIAL MENISECTOMY Right 01/13/2020   Procedure: KNEE ARTHROSCOPY WITH MEDIAL AND LATERAL  MENISCECTOMY;  Surgeon: Carole Civil, MD;  Location: [facility];  Service: Orthopedics;  Laterality: Right;   PARTIAL HYSTERECTOMY     abdominal   Patient Active Problem List   Diagnosis Date Noted   S/P arthroscopy of right knee 01/13/20  02/16/2020   Derangement of posterior horn of medial meniscus of right knee    Meniscus, lateral, derangement, right    Osteoarthritis of knee 07/19/2018   Pain in right knee 06/11/2018   Postablative hypothyroidism 04/22/2018   Hyperlipemia 01/13/2016   VITAMIN D DEFICIENCY 04/01/2008    PCP: Kathyrn Lass, MD  REFERRING PROVIDER: Carole Civil, MD [facility]   REFERRING DIAG:  M54.50 (ICD-10-CM) - Lumbar pain M54.32 (ICD-10-CM) - Sciatica, left side   Rationale for Evaluation and Treatment Rehabilitation  THERAPY DIAG:  Difficulty in walking, not elsewhere classified - Plan: PT plan of care cert/re-cert  Pain in left leg - Plan: PT plan of care cert/re-cert  Other abnormalities of gait and mobility - Plan: PT plan of care cert/re-cert  Acute left-sided low back pain with left-sided sciatica - Plan: PT plan of care cert/re-cert  ONSET DATE: about 3 weeks ago  SUBJECTIVE:  SUBJECTIVE STATEMENT: "My back doesn't hurt anymore" but still has some aching in her knee; 1-2/10  Eval:Lifts her grandson's (twins) in car seat weighs maybe 30 lbs total;  was about 3 weeks ago took them to the doctor. Had to navigate several steps. Pain started in the back and then started down the back of the leg and lateral leg down to the ankle.  Saw Dr. Aline Brochure; did some x-ray's noted degenerative changes in low back and knees.  Left knee felt like it was going to buckle some. PERTINENT HISTORY:  Bilateral knee arthritis Right > left Right knee surgery 2 years ago  PAIN:  Are you having pain? Yes: NPRS scale: 1-2/10 Pain location:  left knee Pain description: radiating, aching and sore Aggravating factors: sitting Relieving factors: standing and walking   PRECAUTIONS: None  WEIGHT BEARING RESTRICTIONS No  FALLS:  Has patient fallen in last 6 months? 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; superwoman  PLOF: Independent  PATIENT GOALS be able to walk better on left leg  without it hurting   OBJECTIVE:   DIAGNOSTIC FINDINGS:  Left hip pain possible back pain   Lumbar spine 2-3 views   Increased lumbar lordosis just a slight abnormality on the [facility] x-ray in terms of alignment   Bony sclerosis and joint space narrowing is seen at L3-4, L4-5, L5-S1   Impression spondylosis without spondylolysis or listhesis lumbar spine mild to moderate  Left knee pain   Left leg pain   3 views left knee   Alignment is still valgus as is the right knee which is seen only on the [facility] image   Peaking of the tibial spine slight narrowing of the medial compartment is noted patellofemoral joint has osteophytes superiorly and inferiorly   Grade 2 arthritis of the left knee with probable grade 3 arthritis on the right knee although further imaging needed    PATIENT SURVEYS:  FOTO 50   COGNITION:  Overall cognitive status: Within functional limits for tasks assessed     SENSATION: Feeling of numbness in left leg    POSTURE: rounded shoulders, forward head, and increased lumbar lordosis  PALPATION: Tender mid lumbar spine paraspinals.  LUMBAR ROM:   Active  AROM  eval AROM 04/12/22  Flexion 80% " pulling"   Extension 50% available   Right lateral flexion 75% available   Left lateral flexion 70% available   Right rotation    Left rotation     (Blank rows = not tested)    LOWER EXTREMITY MMT:    MMT Right eval Left eval Right 04/12/22 Left 04/12/22  Hip flexion 4+ 4- 5 5  Hip extension 4 4-* 4+ 4+  Hip abduction      Hip adduction      Hip internal rotation      Hip external rotation      Knee flexion 4+ 4 4+ 4+  Knee extension 5 4+ *pulling* 5 5  Ankle dorsiflexion _0 Ankle plantarflexion      Ankle inversion      Ankle eversion       (Blank rows = not tested)   FUNCTIONAL TESTS:  30 seconds chair stand test x 2  GAIT: Distance walked: 56 Assistive device utilized: None Level of  assistance: SBA Comments: antalgic gait;  decreased stance left leg    TODAY'S TREATMENT  04/12/22 Progress note FOTO 94 MMT's see above 30 sec sit to stand x 13 reps  Prone: Prone press ups x 10 Prone glute sets 5" x 10  Supine: Quad set 5" hold x 10 SAQ's 2 x 10 2#'s SLR 2 x 5 Clam 2 x 10  Standing: Slant board 5 x 20"  Ambulation x 250 ft without assistive device     03/28/22 Prone: Prone lying with moist heat on back x 5' Prone press ups x 10 Prone glute sets 5" hold x 10  Supine  Bridge with hip abduction with belt 5" hold 2 x 10 Quad set 5" hold x 10 SAQ's 2 x 10 SLR 2 x 5  Sit to stand 2 x 10 4" step ups 2 x 10 Slant board 5 x 20"  Standing  H/S stretch  5 x 20" Standing lumbar extension over raised bed x 10   03/13/22 Prone: Prone lying with moist heat on back x 5' Prone press ups x 10 Manual grade 2 mobs x 10  Supine: TA bracing 5" hold x 10 TA bracing with march x 10 TA bracing with hip adduction ball adduction 5" x 10 TA bracing with hip Abduction with belt 5" x 10 Left active hamstring stretch with nerve floss 2 x 10  Standing lumbar extension over counter x 10  Log roll education     03/08/22 Review of HEP and goals Prone: Prone lying x 5' with moist heat Prone press up 2 x 10 Prone press up with overpressure x 8 Manual grade 2 mobs L3-4 area x 10  Supine transverse abdominus 5" hold x 10  Standing lumbar extensions x 10  Physical therapy evaluation and HEP instruction   PATIENT EDUCATION:  Education details: Patient educated on exam findings, POC, scope of PT, HEP. Person educated: Patient Education method: Explanation, Demonstration, and Handouts Education comprehension: verbalized understanding, returned demonstration, verbal cues required, and tactile cues required    HOME EXERCISE PROGRAM: 03/28/22 Quad set, SAQs, SLR 03/13/22  TA abdominis with marching, hip ABD and adduction 03/08/22 Standing lumbar extensions over counter x 10  Access Code:  [email or web address] URL: [email or web address] Date: 03/07/2022 Prepared by: [facility]  Exercises - Supine Transversus Abdominis Bracing - Hands on Stomach  - 2 x daily - 7 x weekly - 1 sets - 10 reps - Prone Press Up  - 5 x daily - 7 x weekly - 1 sets - 10 reps - Seated Correct Posture  - 1 x daily - 7 x weekly - 3 sets - 10 reps  ASSESSMENT:  CLINICAL IMPRESSION: Progress note today.  Good improvement with strength and FOTO.  Has met goals for her back; still having some issues with her left knee with pain and stiffness.  Patient will benefit from continued therapy for Left knee impairment.     OBJECTIVE IMPAIRMENTS Abnormal gait, decreased activity tolerance, decreased balance, decreased endurance, decreased knowledge of condition, decreased mobility, difficulty walking, decreased ROM, decreased strength, hypomobility, increased fascial restrictions, impaired perceived functional ability, increased muscle spasms, impaired flexibility, impaired sensation, postural dysfunction, and pain.   ACTIVITY LIMITATIONS carrying, lifting, bending, sitting, standing, squatting, sleeping, stairs, transfers, locomotion level, and caring for others  PARTICIPATION LIMITATIONS: meal prep, cleaning, laundry, shopping, community activity, occupation, and yard work   [employer] POTENTIAL: Good  CLINICAL DECISION MAKING: Stable/uncomplicated  EVALUATION COMPLEXITY: Low   GOALS: Goals reviewed  with patient? Yes  SHORT TERM GOALS: Target date: 03/21/2022  patient will be independent with initial HEP   Baseline: Goal status: MET  2.  Patient will report at least 50% improvement in overall symptoms and/or function to demonstrate improved functional mobility  Baseline:  Goal status: MET   LONG TERM GOALS: Target date: 04/04/2022   Patient will be independent with advanced HEP and self management strategies to improve quality of life and functional outcomes.  Baseline:  Goal status:  MET  2.  Patient will improve FOTO score to predicted value to demonstrate improved functional mobility  Baseline: 50 Goal status: MET  3.  Patient will improve 30 sec STS score from 2 reps to 5 reps to demonstrate improved functional mobility and increased lower extremity strength.  Baseline: 13 reps Goal status: MET  4.   Patient will increase all tested lower extremity MMTs to 4+-5/5 to promote return to ambulation community distances with minimal deviation.  Baseline:  Goal status: MET  5.  Patient will ambulate x 226 ft without gait deviation to navigate household and community distances more efficiently. Baseline:  Goal status: MET  PLAN: PT FREQUENCY: 2x/week  PT DURATION: 4 weeks  PLANNED INTERVENTIONS: Therapeutic exercises, Therapeutic activity, Neuromuscular re-education, Balance training, Gait training, Patient/Family education, Joint manipulation, Joint mobilization, Stair training, Orthotic/Fit training, DME instructions, Aquatic Therapy, Dry Needling, Electrical stimulation, Spinal manipulation, Spinal mobilization, Cryotherapy, Moist heat, Compression bandaging, scar mobilization, Splintting, Taping, Traction, Ultrasound, Ionotophoresis 63m/ml Dexamethasone, and Manual therapy   PLAN FOR NEXT SESSION:  patient sees MD 11/13; patient would benefit from continued therapy for the left knee but will await MD recommendation.      10:35 AM, 04/12/22 Anden Bartolo Small Torrey Ballinas MPT [place] [facility] [place] #[phone]

## 2022-04-17 ENCOUNTER — Ambulatory Visit (INDEPENDENT_AMBULATORY_CARE_PROVIDER_SITE_OTHER): Payer: No Typology Code available for payment source | Admitting: Orthopedic Surgery

## 2022-04-17 ENCOUNTER — Encounter: Payer: Self-pay | Admitting: Orthopedic Surgery

## 2022-04-17 DIAGNOSIS — G8929 Other chronic pain: Secondary | ICD-10-CM | POA: Diagnosis not present

## 2022-04-17 DIAGNOSIS — M5432 Sciatica, left side: Secondary | ICD-10-CM

## 2022-04-17 DIAGNOSIS — M23322 Other meniscus derangements, posterior horn of medial meniscus, left knee: Secondary | ICD-10-CM | POA: Diagnosis not present

## 2022-04-17 DIAGNOSIS — M25562 Pain in left knee: Secondary | ICD-10-CM | POA: Diagnosis not present

## 2022-04-17 NOTE — Patient Instructions (Signed)
While we are working on your approval for MRI please go ahead and call to schedule your appointment with [facility] within at least one (1) week.   Central Scheduling [phone]  

## 2022-04-17 NOTE — Progress Notes (Signed)
Chief Complaint  Patient presents with   Back Pain    Back feels okay but has left knee pain    61 year old female with persistent pain in her left knee.  She has had lumbar spine issues as well but did well with physical therapy progressing well there.  Physical Exam Musculoskeletal:     Left knee: Effusion, bony tenderness and crepitus present. No swelling, deformity, erythema, ecchymosis or lacerations. Decreased range of motion. Tenderness present over the medial joint line. No LCL laxity, MCL laxity, ACL laxity or PCL laxity.Abnormal meniscus. Normal alignment and normal patellar mobility. Normal pulse.     Instability Tests: Anterior drawer test negative. Posterior drawer test negative. Medial McMurray test positive.     Encounter Diagnoses  Name Primary?   Chronic pain of left knee    Sciatica, left side    Derangement of posterior horn of medial meniscus of left knee Yes    Pain left knee recommend MRI  Sciatica seems to be improving continue home exercises  I will see the patient after the MRI is completed

## 2022-04-18 ENCOUNTER — Encounter: Payer: Self-pay | Admitting: Hematology

## 2022-04-18 ENCOUNTER — Encounter: Payer: Self-pay | Admitting: Gastroenterology

## 2022-04-20 ENCOUNTER — Ambulatory Visit (HOSPITAL_COMMUNITY)
Admission: RE | Admit: 2022-04-20 | Discharge: 2022-04-20 | Disposition: A | Discharge status: Home / Self Care | Payer: No Typology Code available for payment source | Source: Ambulatory Visit | Attending: Orthopedic Surgery | Admitting: Orthopedic Surgery

## 2022-04-20 DIAGNOSIS — M25562 Pain in left knee: Secondary | ICD-10-CM | POA: Diagnosis present

## 2022-04-20 DIAGNOSIS — G8929 Other chronic pain: Secondary | ICD-10-CM | POA: Insufficient documentation

## 2022-04-24 ENCOUNTER — Ambulatory Visit (AMBULATORY_SURGERY_CENTER): Payer: No Typology Code available for payment source | Admitting: Internal Medicine

## 2022-04-24 ENCOUNTER — Encounter: Payer: Self-pay | Admitting: Internal Medicine

## 2022-04-24 ENCOUNTER — Encounter: Payer: No Typology Code available for payment source | Admitting: Gastroenterology

## 2022-04-24 VITALS — BP 109/75 | HR 78 | Temp 97.5°F | Resp 10 | Ht 64.0 in | Wt 212.0 lb

## 2022-04-24 DIAGNOSIS — D122 Benign neoplasm of ascending colon: Secondary | ICD-10-CM | POA: Diagnosis not present

## 2022-04-24 DIAGNOSIS — Z1212 Encounter for screening for malignant neoplasm of rectum: Secondary | ICD-10-CM

## 2022-04-24 DIAGNOSIS — D125 Benign neoplasm of sigmoid colon: Secondary | ICD-10-CM | POA: Diagnosis not present

## 2022-04-24 DIAGNOSIS — K635 Polyp of colon: Secondary | ICD-10-CM

## 2022-04-24 DIAGNOSIS — Z1211 Encounter for screening for malignant neoplasm of colon: Secondary | ICD-10-CM | POA: Diagnosis present

## 2022-04-24 MED ORDER — SODIUM CHLORIDE 0.9 % IV SOLN: 500.0000 mL | Freq: Once | INTRAVENOUS | Status: DC

## 2022-04-24 NOTE — Progress Notes (Signed)
Pt's states no medical or surgical changes since previsit or office visit. 

## 2022-04-24 NOTE — Op Note (Signed)
[place] Patient Name: Jodi Wade Procedure Date: 04/24/2022 11:10 AM MRN: [id number] Endoscopist: Georgian Co , , [id number] Age: 61 Referring MD:  Date of Birth: February 27, 1961 Gender: Female Account #: [id number] Procedure:                Colonoscopy Indications:              Screening for colorectal malignant neoplasm Medicines:                Monitored Anesthesia Care Procedure:                Pre-Anesthesia Assessment:                           - Prior to the procedure, a History and Physical                            was performed, and patient medications and                            allergies were reviewed. The patient's tolerance of                            previous anesthesia was also reviewed. The risks                            and benefits of the procedure and the sedation                            options and risks were discussed with the patient.                            All questions were answered, and informed consent                            was obtained. Prior Anticoagulants: The patient has                            taken no anticoagulant or antiplatelet agents. ASA                            Grade Assessment: II - A patient with mild systemic                            disease. After reviewing the risks and benefits,                            the patient was deemed in satisfactory condition to                            undergo the procedure.                           After obtaining informed consent, the colonoscope  was passed under direct vision. Throughout the                            procedure, the patient's blood pressure, pulse, and                            oxygen saturations were monitored continuously. The                            CF [id number] #[id number] was introduced through the anus                            and advanced to the the terminal ileum. The                            colonoscopy  was performed without difficulty. The                            patient tolerated the procedure well. The quality                            of the bowel preparation was good. The terminal                            ileum, ileocecal valve, appendiceal orifice, and                            rectum were photographed. Scope In: 11:12:48 AM Scope Out: 11:27:06 AM Scope Withdrawal Time: 0 hours 11 minutes 28 seconds  Total Procedure Duration: 0 hours 14 minutes 18 seconds  Findings:                 The terminal ileum appeared normal.                           Two sessile polyps were found in the ascending                            colon. The polyps were 3 to 5 mm in size. These                            polyps were removed with a cold snare. Resection                            and retrieval were complete.                           Multiple diverticula were found in the sigmoid                            colon and descending colon.                           Three sessile polyps were found in the sigmoid  colon. The polyps were 3 to 6 mm in size. These                            polyps were removed with a cold snare. Resection                            and retrieval were complete.                           Non-bleeding internal hemorrhoids were found during                            retroflexion. Complications:            No immediate complications. Estimated Blood Loss:     Estimated blood loss was minimal. Impression:               - The examined portion of the ileum was normal.                           - Two 3 to 5 mm polyps in the ascending colon,                            removed with a cold snare. Resected and retrieved.                           - Diverticulosis in the sigmoid colon and in the                            descending colon.                           - Three 3 to 6 mm polyps in the sigmoid colon,                            removed with a  cold snare. Resected and retrieved.                           - Non-bleeding internal hemorrhoids. Recommendation:           - Discharge patient to home (with escort).                           - Await pathology results.                           - The findings and recommendations were discussed                            with the patient. Dr Georgian Co "Lyndee Leo" Lorenso Courier,  04/24/2022 11:31:36 AM

## 2022-04-24 NOTE — Progress Notes (Signed)
Report to PACU, RN, vss, BBS= Clear.  

## 2022-04-24 NOTE — Patient Instructions (Addendum)
-   5 polyps removed and sent to pathology.  Await pathology results. - The findings and recommendations were discussed with the patient ( diverticulosis, polyps and hemorrhoids) - resume previous medications and diet  YOU HAD AN ENDOSCOPIC PROCEDURE TODAY AT [place]:   Refer to the procedure report that was given to you for any specific questions about what was found during the examination.  If the procedure report does not answer your questions, please call your gastroenterologist to clarify.  If you requested that your care partner not be given the details of your procedure findings, then the procedure report has been included in a sealed envelope for you to review at your convenience later.  YOU SHOULD EXPECT: Some feelings of bloating in the abdomen. Passage of more gas than usual.  Walking can help get rid of the air that was put into your GI tract during the procedure and reduce the bloating. If you had a lower endoscopy (such as a colonoscopy or flexible sigmoidoscopy) you may notice spotting of blood in your stool or on the toilet paper. If you underwent a bowel prep for your procedure, you may not have a normal bowel movement for a few days.  Please Note:  You might notice some irritation and congestion in your nose or some drainage.  This is from the oxygen used during your procedure.  There is no need for concern and it should clear up in a day or so.  SYMPTOMS TO REPORT IMMEDIATELY:  Following lower endoscopy (colonoscopy or flexible sigmoidoscopy):  Excessive amounts of blood in the stool  Significant tenderness or worsening of abdominal pains  Swelling of the abdomen that is new, acute  Fever of 100F or higher   For urgent or emergent issues, a gastroenterologist can be reached at any hour by calling [phone]. Do not use MyChart messaging for urgent concerns.    DIET:  We do recommend a small meal at first, but then you may proceed to your regular diet.   Drink plenty of fluids but you should avoid alcoholic beverages for 24 hours.  ACTIVITY:  You should plan to take it easy for the rest of today and you should NOT DRIVE or use heavy machinery until tomorrow (because of the sedation medicines used during the test).    FOLLOW UP: Our staff will call the number listed on your records the next business day following your procedure.  We will call around 7:15- 8:00 am to check on you and address any questions or concerns that you may have regarding the information given to you following your procedure. If we do not reach you, we will leave a message.     If any biopsies were taken you will be contacted by phone or by letter within the next 1-3 weeks.  Please call us at [phone] if you have not heard about the biopsies in 3 weeks.    SIGNATURES/CONFIDENTIALITY: You and/or your care partner have signed paperwork which will be entered into your electronic medical record.  These signatures attest to the fact that that the information above on your After Visit Summary has been reviewed and is understood.  Full responsibility of the confidentiality of this discharge information lies with you and/or your care-partner.

## 2022-04-24 NOTE — Progress Notes (Signed)
GASTROENTEROLOGY PROCEDURE H&P NOTE   Primary Care Physician: Kathyrn Lass, MD    Reason for Procedure:   Colon cancer screening  Plan:    Colonoscopy  Patient is appropriate for endoscopic procedure(s) in the ambulatory ([facility]) setting.  The nature of the procedure, as well as the risks, benefits, and alternatives were carefully and thoroughly reviewed with the patient. Ample time for discussion and questions allowed. The patient understood, was satisfied, and agreed to proceed.     HPI: Jodi Wade is a 61 y.o. female who presents for colonoscopy for colon cancer screening. Denies blood in stools, changes in bowel habits, or unintentional weight loss. Denies family history of colon cancer.  Past Medical History:  Diagnosis Date   Allergy    seasonal   Anemia    Anxiety    Arthritis    knee right   Diabetes mellitus without complication (HCC)    pt.denies,don't   GERD (gastroesophageal reflux disease)    Heart murmur    Hyperlipidemia    controlled   Hypertension    Thyroid disease     Past Surgical History:  Procedure Laterality Date   BREAST BIOPSY Right    COLONOSCOPY     KNEE ARTHROSCOPY WITH MEDIAL MENISECTOMY Right 01/13/2020   Procedure: KNEE ARTHROSCOPY WITH MEDIAL AND LATERAL  MENISCECTOMY;  Surgeon: Carole Civil, MD;  Location: [facility];  Service: Orthopedics;  Laterality: Right;   PARTIAL HYSTERECTOMY     abdominal    Prior to Admission medications   Medication Sig Start Date End Date Taking? Authorizing Provider  amLODipine (NORVASC) 10 MG tablet TAKE 1 TABLET BY MOUTH ONCE DAILY 09/12/19 09/11/20  Livengood, Marva Panda, PA-C  amLODipine (NORVASC) 10 MG tablet Take 1 tablet (10 mg total) by mouth daily. 12/14/21     atorvastatin (LIPITOR) 20 MG tablet Take 20 mg by mouth daily. 06/20/19   [provider]  atorvastatin (LIPITOR) 20 MG tablet TAKE 1 TABLET BY MOUTH ONCE DAILY 09/12/19 09/11/20  Livengood, Marva Panda, PA-C  gabapentin  (NEURONTIN) 300 MG capsule Take 1 capsule (300 mg total) by mouth 3 (three) times daily. Patient taking differently: Take 300 mg by mouth as needed. 03/06/22   Carole Civil, MD  ibuprofen (ADVIL) 800 MG tablet Take 1 tablet (800 mg total) by mouth every 8 (eight) hours as needed. 03/24/22   Carole Civil, MD  levothyroxine (SYNTHROID) 88 MCG tablet Take 1 tablet (88 mcg total) by mouth daily. 12/14/21   Elayne Snare, MD  lisinopril (ZESTRIL) 20 MG tablet TAKE 1 TABLET BY MOUTH ONCE DAILY 09/12/19 09/11/20  Livengood, Marva Panda, PA-C  lisinopril (ZESTRIL) 40 MG tablet Take 1 tablet (40 mg total) by mouth daily. 05/27/21     Multiple Vitamin (MULTIVITAMIN ADULT PO) Take by mouth daily.    [provider]  tiZANidine (ZANAFLEX) 4 MG tablet Take 1 tablet (4 mg total) by mouth daily. Patient taking differently: Take 4 mg by mouth as needed. 03/06/22   Carole Civil, MD    Current Outpatient Medications  Medication Sig Dispense Refill   amLODipine (NORVASC) 10 MG tablet TAKE 1 TABLET BY MOUTH ONCE DAILY 90 tablet 3   amLODipine (NORVASC) 10 MG tablet Take 1 tablet (10 mg total) by mouth daily. 90 tablet 3   atorvastatin (LIPITOR) 20 MG tablet Take 20 mg by mouth daily.     atorvastatin (LIPITOR) 20 MG tablet TAKE 1 TABLET BY MOUTH ONCE DAILY 90 tablet 3  gabapentin (NEURONTIN) 300 MG capsule Take 1 capsule (300 mg total) by mouth 3 (three) times daily. (Patient taking differently: Take 300 mg by mouth as needed.) 90 capsule 5   ibuprofen (ADVIL) 800 MG tablet Take 1 tablet (800 mg total) by mouth every 8 (eight) hours as needed. 90 tablet 1   levothyroxine (SYNTHROID) 88 MCG tablet Take 1 tablet (88 mcg total) by mouth daily. 90 tablet 1   lisinopril (ZESTRIL) 20 MG tablet TAKE 1 TABLET BY MOUTH ONCE DAILY 90 tablet 3   lisinopril (ZESTRIL) 40 MG tablet Take 1 tablet (40 mg total) by mouth daily. 90 tablet 3   Multiple Vitamin (MULTIVITAMIN ADULT PO) Take by mouth daily.      tiZANidine (ZANAFLEX) 4 MG tablet Take 1 tablet (4 mg total) by mouth daily. (Patient taking differently: Take 4 mg by mouth as needed.) 30 tablet 1   No current facility-administered medications for this visit.    Allergies as of 04/24/2022 - Review Complete 04/24/2022  Allergen Reaction Noted   Latex Hives 05/10/2015   Fexofenadine-pseudoephed er     Hydrochlorothiazide     Pseudoephedrine      Family History  Problem Relation Age of Onset   Cancer Mother    Breast cancer Mother    Thyroid disease Mother    Thyroid disease Maternal Aunt    Thyroid disease Son        Hyperthyroid, not treated   Diabetes Other    Hypertension Other    Colon cancer Neg Hx    Colon polyps Neg Hx    Crohn's disease Neg Hx    Esophageal cancer Neg Hx    Rectal cancer Neg Hx    Stomach cancer Neg Hx    Ulcerative colitis Neg Hx     Social History   Socioeconomic History   Marital status: Married    Spouse name: Not on file   Number of children: Not on file   Years of education: Not on file   Highest education level: Not on file  Occupational History   Not on file  Tobacco Use   Smoking status: Never    Passive exposure: Current (husband smokes)   Smokeless tobacco: Never  Vaping Use   Vaping Use: Never used  Substance and Sexual Activity   Alcohol use: No   Drug use: No   Sexual activity: Not on file  Other Topics Concern   Not on file  Social History Narrative   Not on file   Social Determinants of Health   Financial Resource Strain: Not on file  Food Insecurity: Not on file  Transportation Needs: Not on file  Physical Activity: Not on file  Stress: Not on file  Social Connections: Not on file  Intimate Partner Violence: Not on file    Physical Exam: Vital signs in last 24 hours: BP (!) 148/101 (BP Location: Right Arm) Comment: rechecked bp left arm=160 /104  Pulse 93   Temp (!) 97.5 F (36.4 C)   Ht '5\' 4"'$  (1.626 m)   Wt 212 lb (96.2 kg)   SpO2 98%   BMI 36.39  kg/m  GEN: NAD EYE: Sclerae anicteric ENT: MMM CV: Non-tachycardic Pulm: No increased work of breathing GI: Soft, NT/ND NEURO:  Alert & Oriented   Christia Reading, MD [facility]  04/24/2022 10:32 AM

## 2022-04-25 ENCOUNTER — Telehealth: Payer: Self-pay

## 2022-04-25 NOTE — Telephone Encounter (Signed)
Post procedure follow up call, no answer 

## 2022-05-01 ENCOUNTER — Ambulatory Visit (INDEPENDENT_AMBULATORY_CARE_PROVIDER_SITE_OTHER): Payer: No Typology Code available for payment source | Admitting: Orthopedic Surgery

## 2022-05-01 ENCOUNTER — Encounter: Payer: Self-pay | Admitting: Orthopedic Surgery

## 2022-05-01 DIAGNOSIS — M1712 Unilateral primary osteoarthritis, left knee: Secondary | ICD-10-CM

## 2022-05-01 DIAGNOSIS — M23322 Other meniscus derangements, posterior horn of medial meniscus, left knee: Secondary | ICD-10-CM | POA: Diagnosis not present

## 2022-05-01 DIAGNOSIS — G8929 Other chronic pain: Secondary | ICD-10-CM

## 2022-05-01 DIAGNOSIS — M5432 Sciatica, left side: Secondary | ICD-10-CM | POA: Diagnosis not present

## 2022-05-01 NOTE — Progress Notes (Signed)
Chief Complaint  Patient presents with   Knee Pain    LT knee MRI review   Encounter Diagnoses  Name Primary?   Chronic pain of left knee Yes   Sciatica, left side    Derangement of posterior horn of medial meniscus of left knee    Primary osteoarthritis of left knee     I was able to read Jodi Wade's MRI she has 3 compartment arthrosis high-grade cartilage loss medially and extrusion and tearing of the posterior horn of the medial meniscus  Her primary complaint however is pain she did well with the gabapentin  She is not having mechanical symptoms therefore I will see her in 6 weeks and we can determine if she should proceed with arthroscopic surgery  She will continue gabapentin

## 2022-05-08 ENCOUNTER — Ambulatory Visit: Payer: No Typology Code available for payment source | Admitting: Orthopedic Surgery

## 2022-05-12 ENCOUNTER — Encounter: Payer: Self-pay | Admitting: Internal Medicine

## 2022-05-30 ENCOUNTER — Other Ambulatory Visit (HOSPITAL_COMMUNITY): Payer: Self-pay

## 2022-05-30 ENCOUNTER — Other Ambulatory Visit: Payer: Self-pay | Admitting: Endocrinology

## 2022-05-30 MED ORDER — ATORVASTATIN CALCIUM 40 MG PO TABS
40.0000 mg | ORAL_TABLET | Freq: Every day | ORAL | 1 refills | Status: DC
  Filled 2022-05-30: qty 90, 90d supply, fill #0
  Filled 2022-08-23: qty 90, 90d supply, fill #1
  Filled 2022-08-29: qty 30, 30d supply, fill #0

## 2022-05-30 MED ORDER — LISINOPRIL 40 MG PO TABS
40.0000 mg | ORAL_TABLET | Freq: Every day | ORAL | 1 refills | Status: DC
  Filled 2022-05-30: qty 90, 90d supply, fill #0
  Filled 2022-08-28: qty 30, 30d supply, fill #1
  Filled 2022-08-29 – 2022-09-20 (×2): qty 30, 30d supply, fill #0
  Filled 2022-10-26 (×2): qty 30, 30d supply, fill #1

## 2022-05-31 ENCOUNTER — Other Ambulatory Visit (HOSPITAL_COMMUNITY): Payer: Self-pay

## 2022-05-31 ENCOUNTER — Other Ambulatory Visit: Payer: Self-pay

## 2022-05-31 MED ORDER — LEVOTHYROXINE SODIUM 88 MCG PO TABS
88.0000 ug | ORAL_TABLET | Freq: Every day | ORAL | 1 refills | Status: DC
  Filled 2022-05-31: qty 90, 90d supply, fill #0
  Filled 2022-08-28: qty 30, 30d supply, fill #1
  Filled 2022-08-29: qty 30, 30d supply, fill #0
  Filled 2022-10-09: qty 30, 30d supply, fill #1
  Filled 2022-11-10 (×2): qty 30, 30d supply, fill #2

## 2022-06-12 ENCOUNTER — Other Ambulatory Visit: Payer: Self-pay

## 2022-06-12 ENCOUNTER — Emergency Department (HOSPITAL_COMMUNITY)
Admission: EM | Admit: 2022-06-12 | Discharge: 2022-06-12 | Disposition: A | Discharge status: Home / Self Care | Payer: 59 | Attending: Emergency Medicine | Admitting: Emergency Medicine

## 2022-06-12 ENCOUNTER — Encounter (HOSPITAL_COMMUNITY): Payer: Self-pay

## 2022-06-12 ENCOUNTER — Ambulatory Visit: Payer: Self-pay | Admitting: Orthopedic Surgery

## 2022-06-12 DIAGNOSIS — E119 Type 2 diabetes mellitus without complications: Secondary | ICD-10-CM | POA: Diagnosis not present

## 2022-06-12 DIAGNOSIS — I10 Essential (primary) hypertension: Secondary | ICD-10-CM | POA: Diagnosis not present

## 2022-06-12 DIAGNOSIS — H6121 Impacted cerumen, right ear: Secondary | ICD-10-CM | POA: Diagnosis not present

## 2022-06-12 DIAGNOSIS — H9201 Otalgia, right ear: Secondary | ICD-10-CM | POA: Diagnosis present

## 2022-06-12 MED ORDER — CARBAMIDE PEROXIDE 6.5 % OT SOLN: 5.0000 [drp] | Freq: Two times a day (BID) | OTIC | 0 refills | Status: DC

## 2022-06-12 NOTE — ED Provider Notes (Signed)
[facility] Emergency Department Provider Note MRN:  [id number]  Arrival date & time: 06/12/22     Chief Complaint   Otalgia   History of Present Illness   Jodi Wade is a 62 y.o. year-old female with a history of diabetes presenting to the ED with chief complaint of otalgia.  Sensation of irritation and fullness to the right ear.  She was told she has a lot of wax in her right ear.  She wants someone to clean it for her.  No other symptoms.  Review of Systems  A thorough review of systems was obtained and all systems are negative except as noted in the HPI and PMH.   Patient's Health History    Past Medical History:  Diagnosis Date   Allergy    seasonal   Anemia    Anxiety    Arthritis    knee right   Diabetes mellitus without complication (HCC)    pt.denies,don't   GERD (gastroesophageal reflux disease)    Heart murmur    Hyperlipidemia    controlled   Hypertension    Thyroid disease     Past Surgical History:  Procedure Laterality Date   BREAST BIOPSY Right    COLONOSCOPY     KNEE ARTHROSCOPY WITH MEDIAL MENISECTOMY Right 01/13/2020   Procedure: KNEE ARTHROSCOPY WITH MEDIAL AND LATERAL  MENISCECTOMY;  Surgeon: Carole Civil, MD;  Location: [facility];  Service: Orthopedics;  Laterality: Right;   PARTIAL HYSTERECTOMY     abdominal    Family History  Problem Relation Age of Onset   Cancer Mother    Breast cancer Mother    Thyroid disease Mother    Thyroid disease Maternal Aunt    Thyroid disease Son        Hyperthyroid, not treated   Diabetes Other    Hypertension Other    Colon cancer Neg Hx    Colon polyps Neg Hx    Crohn's disease Neg Hx    Esophageal cancer Neg Hx    Rectal cancer Neg Hx    Stomach cancer Neg Hx    Ulcerative colitis Neg Hx     Social History   Socioeconomic History   Marital status: Married    Spouse name: Not on file   Number of children: Not on file   Years of education: Not on file    Highest education level: Not on file  Occupational History   Not on file  Tobacco Use   Smoking status: Never    Passive exposure: Current (husband smokes)   Smokeless tobacco: Never  Vaping Use   Vaping Use: Never used  Substance and Sexual Activity   Alcohol use: No   Drug use: No   Sexual activity: Not Currently  Other Topics Concern   Not on file  Social History Narrative   Not on file   Social Determinants of Health   Financial Resource Strain: Not on file  Food Insecurity: Not on file  Transportation Needs: Not on file  Physical Activity: Not on file  Stress: Not on file  Social Connections: Not on file  Intimate Partner Violence: Not on file     Physical Exam   Vitals:   06/12/22 0313  BP: (!) 151/99  Pulse: (!) 104  Resp: 18  Temp: 98.2 F (36.8 C)  SpO2: 100%    CONSTITUTIONAL: Well-appearing, NAD NEURO/PSYCH:  Alert and oriented x 3, no focal deficits EYES:  eyes equal and reactive ENT/NECK:  no LAD, no JVD CARDIO: Regular rate, well-perfused, normal S1 and S2 PULM:  CTAB no wheezing or rhonchi GI/GU:  non-distended, non-tender MSK/SPINE:  No gross deformities, no edema SKIN:  no rash, atraumatic   *Additional and/or pertinent findings included in MDM below  Diagnostic and Interventional Summary    EKG Interpretation  Date/Time:    Ventricular Rate:    PR Interval:    QRS Duration:   QT Interval:    QTC Calculation:   R Axis:     Text Interpretation:         Labs Reviewed - No data to display  No orders to display    Medications - No data to display   Procedures  /  Critical Care Procedures  ED Course and Medical Decision Making  Initial Impression and Ddx Cerumen impaction on the right, no emergent process, patient educated on how to remove earwax.  Past medical/surgical history that increases complexity of ED encounter: Diabetes  Interpretation of Diagnostics Laboratory and/or imaging options to aid in the diagnosis/care  of the patient were considered.  After careful history and physical examination, it was determined that there was no indication for diagnostics at this time.  Patient Reassessment and Ultimate Disposition/Management     Discharge  Patient management required discussion with the following services or consulting groups:  None  Complexity of Problems Addressed Acute complicated illness or Injury  Additional Data Reviewed and Analyzed Further history obtained from: None  Additional Factors Impacting ED Encounter Risk None  Barth Kirks. [place] Small, [place] [email or web address]  Final Clinical Impressions(s) / ED Diagnoses     ICD-10-CM   1. Impacted cerumen of right ear  H61.21       ED Discharge Orders     None        Discharge Instructions Discussed with and Provided to Patient:    Discharge Instructions      You were evaluated in the Emergency Department and after careful evaluation, we did not find any emergent condition requiring admission or further testing in the hospital.  Your exam/testing today was overall reassuring.  Symptoms seem to be due to clogged earwax in your ear canal.  Use a mixture of hydrogen peroxide and water as we discussed to get the wax out.  Please return to the Emergency Department if you experience any worsening of your condition.  Thank you for allowing [place] to be a part of your care.       Maudie Flakes, MD 06/12/22 [id number]

## 2022-06-12 NOTE — ED Triage Notes (Signed)
Pt arrived from home via POV c/o right otalgia X 2 weeks. Pt report sseeing her PCP and being prescribed Amoxicillin, and having 1 day left of medicine, but is not having relief. Pt endorses dizziness. Pt reports being told there was a build-up of wax in her right ear but they did not help her clean her ear.

## 2022-06-12 NOTE — ED Notes (Signed)
Went over dc papers. All questions answered. Ambulatory to lobby .  

## 2022-06-12 NOTE — Discharge Instructions (Signed)
You were evaluated in the Emergency Department and after careful evaluation, we did not find any emergent condition requiring admission or further testing in the hospital.  Your exam/testing today was overall reassuring.  Symptoms seem to be due to clogged earwax in your ear canal.  Use a mixture of hydrogen peroxide and water as we discussed to get the wax out.  Please return to the Emergency Department if you experience any worsening of your condition.  Thank you for allowing [place] to be a part of your care.

## 2022-07-03 ENCOUNTER — Ambulatory Visit: Payer: Self-pay | Admitting: Orthopedic Surgery

## 2022-07-06 DIAGNOSIS — Z419 Encounter for procedure for purposes other than remedying health state, unspecified: Secondary | ICD-10-CM | POA: Diagnosis not present

## 2022-07-10 ENCOUNTER — Ambulatory Visit: Payer: 59 | Admitting: Family Medicine

## 2022-07-13 DIAGNOSIS — Z6836 Body mass index (BMI) 36.0-36.9, adult: Secondary | ICD-10-CM | POA: Diagnosis not present

## 2022-07-13 DIAGNOSIS — R0981 Nasal congestion: Secondary | ICD-10-CM | POA: Diagnosis not present

## 2022-07-15 ENCOUNTER — Emergency Department (HOSPITAL_COMMUNITY)
Admission: EM | Admit: 2022-07-15 | Discharge: 2022-07-15 | Disposition: A | Discharge status: Home / Self Care | Payer: 59 | Attending: Emergency Medicine | Admitting: Emergency Medicine

## 2022-07-15 DIAGNOSIS — Z79899 Other long term (current) drug therapy: Secondary | ICD-10-CM | POA: Diagnosis not present

## 2022-07-15 DIAGNOSIS — Z91040 Latex allergy status: Secondary | ICD-10-CM | POA: Diagnosis not present

## 2022-07-15 DIAGNOSIS — I10 Essential (primary) hypertension: Secondary | ICD-10-CM | POA: Diagnosis not present

## 2022-07-15 DIAGNOSIS — R0981 Nasal congestion: Secondary | ICD-10-CM | POA: Insufficient documentation

## 2022-07-15 MED ORDER — MONTELUKAST SODIUM 10 MG PO TABS: 10.0000 mg | ORAL_TABLET | Freq: Every day | ORAL | 0 refills | Status: DC

## 2022-07-15 NOTE — ED Triage Notes (Signed)
Pt to ED c/o sinus problem x 2 months, reports sinus pressure, no relief with OTC medications/nasal spray.

## 2022-07-15 NOTE — ED Provider Notes (Signed)
[facility] Provider Note   CSN: [id number] Arrival date & time: 07/15/22  2027     History  Chief Complaint  Patient presents with   Facial Pain    Jodi Wade is a 62 y.o. female.  HPI   This patient is a very pleasant 62 year old female, she has a history of hypertension and high cholesterol.  She reports that she has had a couple of months of fairly persistent nasal congestion which is giving her intermittent headaches, occasional cough and some ear pressure and popping.  She was actually seen a couple of days ago and had a negative workup for her ear discomfort.  She denies fevers or chills.  She endorses being on different medications over the last month and a half including prednisone, Augmentin, doxycycline, nothing seems to help.  She stopped using Flonase 3 weeks ago because it was not helping.  Congestion is worse at night, she does sneeze occasionally, she is unsure if she is allergic to something.  No fevers or chills, drainage from nose is clear, mucousy, it is not purulent  Home Medications Prior to Admission medications   Medication Sig Start Date End Date Taking? Authorizing Provider  montelukast (SINGULAIR) 10 MG tablet Take 1 tablet (10 mg total) by mouth at bedtime. 07/15/22 08/14/22 Yes Noemi Chapel, MD  amLODipine (NORVASC) 10 MG tablet TAKE 1 TABLET BY MOUTH ONCE DAILY 09/12/19 09/11/20  Livengood, Marva Panda, PA-C  amLODipine (NORVASC) 10 MG tablet Take 1 tablet (10 mg total) by mouth daily. 12/14/21     atorvastatin (LIPITOR) 20 MG tablet Take 20 mg by mouth daily. 06/20/19   [provider]  atorvastatin (LIPITOR) 20 MG tablet TAKE 1 TABLET BY MOUTH ONCE DAILY 09/12/19 09/11/20  Livengood, Marva Panda, PA-C  atorvastatin (LIPITOR) 40 MG tablet Take 1 tablet (40 mg total) by mouth daily. 05/30/22     carbamide peroxide (DEBROX) 6.5 % OTIC solution Place 5 drops into the right ear 2 (two) times daily. 06/12/22   Maudie Flakes, MD  gabapentin (NEURONTIN) 300 MG capsule Take 1 capsule (300 mg total) by mouth 3 (three) times daily. Patient not taking: Reported on 04/24/2022 03/06/22   Carole Civil, MD  ibuprofen (ADVIL) 800 MG tablet Take 1 tablet (800 mg total) by mouth every 8 (eight) hours as needed. 03/24/22   Carole Civil, MD  levothyroxine (SYNTHROID) 88 MCG tablet Take 1 tablet (88 mcg total) by mouth daily. 05/31/22   Elayne Snare, MD  lisinopril (ZESTRIL) 20 MG tablet TAKE 1 TABLET BY MOUTH ONCE DAILY 09/12/19 09/11/20  Livengood, Marva Panda, PA-C  lisinopril (ZESTRIL) 40 MG tablet Take 1 tablet (40 mg total) by mouth daily. 05/30/22     Multiple Vitamin (MULTIVITAMIN ADULT PO) Take by mouth daily.    [provider]  tiZANidine (ZANAFLEX) 4 MG tablet Take 1 tablet (4 mg total) by mouth daily. Patient not taking: Reported on 04/24/2022 03/06/22   Carole Civil, MD      Allergies    Latex, Fexofenadine-pseudoephed er, Hydrochlorothiazide, and Pseudoephedrine    Review of Systems   Review of Systems  All other systems reviewed and are negative.   Physical Exam Updated Vital Signs BP (!) 144/79   Pulse 70   Temp 98.3 F (36.8 C) (Oral)   Resp 18   Ht 1.6 m (5' 3"$ )   Wt 93.4 kg   SpO2 99%   BMI 36.49 kg/m  Physical Exam Vitals and nursing note reviewed.  Constitutional:      General: She is not in acute distress.    Appearance: She is well-developed.  HENT:     Head: Normocephalic and atraumatic.     Nose: Rhinorrhea present.     Mouth/Throat:     Pharynx: No oropharyngeal exudate.  Eyes:     General: No scleral icterus.       Right eye: No discharge.        Left eye: No discharge.     Conjunctiva/sclera: Conjunctivae normal.     Pupils: Pupils are equal, round, and reactive to light.  Neck:     Thyroid: No thyromegaly.     Vascular: No JVD.  Cardiovascular:     Rate and Rhythm: Normal rate and regular rhythm.     Heart sounds: Normal heart sounds. No  murmur heard.    No friction rub. No gallop.  Pulmonary:     Effort: Pulmonary effort is normal. No respiratory distress.     Breath sounds: Normal breath sounds. No wheezing or rales.  Abdominal:     General: Bowel sounds are normal. There is no distension.     Palpations: Abdomen is soft. There is no mass.     Tenderness: There is no abdominal tenderness.  Musculoskeletal:        General: No tenderness. Normal range of motion.     Cervical back: Normal range of motion and neck supple.  Lymphadenopathy:     Cervical: No cervical adenopathy.  Skin:    General: Skin is warm and dry.     Findings: No erythema or rash.  Neurological:     Mental Status: She is alert.     Coordination: Coordination normal.  Psychiatric:        Behavior: Behavior normal.     ED Results / Procedures / Treatments   Labs (all labs ordered are listed, but only abnormal results are displayed) Labs Reviewed - No data to display  EKG None  Radiology No results found.  Procedures Procedures    Medications Ordered in ED Medications - No data to display  ED Course/ Medical Decision Making/ A&P                             Medical Decision Making Risk Prescription drug management.   Ears clear, nose with clear rhinorrhea bilaterally, nasal congestion with turbinate swelling bilaterally.  No maxillary sinus tenderness, clear oropharynx, normal cardiovascular exam.  Will start Singulair in addition to Zyrtec, patient agreeable, she can see ENT as an outpatient as needed, given phone number.        Final Clinical Impression(s) / ED Diagnoses Final diagnoses:  Nasal congestion    Rx / DC Orders ED Discharge Orders          Ordered    montelukast (SINGULAIR) 10 MG tablet  Daily at bedtime        07/15/22 2059              Noemi Chapel, MD 07/15/22 2101

## 2022-07-15 NOTE — Discharge Instructions (Signed)
Please take Singulair 1 pill/day.  I would definitely take this with Zyrtec 1 pill/day.  You may follow-up with an ear nose and throat doctor as needed if this is not getting any better

## 2022-07-16 ENCOUNTER — Emergency Department (HOSPITAL_COMMUNITY)
Admission: EM | Admit: 2022-07-16 | Discharge: 2022-07-16 | Disposition: A | Discharge status: Home / Self Care | Payer: 59 | Attending: Emergency Medicine | Admitting: Emergency Medicine

## 2022-07-16 ENCOUNTER — Other Ambulatory Visit: Payer: Self-pay

## 2022-07-16 DIAGNOSIS — Z91040 Latex allergy status: Secondary | ICD-10-CM | POA: Insufficient documentation

## 2022-07-16 DIAGNOSIS — R0981 Nasal congestion: Secondary | ICD-10-CM | POA: Diagnosis not present

## 2022-07-16 DIAGNOSIS — I10 Essential (primary) hypertension: Secondary | ICD-10-CM | POA: Insufficient documentation

## 2022-07-16 DIAGNOSIS — Z79899 Other long term (current) drug therapy: Secondary | ICD-10-CM | POA: Insufficient documentation

## 2022-07-16 DIAGNOSIS — E119 Type 2 diabetes mellitus without complications: Secondary | ICD-10-CM | POA: Insufficient documentation

## 2022-07-16 MED ORDER — PREDNISONE 10 MG PO TABS: ORAL_TABLET | ORAL | 0 refills | Status: DC

## 2022-07-16 MED ORDER — PREDNISONE 50 MG PO TABS
60.0000 mg | ORAL_TABLET | Freq: Once | ORAL | Status: AC
  Administered 2022-07-16: 60 mg via ORAL
  Filled 2022-07-16: qty 1

## 2022-07-16 NOTE — ED Provider Notes (Signed)
[facility] Provider Note   CSN: [id number] Arrival date & time: 07/16/22  1258     History  Chief Complaint  Patient presents with   Nasal Congestion    Jodi Wade is a 62 y.o. female with a history including hypertension, anemia, arthritis, thyroid disease and type 2 diabetes, also has a significant history of allergy under the care of Dr. Posey Pronto who was seen here yesterday and treated for chronic nasal congestion with a combination of Zyrtec and was also placed on Singulair as it wears no symptoms or signs suggesting acute sinusitis.  She is scheduled to see her ENT specialist, but returns today as she recalls that in the past prednisone has been helpful for her chronic nasal congestion symptoms.  She is desirous of a prescription for a tapering dose of prednisone.  She denies any new symptoms since being seen here yesterday, her nasal secretions have been clear, she has had no fevers, she denies sinus tenderness, sore throat, ear pain.  Has started the Singulair she was prescribed yesterday.  The history is provided by the patient.       Home Medications Prior to Admission medications   Medication Sig Start Date End Date Taking? Authorizing Provider  predniSONE (DELTASONE) 10 MG tablet 6, 5, 4, 3, 2 then 1 tablet by mouth daily for 6 days total. 07/16/22  Yes [name], Almyra Free, PA-C  amLODipine (NORVASC) 10 MG tablet TAKE 1 TABLET BY MOUTH ONCE DAILY 09/12/19 09/11/20  Livengood, Marva Panda, PA-C  amLODipine (NORVASC) 10 MG tablet Take 1 tablet (10 mg total) by mouth daily. 12/14/21     atorvastatin (LIPITOR) 20 MG tablet Take 20 mg by mouth daily. 06/20/19   [provider]  atorvastatin (LIPITOR) 20 MG tablet TAKE 1 TABLET BY MOUTH ONCE DAILY 09/12/19 09/11/20  Livengood, Marva Panda, PA-C  atorvastatin (LIPITOR) 40 MG tablet Take 1 tablet (40 mg total) by mouth daily. 05/30/22     carbamide peroxide (DEBROX) 6.5 % OTIC solution Place 5  drops into the right ear 2 (two) times daily. 06/12/22   Maudie Flakes, MD  gabapentin (NEURONTIN) 300 MG capsule Take 1 capsule (300 mg total) by mouth 3 (three) times daily. Patient not taking: Reported on 04/24/2022 03/06/22   Carole Civil, MD  ibuprofen (ADVIL) 800 MG tablet Take 1 tablet (800 mg total) by mouth every 8 (eight) hours as needed. 03/24/22   Carole Civil, MD  levothyroxine (SYNTHROID) 88 MCG tablet Take 1 tablet (88 mcg total) by mouth daily. 05/31/22   Elayne Snare, MD  lisinopril (ZESTRIL) 20 MG tablet TAKE 1 TABLET BY MOUTH ONCE DAILY 09/12/19 09/11/20  Livengood, Marva Panda, PA-C  lisinopril (ZESTRIL) 40 MG tablet Take 1 tablet (40 mg total) by mouth daily. 05/30/22     montelukast (SINGULAIR) 10 MG tablet Take 1 tablet (10 mg total) by mouth at bedtime. 07/15/22 08/14/22  Noemi Chapel, MD  Multiple Vitamin (MULTIVITAMIN ADULT PO) Take by mouth daily.    [provider]  tiZANidine (ZANAFLEX) 4 MG tablet Take 1 tablet (4 mg total) by mouth daily. Patient not taking: Reported on 04/24/2022 03/06/22   Carole Civil, MD      Allergies    Latex, Fexofenadine-pseudoephed er, Hydrochlorothiazide, and Pseudoephedrine    Review of Systems   Review of Systems  Constitutional:  Negative for chills and fever.  HENT:  Positive for congestion and rhinorrhea. Negative for ear discharge, ear pain,  sinus pressure, sore throat, trouble swallowing and voice change.   Eyes:  Negative for discharge.  Respiratory:  Negative for cough, shortness of breath, wheezing and stridor.   Cardiovascular:  Negative for chest pain.  Gastrointestinal:  Negative for abdominal pain.  Genitourinary: Negative.     Physical Exam Updated Vital Signs BP 138/83   Pulse 71   Temp 98.6 F (37 C) (Oral)   Resp 20   Ht 5' 3"$  (1.6 m)   Wt 93.4 kg   SpO2 99%   BMI 36.48 kg/m  Physical Exam Constitutional:      Appearance: She is well-developed.  HENT:     Head: Normocephalic and  atraumatic.     Right Ear: Tympanic membrane and ear canal normal.     Left Ear: Tympanic membrane and ear canal normal.     Nose: Mucosal edema and rhinorrhea present.     Mouth/Throat:     Mouth: Mucous membranes are moist.     Pharynx: Oropharynx is clear. Uvula midline. No oropharyngeal exudate or posterior oropharyngeal erythema.     Tonsils: No tonsillar abscesses.  Eyes:     Conjunctiva/sclera: Conjunctivae normal.  Cardiovascular:     Rate and Rhythm: Normal rate.     Heart sounds: Normal heart sounds.  Pulmonary:     Effort: Pulmonary effort is normal. No respiratory distress.     Breath sounds: No wheezing or rales.  Musculoskeletal:        General: Normal range of motion.  Skin:    General: Skin is warm and dry.     Findings: No rash.  Neurological:     Mental Status: She is alert and oriented to person, place, and time.     ED Results / Procedures / Treatments   Labs (all labs ordered are listed, but only abnormal results are displayed) Labs Reviewed - No data to display  EKG None  Radiology No results found.  Procedures Procedures    Medications Ordered in ED Medications  predniSONE (DELTASONE) tablet 60 mg (60 mg Oral Given 07/16/22 1414)    ED Course/ Medical Decision Making/ A&P                             Medical Decision Making Patient returns requesting additional medication of a prednisone taper for her chronic nasal congestion and rhinorrhea.  She was given a 6-day prednisone taper, she was encouraged to continue taking her Singulair and to add the Zyrtec which was recommended yesterday.  She has follow-up care with Dr. Posey Pronto, she was encouraged to keep this appointment as well.  Risk Prescription drug management.           Final Clinical Impression(s) / ED Diagnoses Final diagnoses:  Nasal congestion    Rx / DC Orders ED Discharge Orders          Ordered    predniSONE (DELTASONE) 10 MG tablet        07/16/22 1407               Evalee Jefferson, PA-C 07/16/22 1701    Davonna Belling, MD 07/19/22 [phone]

## 2022-07-16 NOTE — Discharge Instructions (Signed)
Take your next dose of prednisone tomorrow as you have received today's dose here.  Plan to keep your appointment with your allergist as scheduled.

## 2022-07-16 NOTE — ED Triage Notes (Signed)
Pt returns to er with c/o nasal congestion, was seen last night for same, reports that the issues have been ongoing for two months, was given Singulair but reports that she has taken 1 dose and after speaking the pharmacist pt returns to er requesting a steroid

## 2022-07-17 ENCOUNTER — Ambulatory Visit (INDEPENDENT_AMBULATORY_CARE_PROVIDER_SITE_OTHER): Payer: Medicaid Other | Admitting: Internal Medicine

## 2022-07-17 ENCOUNTER — Other Ambulatory Visit: Payer: Self-pay

## 2022-07-17 ENCOUNTER — Encounter: Payer: Self-pay | Admitting: Internal Medicine

## 2022-07-17 VITALS — BP 122/82 | HR 99 | Temp 98.0°F | Resp 16 | Ht 64.0 in | Wt 205.2 lb

## 2022-07-17 DIAGNOSIS — J302 Other seasonal allergic rhinitis: Secondary | ICD-10-CM

## 2022-07-17 DIAGNOSIS — J3089 Other allergic rhinitis: Secondary | ICD-10-CM

## 2022-07-17 DIAGNOSIS — J310 Chronic rhinitis: Secondary | ICD-10-CM

## 2022-07-17 DIAGNOSIS — R0602 Shortness of breath: Secondary | ICD-10-CM | POA: Diagnosis not present

## 2022-07-17 MED ORDER — CETIRIZINE HCL 10 MG PO TABS: 10.0000 mg | ORAL_TABLET | Freq: Every day | ORAL | 5 refills | Status: DC

## 2022-07-17 MED ORDER — MONTELUKAST SODIUM 10 MG PO TABS: 10.0000 mg | ORAL_TABLET | Freq: Every day | ORAL | 5 refills | Status: DC

## 2022-07-17 MED ORDER — AZELASTINE HCL 0.1 % NA SOLN: 1.0000 | Freq: Two times a day (BID) | NASAL | 5 refills | Status: DC

## 2022-07-17 NOTE — Progress Notes (Signed)
NEW PATIENT  Date of Service/Encounter:  07/17/22  Consult requested by: Kathyrn Lass, MD   Subjective:   Jodi Wade (DOB: June 05, 1961) is a 62 y.o. female who presents to the clinic on 07/17/2022 with a chief complaint of Sinus Problem (States she went to PCP and was treated with antibiotics, prednisone, and steroid injections. Per ED notes she is to see Dr. Benjamine Mola for ENT evaluation. ), Allergic Rhinitis  (States she is aware of her allergies and wants to be treated for them. States she can't breathe out of her nose. Nasal sprays don't work well. States she is allergic to cats, dogs, dust mites and causes issues with her vision. ), and Asthma (States she went to the ED due to issues breathing. ) .    History obtained from: chart review and patient.   Rhinitis Shortness of breath Started years ago. It has worsened since January 2024.  They have tried antibiotics which did not help.  She has had improvement with prednisone.   In the past, she has had cats and dogs that also bothered her sinuses.   Symptoms include:  dry nose, nasal congestion, rhinorrhea, and post nasal drainage  Itchy nose also She feels so congested that she can't breath and this causes her to feel short of breath. Denies wheezing or history of asthma.    Occurs year-round  Potential triggers: cats, dogs, dust mite  Treatments tried:  Flonase PRN but it makes her very dry. Zyrtec or Claritin PRN; last use was a while ago  Singulair PRN  Previous allergy testing: no History of reflux/heartburn: no History of sinus surgery: no Nonallergic triggers: dust  07/10/2022: seen by Dr. Elray Mcgregor ENT for nasal congestion.  Tried antibiotics and prednisone which help but symptoms reoccur. Started on INCS and saline rinses with vaseline.  Can consider endoscopy and CT sinus if symptoms persist.  07/15/2022: seen in ER for nasal congestion and facial pain and headaches.  Some cough and ear pressure also. Used Flonase  in the past. Started on [employer] and Zyrtec.   07/16/2022: seen in ER for chronic nasal congestion. On Zyrtec and Singulair. Wanted prednisone. Started on prednisone taper yesterday.    Past Medical History: Past Medical History:  Diagnosis Date   Allergy    seasonal   Anemia    Anxiety    Arthritis    knee right   Diabetes mellitus without complication (HCC)    pt.denies,don't   GERD (gastroesophageal reflux disease)    Heart murmur    Hyperlipidemia    controlled   Hypertension    Thyroid disease    Past Surgical History: Past Surgical History:  Procedure Laterality Date   BREAST BIOPSY Right    COLONOSCOPY     KNEE ARTHROSCOPY WITH MEDIAL MENISECTOMY Right 01/13/2020   Procedure: KNEE ARTHROSCOPY WITH MEDIAL AND LATERAL  MENISCECTOMY;  Surgeon: Carole Civil, MD;  Location: [facility];  Service: Orthopedics;  Laterality: Right;   PARTIAL HYSTERECTOMY     abdominal    Family History: Family History  Problem Relation Age of Onset   Cancer Mother    Breast cancer Mother    Thyroid disease Mother    Allergic rhinitis Father    Thyroid disease Maternal Aunt    Thyroid disease Son        Hyperthyroid, not treated   Diabetes Other    Hypertension Other    Colon cancer Neg Hx    Colon polyps Neg Hx  Crohn's disease Neg Hx    Esophageal cancer Neg Hx    Rectal cancer Neg Hx    Stomach cancer Neg Hx    Ulcerative colitis Neg Hx     Social History:  Lives in a 59 year house Flooring in bedroom: carpet Pets: none Tobacco use/exposure: none Job: n/a  Medication List:  Allergies as of 07/17/2022       Reactions   Fexofenadine-pseudoephed Er Other (See Comments)   fast heart rate   Hydrochlorothiazide Other (See Comments)   Hypokalemia   Latex Hives   Pseudoephedrine Other (See Comments)   Heart Rate        Medication List        Accurate as of July 17, 2022  4:49 PM. If you have any questions, ask your nurse or doctor.          STOP  taking these medications    carbamide peroxide 6.5 % OTIC solution Commonly known as: DEBROX Stopped by: Larose Kells, MD   gabapentin 300 MG capsule Commonly known as: NEURONTIN Stopped by: Larose Kells, MD   ibuprofen 800 MG tablet Commonly known as: ADVIL Stopped by: Larose Kells, MD   tiZANidine 4 MG tablet Commonly known as: Zanaflex Stopped by: Larose Kells, MD       TAKE these medications    amLODipine 10 MG tablet Commonly known as: NORVASC Take 1 tablet (10 mg total) by mouth daily. What changed: Another medication with the same name was removed. Continue taking this medication, and follow the directions you see here. Changed by: Larose Kells, MD   atorvastatin 40 MG tablet Commonly known as: LIPITOR Take 1 tablet (40 mg total) by mouth daily. What changed: Another medication with the same name was removed. Continue taking this medication, and follow the directions you see here. Changed by: Larose Kells, MD   levothyroxine 88 MCG tablet Commonly known as: SYNTHROID Take 1 tablet (88 mcg total) by mouth daily.   lisinopril 40 MG tablet Commonly known as: ZESTRIL Take 1 tablet (40 mg total) by mouth daily. What changed: Another medication with the same name was removed. Continue taking this medication, and follow the directions you see here. Changed by: Larose Kells, MD   montelukast 10 MG tablet Commonly known as: Singulair Take 1 tablet (10 mg total) by mouth at bedtime.   MULTIVITAMIN ADULT PO Take by mouth daily.   predniSONE 10 MG tablet Commonly known as: DELTASONE 6, 5, 4, 3, 2 then 1 tablet by mouth daily for 6 days total.         REVIEW OF SYSTEMS: Pertinent positives and negatives discussed in HPI.   Objective:   Physical Exam: BP 122/82   Pulse 99   Temp 98 F (36.7 C) (Temporal)   Resp 16   Ht 5' 4"$  (1.626 m)   Wt 205 lb 3.2 oz (93.1 kg)   SpO2 96%   BMI 35.22 kg/m  Body mass index is 35.22 kg/m. GEN: alert, well  developed HEENT: clear conjunctiva, TM grey and translucent, nose with + inferior turbinate hypertrophy, pink nasal mucosa, slight clear rhinorrhea, no cobblestoning HEART: regular rate and rhythm, no murmur LUNGS: clear to auscultation bilaterally, no coughing, unlabored respiration ABDOMEN: soft, non distended  SKIN: no rashes or lesions  Reviewed:  07/10/2022: seen by Dr. Elray Mcgregor ENT for nasal congestion.  Tried antibiotics and prednisone which help but symptoms reoccur. Started on INCS and saline rinses with vaseline.  Can consider endoscopy and CT sinus if symptoms persist.  07/15/2022: seen in ER for nasal congestion and facial pain and headaches.  Some cough and ear pressure also. Used Flonase in the past. Started on [employer] and Zyrtec.   07/16/2022: seen in ER for chronic nasal congestion. On Zyrtec and Singulair. Wanted prednisone. Started on prednisone taper yesterday.    Spirometry:  Tracings reviewed. Her effort: Variable effort-results affected. FVC: 1.92L FEV1: 1.56L, 74% predicted FEV1/FVC ratio: 81% Interpretation: Spirometry consistent with possible restrictive disease.  Please see scanned spirometry results for details.  Skin Testing:  Skin prick testing was placed, which includes aeroallergens/foods, histamine control, and saline control.  Verbal consent was obtained prior to placing test.  Patient tolerated procedure well.  Allergy testing results were read and interpreted by myself, documented by clinical staff. Adequate positive and negative control.  Results discussed with patient/family.  Airborne Adult Perc - 07/17/22 1613     Time Antigen Placed 1400    Allergen Manufacturer Lavella Hammock    Location Back    Number of Test 59    1. Control-Buffer 50% Glycerol Negative    2. Control-Histamine 1 mg/ml 3+    3. Albumin saline Negative    4. [place]on Negative    5. [place] Negative    6. Johnson Negative    7. [place] Blue Negative    8. Meadow Fescue Negative     9. Perennial Rye Negative    10. Sweet Vernal Negative    11. Timothy 2+    12. Cocklebur Negative    13. Burweed Marshelder Negative    14. Ragweed, short Negative    15. Ragweed, Giant 2+    16. Plantain,  English Negative    17. Lamb's Quarters Negative    18. Sheep Sorrell Negative    19. Rough Pigweed Negative    20. Marsh Elder, Rough Negative    21. Mugwort, Common Negative    22. Ash mix Negative    23. Birch mix Negative    24. Beech American Negative    25. Box, Elder Negative    26. Cedar, red Negative    27. Cottonwood, [place] Negative    28. Elm mix Negative    29. Hickory Negative    30. Maple mix Negative    31. Oak, [place] mix Negative    32. Pecan Pollen Negative    33. Pine mix Negative    34. Sycamore Eastern Negative    35. [place], Black Pollen Negative    36. Alternaria alternata Negative    37. Cladosporium Herbarum Negative    38. Aspergillus mix Negative    39. Penicillium mix Negative    40. Bipolaris sorokiniana (Helminthosporium) Negative    41. Drechslera spicifera (Curvularia) Negative    42. Mucor plumbeus Negative    43. Fusarium moniliforme Negative    44. Aureobasidium pullulans (pullulara) Negative    45. Rhizopus oryzae Negative    46. Botrytis cinera Negative    47. Epicoccum nigrum Negative    48. Phoma betae Negative    49. Candida Albicans Negative    50. Trichophyton mentagrophytes Negative    51. Mite, D Farinae  5,000 AU/ml Negative    52. Mite, D Pteronyssinus  5,000 AU/ml Negative    53. Cat Hair 10,000 BAU/ml 3+    54.  Dog Epithelia 2+    55. Mixed Feathers Negative    56. Horse Epithelia Negative    57. Cockroach, [place] Negative  58. Mouse Negative    59. Tobacco Leaf Negative               Assessment:   1. Chronic rhinitis   2. Shortness of breath     Plan/Recommendations:  Allergic Rhinitis: - Positive skin test 07/2022: grasses, weeds, cat, dog - Avoidance measures discussed. - Use nasal saline  rinses before nose sprays such as with Neilmed Sinus Rinse.  Use distilled water.   - Use Azelastine 1 sprays each nostril twice daily. Aim upward and outward. - Use Zyrtec 10 mg daily.  - Use Singulair 45m daily.   - Avoid prednisone due to side effects and short term improvement.   Shortness of breath - likely due to uncontrolled upper airway. Will treat as discussed above.  - no history of asthma and spirometry with possible restriction, mild likely due to technique.  No obstruction which argues against asthma.   Return in about 6 weeks (around 08/28/2022).  PHarlon Flor MD [facility]

## 2022-07-17 NOTE — Patient Instructions (Addendum)
Allergic Rhinitis: - Positive skin test 07/2022: grasses, weeds, cat, dog - Avoidance measures discussed. - Use nasal saline rinses before nose sprays such as with Neilmed Sinus Rinse.  Use distilled water.   - Use Azelastine 1 sprays each nostril twice daily. Aim upward and outward. - Use Zyrtec 10 mg daily.  - Use Singulair 33m daily.    ALLERGEN AVOIDANCE MEASURES  Pollen Avoidance Pollen levels are highest during the mid-day and afternoon.  Consider this when planning outdoor activities. Avoid being outside when the grass is being mowed, or wear a mask if the pollen-allergic person must be the one to mow the grass. Keep the windows closed to keep pollen outside of the home. Use an air conditioner to filter the air. Take a shower, wash hair, and change clothing after working or playing outdoors during pollen season. Pet Dander Keep the pet out of your bedroom and restrict it to only a few rooms. Be advised that keeping the pet in only one room will not limit the allergens to that room. Don't pet, hug or kiss the pet; if you do, wash your hands with soap and water. High-efficiency particulate air (HEPA) cleaners run continuously in a bedroom or living room can reduce allergen levels over time. Regular use of a high-efficiency vacuum cleaner or a central vacuum can reduce allergen levels. Giving your pet a bath at least once a week can reduce airborne allergen.

## 2022-07-18 DIAGNOSIS — J309 Allergic rhinitis, unspecified: Secondary | ICD-10-CM | POA: Diagnosis not present

## 2022-07-18 DIAGNOSIS — J342 Deviated nasal septum: Secondary | ICD-10-CM | POA: Diagnosis not present

## 2022-07-18 DIAGNOSIS — J343 Hypertrophy of nasal turbinates: Secondary | ICD-10-CM | POA: Diagnosis not present

## 2022-07-18 DIAGNOSIS — R0981 Nasal congestion: Secondary | ICD-10-CM | POA: Diagnosis not present

## 2022-07-18 NOTE — Addendum Note (Signed)
Addended by: Norville Haggard on: 07/18/2022 10:29 AM   Modules accepted: Orders

## 2022-07-19 ENCOUNTER — Telehealth: Payer: Self-pay | Admitting: Internal Medicine

## 2022-07-19 NOTE — Telephone Encounter (Signed)
error 

## 2022-07-20 ENCOUNTER — Ambulatory Visit: Payer: Self-pay | Admitting: Orthopedic Surgery

## 2022-07-20 DIAGNOSIS — J309 Allergic rhinitis, unspecified: Secondary | ICD-10-CM | POA: Diagnosis not present

## 2022-07-20 DIAGNOSIS — J329 Chronic sinusitis, unspecified: Secondary | ICD-10-CM | POA: Diagnosis not present

## 2022-07-23 ENCOUNTER — Emergency Department (HOSPITAL_COMMUNITY)
Admission: EM | Admit: 2022-07-23 | Discharge: 2022-07-23 | Disposition: A | Discharge status: Home / Self Care | Payer: 59 | Attending: Emergency Medicine | Admitting: Emergency Medicine

## 2022-07-23 ENCOUNTER — Encounter (HOSPITAL_COMMUNITY): Payer: Self-pay | Admitting: Emergency Medicine

## 2022-07-23 ENCOUNTER — Other Ambulatory Visit: Payer: Self-pay

## 2022-07-23 DIAGNOSIS — Z91040 Latex allergy status: Secondary | ICD-10-CM | POA: Diagnosis not present

## 2022-07-23 DIAGNOSIS — J3089 Other allergic rhinitis: Secondary | ICD-10-CM | POA: Diagnosis not present

## 2022-07-23 DIAGNOSIS — J309 Allergic rhinitis, unspecified: Secondary | ICD-10-CM | POA: Diagnosis not present

## 2022-07-23 DIAGNOSIS — R0981 Nasal congestion: Secondary | ICD-10-CM | POA: Diagnosis present

## 2022-07-23 NOTE — ED Provider Notes (Signed)
[facility]  Provider Note  CSN: [id number] Arrival date & time: 07/23/22 0411  History Chief Complaint  Patient presents with   Nasal Congestion    Jodi Wade is a 62 y.o. female with chronic allergic rhinitis has been to PCP, ENTx2, Allergist and ED x 2 in the last few weeks for nasal congestion. She has been given numerous medications including nasal sprays which she has trouble tolerating. She states she only gets relief when on steroids but has been told by her allergist she can't be on long term oral steroids. She is scheduled for a follow up next week to discuss initiating immunotherapy. She came to the ED tonight requesting IM steroid injection.    Home Medications Prior to Admission medications   Medication Sig Start Date End Date Taking? Authorizing Provider  amLODipine (NORVASC) 10 MG tablet Take 1 tablet (10 mg total) by mouth daily. 12/14/21     atorvastatin (LIPITOR) 40 MG tablet Take 1 tablet (40 mg total) by mouth daily. 05/30/22     azelastine (ASTELIN) 0.1 % nasal spray Place 1 spray into both nostrils 2 (two) times daily. Use in each nostril as directed 07/17/22   Larose Kells, MD  cetirizine (ZYRTEC ALLERGY) 10 MG tablet Take 1 tablet (10 mg total) by mouth daily. 07/17/22   Larose Kells, MD  levothyroxine (SYNTHROID) 88 MCG tablet Take 1 tablet (88 mcg total) by mouth daily. 05/31/22   Elayne Snare, MD  lisinopril (ZESTRIL) 40 MG tablet Take 1 tablet (40 mg total) by mouth daily. 05/30/22     montelukast (SINGULAIR) 10 MG tablet Take 1 tablet (10 mg total) by mouth at bedtime. 07/17/22   Larose Kells, MD  Multiple Vitamin (MULTIVITAMIN ADULT PO) Take by mouth daily.    [provider]  predniSONE (DELTASONE) 10 MG tablet 6, 5, 4, 3, 2 then 1 tablet by mouth daily for 6 days total. 07/16/22   Idol, Almyra Free, PA-C     Allergies    Fexofenadine-pseudoephed er, Hydrochlorothiazide, Latex, and  Pseudoephedrine   Review of Systems   Review of Systems Please see HPI for pertinent positives and negatives  Physical Exam BP 139/82 (BP Location: Left Arm)   Pulse (!) 58   Temp 97.9 F (36.6 C) (Oral)   Resp 16   Ht 5' 4"$  (1.626 m)   Wt 93.8 kg   SpO2 98%   BMI 35.49 kg/m   Physical Exam Vitals and nursing note reviewed.  HENT:     Head: Normocephalic.     Nose: Congestion present.  Eyes:     Extraocular Movements: Extraocular movements intact.  Pulmonary:     Effort: Pulmonary effort is normal.  Musculoskeletal:        General: Normal range of motion.     Cervical back: Neck supple.  Skin:    Findings: No rash (on exposed skin).  Neurological:     Mental Status: She is alert and oriented to person, place, and time.  Psychiatric:        Mood and Affect: Mood normal.     ED Results / Procedures / Treatments   EKG None  Procedures Procedures  Medications Ordered in the ED Medications - No data to display  Initial Impression and Plan  Patient here for chronic nasal congestion, advised that systemic steroids are not indicated. No further ED workup or treatment is necessary. Encouraged to continue her outpatient management with Allergist.  ED Course       MDM Rules/Calculators/A&P Medical Decision Making Problems Addressed: Allergic rhinitis, unspecified seasonality, unspecified trigger: chronic illness or injury     Final Clinical Impression(s) / ED Diagnoses Final diagnoses:  Allergic rhinitis, unspecified seasonality, unspecified trigger    Rx / DC Orders ED Discharge Orders     None        Truddie Hidden, MD 07/23/22 [phone]

## 2022-07-23 NOTE — ED Triage Notes (Signed)
Pt c/o continued nasal congestion for the past several months. Pt has followed up with ENT and has upcoming appt with another specialized MD. Pt states her appt is on Monday.

## 2022-07-24 ENCOUNTER — Ambulatory Visit: Payer: Commercial Managed Care - PPO | Admitting: Internal Medicine

## 2022-07-24 ENCOUNTER — Ambulatory Visit (INDEPENDENT_AMBULATORY_CARE_PROVIDER_SITE_OTHER): Payer: 59 | Admitting: Internal Medicine

## 2022-07-24 ENCOUNTER — Telehealth: Payer: Self-pay

## 2022-07-24 ENCOUNTER — Encounter: Payer: Self-pay | Admitting: Internal Medicine

## 2022-07-24 VITALS — BP 132/82 | HR 77 | Temp 98.2°F | Resp 18

## 2022-07-24 DIAGNOSIS — J302 Other seasonal allergic rhinitis: Secondary | ICD-10-CM

## 2022-07-24 DIAGNOSIS — J3089 Other allergic rhinitis: Secondary | ICD-10-CM

## 2022-07-24 MED ORDER — XLEAR SINUS CARE SPRAY NA SOLN: 2.0000 | Freq: Two times a day (BID) | NASAL | 11 refills | Status: DC

## 2022-07-24 MED ORDER — EPINEPHRINE 0.3 MG/0.3ML IJ SOAJ: 0.3000 mg | INTRAMUSCULAR | 1 refills | Status: DC | PRN

## 2022-07-24 NOTE — Patient Instructions (Addendum)
Allergic Rhinitis: - Positive skin test 07/2022: grasses, weeds, cat, dog - Avoidance measures discussed. - Use nasal saline rinses before nose sprays such as with Neilmed Sinus Rinse with Xylitol.  Use distilled water.   - Use Azelastine 1 sprays each nostril twice daily as needed for runny nose, sneezing and congestion. Aim upward and outward. - Use Xlear Natural Saline Nasal Spray with Xylitol twice daily as needed to help with dry nose.  - Use Zyrtec 10 mg daily.  - Use Singulair 30m daily.   - Consider allergy shots as long term control of your symptoms by teaching your immune system to be more tolerant of your allergy triggers.  Discuss with your insurance company regarding coverage and if interested, please call [facility] and we will work on mixing the vials.  Will also call in Epipen then.    Keep the appointment in March.

## 2022-07-24 NOTE — Telephone Encounter (Signed)
Patient called to schedule an appointment to start allergy shots. She had an office appointment with Dr.Patel in [place] today (07/24/22) and verified that she signed consent forms at this visit.   Patient is scheduled for 08/21/22 at 10:30 in [place] for allergy shot new start.

## 2022-07-24 NOTE — Progress Notes (Signed)
   FOLLOW UP Date of Service/Encounter:  07/24/22   Subjective:  Jodi Wade (DOB: 03/18/1961) is a 62 y.o. female who returns to the [facility] [place] on 07/24/2022 for follow up for allergic rhinitis.   History obtained from: chart review and patient. Lat visit was with me on 07/17/2022 for rhinitis and was positive to grasses, weeds, cat and dog.  We started on INAH, Singulair and OAH. Spirometry at the time was normal as she was complaining of SOB.  Reports since last visit, she has tried the nose spray but it is causing a lot of dryness in her nose. She feels like her nose is dry and really stuffy and she just can't breath through it.  She also has sinus headaches intermittently.  Not much runny nose or drainage currently. She is also taking the Singulair and Zyrtec.  She would like to try allergy shots.  She has SOB because she can't breath through her nose; no wheezing or leg swelling.  Denies any chest pain or palpitations.  No cardiac history or lung disease.   Past Medical History: Past Medical History:  Diagnosis Date   Allergy    seasonal   Anemia    Anxiety    Arthritis    knee right   Diabetes mellitus without complication (HCC)    pt.denies,don't   GERD (gastroesophageal reflux disease)    Heart murmur    Hyperlipidemia    controlled   Hypertension    Thyroid disease     Objective:  BP 132/82   Pulse 77   Temp 98.2 F (36.8 C)   Resp 18   SpO2 99%  There is no height or weight on file to calculate BMI. Physical Exam: GEN: alert, well developed HEENT: clear conjunctiva, TM grey and translucent, nose with mild inferior turbinate hypertrophy, pink nasal mucosa, no rhinorrhea, + cobblestoning HEART: regular rate and rhythm, no murmur LUNGS: clear to auscultation bilaterally, no coughing, unlabored respiration SKIN: no rashes or lesions  Assessment:   1. Seasonal and perennial allergic rhinitis     Plan/Recommendations:   Allergic  Rhinitis: - Positive skin test 07/2022: grasses, weeds, cat, dog - Avoidance measures discussed. - Use nasal saline rinses before nose sprays such as with Neilmed Sinus Rinse with Xylitol.  Use distilled water.   - Use Azelastine 1 sprays each nostril twice daily as needed for runny nose, sneezing and congestion. Aim upward and outward. - Use Xlear Natural Saline Nasal Spray with Xylitol twice daily as needed to help with dry nose.  - Use Zyrtec 10 mg daily.  - Use Singulair 44m daily.   - Consider allergy shots as long term control of your symptoms by teaching your immune system to be more tolerant of your allergy triggers.  Discuss with your insurance company regarding coverage and if interested, please call [facility] and we will work on mixing the vials.  Will also call in Epipen then.     Keep the appointment in March.   PHarlon Flor MD [facility]

## 2022-07-25 DIAGNOSIS — J3081 Allergic rhinitis due to animal (cat) (dog) hair and dander: Secondary | ICD-10-CM | POA: Diagnosis not present

## 2022-07-25 NOTE — Progress Notes (Signed)
VIALS EXP 07-26-23

## 2022-07-25 NOTE — Progress Notes (Signed)
Aeroallergen Immunotherapy   Ordering Provider: Harlon Flor   Patient Details  Name: Jodi Wade  MRN: [id number]  Date of Birth: 07-25-1960   Order 1 of 1   Vial Label: grass, weed, cat, dog   0.3 ml (Volume)  BAU Concentration -- 7 Grass Mix* 100,000 ([address] [place], [place], [place], [place], [place], Sweet [place], [place])  0.3 ml (Volume)  1:20 Concentration -- Ragweed Mix  0.5 ml (Volume)  1:10 Concentration -- Cat Hair  0.5 ml (Volume)  1:10 Concentration -- Dog Epithelia    1.6  ml Extract Subtotal  3.4  ml Diluent  5.0  ml Maintenance Total   Schedule:  B  Silver Vial (1:1,000,000): Schedule B (6 doses)  Blue Vial (1:100,000): Schedule B (6 doses)  Yellow Vial (1:10,000): Schedule B (6 doses)  Green Vial (1:1,000): Schedule B (6 doses)  Red Vial (1:100): Schedule A (10 doses)   Special Instructions: Bring Epipen

## 2022-07-28 ENCOUNTER — Ambulatory Visit (HOSPITAL_COMMUNITY)
Admission: EM | Admit: 2022-07-28 | Discharge: 2022-07-28 | Disposition: A | Discharge status: Home / Self Care | Payer: 59 | Attending: Family Medicine | Admitting: Family Medicine

## 2022-07-28 ENCOUNTER — Encounter (HOSPITAL_COMMUNITY): Payer: Self-pay

## 2022-07-28 DIAGNOSIS — J309 Allergic rhinitis, unspecified: Secondary | ICD-10-CM

## 2022-07-28 DIAGNOSIS — Z6834 Body mass index (BMI) 34.0-34.9, adult: Secondary | ICD-10-CM | POA: Diagnosis not present

## 2022-07-28 DIAGNOSIS — F418 Other specified anxiety disorders: Secondary | ICD-10-CM | POA: Diagnosis not present

## 2022-07-28 DIAGNOSIS — R0981 Nasal congestion: Secondary | ICD-10-CM | POA: Diagnosis not present

## 2022-07-28 MED ORDER — METHYLPREDNISOLONE SODIUM SUCC 125 MG IJ SOLR
80.0000 mg | Freq: Once | INTRAMUSCULAR | Status: AC
  Administered 2022-07-28: 80 mg via INTRAMUSCULAR

## 2022-07-28 MED ORDER — METHYLPREDNISOLONE SODIUM SUCC 125 MG IJ SOLR
INTRAMUSCULAR | Status: AC
  Filled 2022-07-28: qty 2

## 2022-07-28 NOTE — Discharge Instructions (Addendum)
You were seen today for chronic allergic rhinitis.  I have given you a shot of steroid today while here. Since you just finished a course of prednisone I cannot repeat another oral dose pack at this time.  I recommend you continue singulair and add zyrtec as well for your symptoms.  Please follow up with your allergist as directed.

## 2022-07-28 NOTE — ED Triage Notes (Signed)
Pt presents for nasal congestion for several months. Pt reports she is seeing an allergic but this has not help. Pt just finished a taper dose of prednisone.

## 2022-07-28 NOTE — ED Provider Notes (Signed)
[facility]    CSN: [id number] Arrival date & time: 07/28/22  1440      History   Chief Complaint Chief Complaint  Patient presents with   Nasal Congestion    HPI Jodi Wade is a 62 y.o. female.   Patient is here for nasal congestion for several months.  She used an otc nasal sprays, so many.  She has been using them up to 4-5 times/day.  She has recently stopped due to nose bleeds.  She did see an ENT, had a CT and scope which was normal.  She also saw an allergist and the ER.  Has been to multiple drs.  She was also given zyrtec and singulair without help.   She is signed up for allergy shots, but that is not until next month.  She just finished oral prednisone, she thinks her last pill was yesterday.  She states she was in the ER on 2/28, and picked up prednisone, but she never took it and threw it away.  She states she got the prednisone from another urgent care over the weekend.  She states she has only been given prednisone twice her entire life.        Past Medical History:  Diagnosis Date   Allergy    seasonal   Anemia    Anxiety    Arthritis    knee right   Diabetes mellitus without complication (HCC)    pt.denies,don't   GERD (gastroesophageal reflux disease)    Heart murmur    Hyperlipidemia    controlled   Hypertension    Thyroid disease     Patient Active Problem List   Diagnosis Date Noted   S/P arthroscopy of right knee 01/13/20  02/16/2020   Derangement of posterior horn of medial meniscus of right knee    Meniscus, lateral, derangement, right    Osteoarthritis of knee 07/19/2018   Pain in right knee 06/11/2018   Postablative hypothyroidism 04/22/2018   Hyperlipemia 01/13/2016   VITAMIN D DEFICIENCY 04/01/2008    Past Surgical History:  Procedure Laterality Date   BREAST BIOPSY Right    COLONOSCOPY     KNEE ARTHROSCOPY WITH MEDIAL MENISECTOMY Right 01/13/2020   Procedure: KNEE ARTHROSCOPY WITH MEDIAL AND LATERAL   MENISCECTOMY;  Surgeon: Carole Civil, MD;  Location: [facility];  Service: Orthopedics;  Laterality: Right;   PARTIAL HYSTERECTOMY     abdominal    OB History   No obstetric history on file.      Home Medications    Prior to Admission medications   Medication Sig Start Date End Date Taking? Authorizing Provider  amLODipine (NORVASC) 10 MG tablet Take 1 tablet (10 mg total) by mouth daily. 12/14/21  Yes   atorvastatin (LIPITOR) 40 MG tablet Take 1 tablet (40 mg total) by mouth daily. 05/30/22  Yes   azelastine (ASTELIN) 0.1 % nasal spray Place 1 spray into both nostrils 2 (two) times daily. Use in each nostril as directed 07/17/22  Yes Larose Kells, MD  cetirizine (ZYRTEC ALLERGY) 10 MG tablet Take 1 tablet (10 mg total) by mouth daily. 07/17/22  Yes Larose Kells, MD  EPINEPHrine (EPIPEN 2-PAK) 0.3 mg/0.3 mL IJ SOAJ injection Inject 0.3 mg into the muscle as needed for anaphylaxis. 07/24/22  Yes Larose Kells, MD  levothyroxine (SYNTHROID) 88 MCG tablet Take 1 tablet (88 mcg total) by mouth daily. 05/31/22  Yes Elayne Snare, MD  ibuprofen (ADVIL) 800 MG tablet 1 tablet  with food or milk as needed Orally every 8 hrs    [provider]  lisinopril (ZESTRIL) 40 MG tablet Take 1 tablet (40 mg total) by mouth daily. 05/30/22     MEDROL 4 MG TBPK tablet as directed Orally as directed for 6 days Patient not taking: Reported on 07/24/2022 07/23/22   [provider]  montelukast (SINGULAIR) 10 MG tablet Take 1 tablet (10 mg total) by mouth at bedtime. 07/17/22   Larose Kells, MD  Multiple Vitamin (MULTIVITAMIN ADULT PO) Take by mouth daily.    [provider]  predniSONE (DELTASONE) 10 MG tablet 6, 5, 4, 3, 2 then 1 tablet by mouth daily for 6 days total. Patient not taking: Reported on 07/24/2022 07/16/22   Evalee Jefferson, PA-C  Sodium Chloride-Xylitol (XLEAR SINUS CARE SPRAY) SOLN Place 2 sprays into the nose in the morning and at bedtime. 07/24/22   Larose Kells, MD     Family History Family History  Problem Relation Age of Onset   Cancer Mother    Breast cancer Mother    Thyroid disease Mother    Allergic rhinitis Father    Thyroid disease Maternal Aunt    Thyroid disease Son        Hyperthyroid, not treated   Diabetes Other    Hypertension Other    Colon cancer Neg Hx    Colon polyps Neg Hx    Crohn's disease Neg Hx    Esophageal cancer Neg Hx    Rectal cancer Neg Hx    Stomach cancer Neg Hx    Ulcerative colitis Neg Hx     Social History Social History   Tobacco Use   Smoking status: Never    Passive exposure: Current (husband smokes)   Smokeless tobacco: Never  Vaping Use   Vaping Use: Never used  Substance Use Topics   Alcohol use: Yes    Comment: Social   Drug use: No     Allergies   Fexofenadine-pseudoephed er, Hydrochlorothiazide, Latex, and Pseudoephedrine   Review of Systems Review of Systems  Constitutional: Negative.   HENT:  Positive for congestion and rhinorrhea. Negative for sinus pressure and sinus pain.   Respiratory: Negative.    Cardiovascular: Negative.   Gastrointestinal: Negative.   Musculoskeletal: Negative.   Psychiatric/Behavioral: Negative.       Physical Exam Triage Vital Signs ED Triage Vitals [07/28/22 1456]  Enc Vitals Group     BP 115/82     Pulse Rate 88     Resp 18     Temp 98.3 F (36.8 C)     Temp Source Oral     SpO2 98 %     Weight      Height      Head Circumference      Peak Flow      Pain Score      Pain Loc      Pain Edu?      Excl. in [place]?    No data found.  Updated Vital Signs BP 115/82 (BP Location: Left Arm)   Pulse 88   Temp 98.3 F (36.8 C) (Oral)   Resp 18   SpO2 98%   Visual Acuity Right Eye Distance:   Left Eye Distance:   Bilateral Distance:    Right Eye Near:   Left Eye Near:    Bilateral Near:     Physical Exam Constitutional:      Appearance: Normal appearance.  HENT:  Head: Normocephalic.     Right Ear: Tympanic membrane  normal.     Left Ear: Tympanic membrane normal.     Nose: Congestion present.     Right Sinus: No maxillary sinus tenderness or frontal sinus tenderness.     Left Sinus: No maxillary sinus tenderness or frontal sinus tenderness.     Mouth/Throat:     Mouth: Mucous membranes are moist.  Cardiovascular:     Rate and Rhythm: Normal rate and regular rhythm.  Pulmonary:     Effort: Pulmonary effort is normal.  Musculoskeletal:     Cervical back: Normal range of motion and neck supple. No tenderness.  Neurological:     General: No focal deficit present.     Mental Status: She is alert.  Psychiatric:        Mood and Affect: Mood normal.      UC Treatments / Results  Labs (all labs ordered are listed, but only abnormal results are displayed) Labs Reviewed - No data to display  EKG   Radiology No results found.  Procedures Procedures (including critical care time)  Medications Ordered in UC Medications  methylPREDNISolone sodium succinate (SOLU-MEDROL) 125 mg/2 mL injection 80 mg (has no administration in time range)    Initial Impression / Assessment and Plan / UC Course  I have reviewed the triage vital signs and the nursing notes.  Pertinent labs & imaging results that were available during my care of the patient were reviewed by me and considered in my medical decision making (see chart for details).    Final Clinical Impressions(s) / UC Diagnoses   Final diagnoses:  Allergic rhinitis, unspecified seasonality, unspecified trigger     Discharge Instructions      You were seen today for chronic allergic rhinitis.  I have given you a shot of steroid today while here. Since you just finished a course of prednisone I cannot repeat another oral dose pack at this time.  I recommend you continue singulair and add zyrtec as well for your symptoms.  Please follow up with your allergist as directed.     ED Prescriptions   None    PDMP not reviewed this encounter.    Rondel Oh, MD 07/28/22 1534

## 2022-07-31 ENCOUNTER — Ambulatory Visit (INDEPENDENT_AMBULATORY_CARE_PROVIDER_SITE_OTHER): Payer: 59 | Admitting: Internal Medicine

## 2022-07-31 ENCOUNTER — Ambulatory Visit: Payer: 59

## 2022-07-31 VITALS — BP 114/76 | HR 84 | Temp 98.5°F | Resp 14 | Ht 63.75 in | Wt 201.4 lb

## 2022-07-31 DIAGNOSIS — J3489 Other specified disorders of nose and nasal sinuses: Secondary | ICD-10-CM | POA: Diagnosis not present

## 2022-07-31 DIAGNOSIS — J309 Allergic rhinitis, unspecified: Secondary | ICD-10-CM | POA: Diagnosis not present

## 2022-07-31 DIAGNOSIS — J302 Other seasonal allergic rhinitis: Secondary | ICD-10-CM

## 2022-07-31 MED ORDER — MONTELUKAST SODIUM 10 MG PO TABS: 10.0000 mg | ORAL_TABLET | Freq: Every day | ORAL | 5 refills | Status: DC

## 2022-07-31 MED ORDER — EPINEPHRINE 0.3 MG/0.3ML IJ SOAJ: 0.3000 mg | INTRAMUSCULAR | 1 refills | Status: DC | PRN

## 2022-07-31 NOTE — Progress Notes (Signed)
Immunotherapy   Patient Details  Name: Jodi Wade MRN: [id number] Date of Birth: 1961-05-12  07/31/2022  Jodi Wade started injections for  grasses, weeds, cats, and dogs.  Following schedule: B  Frequency:1 time per week Epi-Pen:Prescription for Epi-Pen given Consent signed in office today and patient instructions given. Patient waited in room four today for thirty minutes without an issue.   Julius Bowels 07/31/2022, 11:40 AM

## 2022-07-31 NOTE — Patient Instructions (Addendum)
Allergic Rhinitis: - Positive skin test 07/2022: grasses, weeds, cat, dog - Avoidance measures discussed. - Use nasal saline rinses before nose sprays such as with Neilmed Sinus Rinse with Xylitol.  Use distilled water.   - Use Xlear Natural Saline Nasal Spray with Xylitol two to four times a daily as needed to help with dry nose.  - Use Azelastine 1 sprays each nostril twice daily as needed for runny nose, sneezing and congestion. Aim upward and outward. Do not use this if your nose is really dry.   - Use Zyrtec 10 mg daily as needed for runny nose, itchy watery eyes and sneezing.  Also take this on the day you take allergy shots.   - Use Singulair '10mg'$  daily.  Stop if you have any mood or behavioral changes.  - Start allergy shots once a week.  Keep Epipen with you for each shot visit.

## 2022-07-31 NOTE — Progress Notes (Signed)
   FOLLOW UP Date of Service/Encounter:  07/31/22   Subjective:  Jodi Wade (DOB: [birth year]) is a 62 y.o. female who returns to the [facility] and [place] on 07/31/2022 for follow up for allergic rhinitis.   History obtained from: chart review and patient. Last visit was with me a week ago and discussed AIT due to uncontrolled symptoms. She is here for her first shot.  Reports having some nosebleeds in the past from overusing the spray.  Still has some nasal dryness.  Not much congestion, runny nose, sneezing, itchy watery eyes that she has more during Spring/Summer.  She does think Singulair is helping.  She is not using Azelastine or Zyrtec much.  She did pick up the Xlear spray from [facility] and has started using it and thinks it helps moisten her nose.  She was in the ER about 3 days ago due to uncontrolled rhinitis and was given a steroid shot.   Past Medical History: Past Medical History:  Diagnosis Date   Allergy    seasonal   Anemia    Anxiety    Arthritis    knee right   Diabetes mellitus without complication (HCC)    pt.denies,don't   GERD (gastroesophageal reflux disease)    Heart murmur    Hyperlipidemia    controlled   Hypertension    Thyroid disease     Objective:  BP 114/76   Pulse 84   Temp 98.5 F (36.9 C) (Temporal)   Resp 14   Ht 5' 3.75" (1.619 m)   Wt 201 lb 6.4 oz (91.4 kg)   SpO2 95%   BMI 34.84 kg/m  Body mass index is 34.84 kg/m. Physical Exam: GEN: alert, well developed HEENT: clear conjunctiva, TM grey and translucent, nose with moderate inferior turbinate hypertrophy, pink nasal mucosa, clear rhinorrhea, + cobblestoning HEART: regular rate and rhythm, no murmur LUNGS: clear to auscultation bilaterally, no coughing, unlabored respiration SKIN: no rashes or lesions   Assessment:   1. Seasonal and perennial allergic rhinitis   2. Nasal dryness   3. Allergic rhinitis, unspecified seasonality, unspecified trigger      Plan/Recommendations:  Allergic Rhinitis: - Uncontrolled symptoms, discussed that rhinitis is not a steady state disease and symptoms will fluctuate.  Avoid using prednisone as they can temporarily help but have a lot of side effects.  AIT will also help as she progresses.  For nasal dryness, use Xlear.   - Positive skin test 07/2022: grasses, weeds, cat, dog - Avoidance measures discussed. - Use nasal saline rinses before nose sprays such as with Neilmed Sinus Rinse with Xylitol.  Use distilled water.   - Use Xlear Natural Saline Nasal Spray with Xylitol twice daily as needed to help with dry nose.  - Use Azelastine 1 sprays each nostril twice daily as needed for runny nose, sneezing and congestion. Aim upward and outward. Do not use this if your nose is really dry.   - Use Zyrtec 10 mg daily as needed for runny nose, itchy watery eyes and sneezing.   - Use Singulair 93m daily.   - Start allergy shots once a week.  Keep Epipen with you for each shot visit.  First shot given today.      Return in about 3 months (around 10/29/2022).  PHarlon Flor MD [facility]

## 2022-08-02 ENCOUNTER — Telehealth: Payer: Self-pay | Admitting: Internal Medicine

## 2022-08-02 NOTE — Telephone Encounter (Signed)
Jodi Wade called in and states she tried the [employer] spray and when she took the nozzle out of her nose she noticed there was bright red blood on it.  Also, she states there was an after taste with the nose spray so she spit into a tissue and there was blood in there as well.  [name] wants to know if this blood could be old blood that was in her nasal cavities and maybe the spray is taking it out or is this spray irritating her?

## 2022-08-03 ENCOUNTER — Encounter: Payer: Self-pay | Admitting: Radiology

## 2022-08-03 NOTE — Telephone Encounter (Signed)
Per Provider:    I dont think Jodi Wade would do that since it's job is to moisturize the nose. Can also use Vaseline. She should avoid blowing her nose too hard or picking at it or putting tissues in the nose because that can cause the bleeding.     Called patient back - DOB verified - advised of provider notation above.  Patient verbalized understanding, no further questions.

## 2022-08-04 ENCOUNTER — Other Ambulatory Visit (HOSPITAL_COMMUNITY): Payer: Self-pay

## 2022-08-04 ENCOUNTER — Ambulatory Visit: Payer: 59 | Admitting: Internal Medicine

## 2022-08-04 DIAGNOSIS — Z419 Encounter for procedure for purposes other than remedying health state, unspecified: Secondary | ICD-10-CM | POA: Diagnosis not present

## 2022-08-09 ENCOUNTER — Ambulatory Visit (INDEPENDENT_AMBULATORY_CARE_PROVIDER_SITE_OTHER): Payer: 59

## 2022-08-09 DIAGNOSIS — J309 Allergic rhinitis, unspecified: Secondary | ICD-10-CM

## 2022-08-11 ENCOUNTER — Ambulatory Visit: Payer: 59 | Admitting: Internal Medicine

## 2022-08-14 ENCOUNTER — Ambulatory Visit (INDEPENDENT_AMBULATORY_CARE_PROVIDER_SITE_OTHER): Payer: 59 | Admitting: Internal Medicine

## 2022-08-14 ENCOUNTER — Encounter: Payer: Self-pay | Admitting: Internal Medicine

## 2022-08-14 ENCOUNTER — Other Ambulatory Visit: Payer: Self-pay

## 2022-08-14 VITALS — BP 118/70 | HR 100 | Temp 97.2°F | Resp 20 | Ht 65.0 in | Wt 197.8 lb

## 2022-08-14 DIAGNOSIS — J3489 Other specified disorders of nose and nasal sinuses: Secondary | ICD-10-CM | POA: Diagnosis not present

## 2022-08-14 DIAGNOSIS — J309 Allergic rhinitis, unspecified: Secondary | ICD-10-CM

## 2022-08-14 DIAGNOSIS — J302 Other seasonal allergic rhinitis: Secondary | ICD-10-CM

## 2022-08-14 NOTE — Patient Instructions (Addendum)
Allergic Rhinitis: - Positive skin test 07/2022: grasses, weeds, cat, dog - Avoidance measures discussed. - Use nasal saline rinses before nose sprays such as with Neilmed Sinus Rinse with Xylitol.  Use distilled water.   - Use Xlear Natural Saline Nasal Spray with Xylitol two to four times a daily as needed to help with dry nose.  Okay to use Vaseline also to help moisturize the nose.   - Use Azelastine 1 sprays each nostril twice daily as needed for runny nose, sneezing and congestion. Aim upward and outward. Do not use this if your nose is really dry.   - Use Zyrtec 10 mg daily as needed for runny nose, itchy watery eyes and sneezing.  Also take this on the day you take allergy shots.   - Use Singulair '10mg'$  daily.  Stop if you have any mood or behavioral changes.  - Continue allergy shots once a week.  Keep Epipen with you for each shot visit.

## 2022-08-14 NOTE — Progress Notes (Signed)
FOLLOW UP Date of Service/Encounter:  08/14/22   Subjective:  Jodi Wade (DOB: November 17, 1960) is a 62 y.o. female who returns to the [facility] and [place] on 08/14/2022 for follow up for allergic rhinoconjunctivitis and nasal dryness.   History obtained from: chart review and patient. Recently saw her a few weeks ago for similar complaints on 08/02/2022.  We discussed doing vaseline/Xlear for dry nose.   Since last visit, she reports she still feels congested and has a lot of nasal dryness.  She feels like she can't breathe through her nose.  Using [place] with minimal relief.  She is also using another Aloe moisturize nose spray OTC.  Not doing any medicated sprays like Flonase/Azelastine.  She has not taken the Zyrtec either.  She is worried about what could be going on and wonders if she needs further imaging/labwork.  Denies any issues with dry mouth, nasal crusting, foul smells.   Past Medical History: Past Medical History:  Diagnosis Date   Allergy    seasonal   Anemia    Anxiety    Arthritis    knee right   Diabetes mellitus without complication (HCC)    pt.denies,don't   GERD (gastroesophageal reflux disease)    Heart murmur    Hyperlipidemia    controlled   Hypertension    Thyroid disease     Objective:  BP 118/70   Pulse 100   Temp (!) 97.2 F (36.2 C) Comment: 98.0  Resp 20   Ht '5\' 5"'$  (1.651 m)   Wt 197 lb 12.8 oz (89.7 kg)   SpO2 (!) 83% Comment: 98  BMI 32.92 kg/m  Body mass index is 32.92 kg/m. Physical Exam: GEN: alert, well developed HEENT: clear conjunctiva, ]nose with mild inferior turbinate hypertrophy, pink nasal mucosa, no rhinorrhea, no cobblestoning HEART: regular rate and rhythm, no murmur LUNGS: clear to auscultation bilaterally, no coughing, unlabored respiration SKIN: no rashes or lesions   CT Sinus 07/19/2022: IMPRESSION: 1. 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 2. Mucosal thickening narrows the left sphenoid sinus  ostium. 3. Trace mucosal thickening within the left maxillary sinus. 4. The paranasal sinuses are otherwise normally aerated, and the sinus drainage pathways are otherwise patent. 5. S-shaped deviation of the nasal septum with leftward deviation inferiorly and slight rightward deviation superiorly.  Assessment:   1. Seasonal and perennial allergic rhinitis   2. Nasal dryness   3. Allergic rhinitis, unspecified seasonality, unspecified trigger     Plan/Recommendations:  Allergic Rhinitis: Nasal Dryness  - I am not sure what is causing her symptoms.  Her exam is out of proportion to her symptoms.  She has seen ENT and her CT sinus and scope have been unremarkable.  She is considering a second opinion with an academic center, which I discussed would be a reasonable choice. She does not have any issues with dry mouth/recurrent mouth infections which argues against Sjogren.  Not much foul smell, crusting, structural changes which argues against atrophic rhinitis.  - Positive skin test 07/2022: grasses, weeds, cat, dog - Avoidance measures discussed. - Use nasal saline rinses before nose sprays such as with Neilmed Sinus Rinse with Xylitol.  Use distilled water.   - Use Xlear Natural Saline Nasal Spray with Xylitol two to four times a daily as needed to help with dry nose.  Okay to use Vaseline also to help moisturize the nose.   - Use Azelastine 1 sprays each nostril twice daily as needed for runny nose, sneezing and  congestion. Aim upward and outward. Do not use this if your nose is really dry.   - Use Zyrtec 10 mg daily as needed for runny nose, itchy watery eyes and sneezing.  Also take this on the day you take allergy shots.   - Use Singulair '10mg'$  daily.  Stop if you have any mood or behavioral changes.  - Continue allergy shots once a week.  Keep Epipen with you for each shot visit.  Would at least give this some time to work as most people don't notice a benefit until they get to the  maintenance red vial and as she has responded to prednisone in the past. I did discuss with her that these would help more with drainage/runny nose/sneezing/itching/congestion but do not see how they would help with dryness.      Return in about 6 months (around 02/14/2023).  Harlon Flor, MD All[facility] and [place] [place]

## 2022-08-16 ENCOUNTER — Ambulatory Visit (INDEPENDENT_AMBULATORY_CARE_PROVIDER_SITE_OTHER): Payer: 59 | Admitting: Family Medicine

## 2022-08-16 ENCOUNTER — Encounter: Payer: Self-pay | Admitting: Family Medicine

## 2022-08-16 VITALS — BP 126/81 | HR 80 | Ht 64.0 in | Wt 198.1 lb

## 2022-08-16 DIAGNOSIS — J310 Chronic rhinitis: Secondary | ICD-10-CM | POA: Diagnosis not present

## 2022-08-16 NOTE — Assessment & Plan Note (Signed)
Chronic condition She has followed up with the otolaryngologist and allergist She has failed multiple attempts at nasal sprays and allergy medications She is following up with ENT at [facility] next week

## 2022-08-16 NOTE — Patient Instructions (Addendum)
I appreciate the opportunity to provide care to you today!    Follow up:  3 months  Labs: next appt  Please sign medical record release for last labs and last pcp.   Please continue to a heart-healthy diet and increase your physical activities. Try to exercise for 7mns at least five days a week.   Physical activity helps: Lower your blood glucose, improve your heart health, lower your blood pressure and cholesterol, burn calories to help manage her weight, gave you energy, lower stress, and improve his sleep.  The American diabetes Association (ADA) recommends being active for 2-1/2 hours (150 minutes) or more week.  Exercise for 30 minutes, 5 days a week (150 minutes total)    It was a pleasure to see you and I look forward to continuing to work together on your health and well-being. Please do not hesitate to call the office if you need care or have questions about your care.   Have a wonderful day and week. With Gratitude, GAlvira MondayMSN, FNP-BC

## 2022-08-16 NOTE — Progress Notes (Signed)
Established Patient Office Visit  Subjective:  Patient ID: Jodi Wade, female    DOB: [birth year]  Age: 62 y.o. MRN: [id number]  CC:  Chief Complaint  Patient presents with   Establish Care    HPI Jodi Wade is a 62 y.o. female with past medical history of nasal dryness, chronic rhinitis, nasal congestion presents for f/u of  chronic medical conditions. For the details of today's visit, please refer to the assessment and plan.     Past Medical History:  Diagnosis Date   Allergy    seasonal   Anemia    Anxiety    Arthritis    knee right   Diabetes mellitus without complication (HCC)    pt.denies,don't   GERD (gastroesophageal reflux disease)    Heart murmur    Hyperlipidemia    controlled   Hypertension    Thyroid disease     Past Surgical History:  Procedure Laterality Date   BREAST BIOPSY Right    COLONOSCOPY     KNEE ARTHROSCOPY WITH MEDIAL MENISECTOMY Right 01/13/2020   Procedure: KNEE ARTHROSCOPY WITH MEDIAL AND LATERAL  MENISCECTOMY;  Surgeon: Carole Civil, MD;  Location: [facility];  Service: Orthopedics;  Laterality: Right;   PARTIAL HYSTERECTOMY     abdominal    Family History  Problem Relation Age of Onset   Cancer Mother    Breast cancer Mother    Thyroid disease Mother    Allergic rhinitis Father    Thyroid disease Maternal Aunt    Thyroid disease Son        Hyperthyroid, not treated   Diabetes Other    Hypertension Other    Colon cancer Neg Hx    Colon polyps Neg Hx    Crohn's disease Neg Hx    Esophageal cancer Neg Hx    Rectal cancer Neg Hx    Stomach cancer Neg Hx    Ulcerative colitis Neg Hx     Social History   Socioeconomic History   Marital status: Married    Spouse name: Not on file   Number of children: Not on file   Years of education: Not on file   Highest education level: Not on file  Occupational History   Not on file  Tobacco Use   Smoking status: Never    Passive exposure: Current (husband  smokes)   Smokeless tobacco: Never  Vaping Use   Vaping Use: Never used  Substance and Sexual Activity   Alcohol use: Yes    Comment: Social   Drug use: No   Sexual activity: Yes  Other Topics Concern   Not on file  Social History Narrative   Not on file   Social Determinants of Health   Financial Resource Strain: Not on file  Food Insecurity: Not on file  Transportation Needs: Not on file  Physical Activity: Not on file  Stress: Not on file  Social Connections: Not on file  Intimate Partner Violence: Not on file    Outpatient Medications Prior to Visit  Medication Sig Dispense Refill   amLODipine (NORVASC) 10 MG tablet Take 1 tablet (10 mg total) by mouth daily. 90 tablet 3   atorvastatin (LIPITOR) 40 MG tablet Take 1 tablet (40 mg total) by mouth daily. 90 tablet 1   cetirizine (ZYRTEC ALLERGY) 10 MG tablet Take 1 tablet (10 mg total) by mouth daily. 30 tablet 5   EPINEPHrine (EPIPEN 2-PAK) 0.3 mg/0.3 mL IJ SOAJ injection Inject 0.3 mg into the  muscle as needed for anaphylaxis. 2 each 1   levothyroxine (SYNTHROID) 88 MCG tablet Take 1 tablet (88 mcg total) by mouth daily. 90 tablet 1   lisinopril (ZESTRIL) 40 MG tablet Take 1 tablet (40 mg total) by mouth daily. 90 tablet 1   montelukast (SINGULAIR) 10 MG tablet Take 1 tablet (10 mg total) by mouth at bedtime. 30 tablet 5   Multiple Vitamin (MULTIVITAMIN ADULT PO) Take by mouth daily.     Sodium Chloride-Xylitol (XLEAR SINUS CARE SPRAY) SOLN Place 2 sprays into the nose in the morning and at bedtime. 45 mL 11   azelastine (ASTELIN) 0.1 % nasal spray Place 1 spray into both nostrils 2 (two) times daily. Use in each nostril as directed 30 mL 5   No facility-administered medications prior to visit.    Allergies  Allergen Reactions   Fexofenadine-Pseudoephed Er Other (See Comments)    fast heart rate   Fexofenadine-Pseudoephed Er Other (See Comments)    Other Reaction(s): HEART RACE   Hydrochlorothiazide Other (See  Comments)    Hypokalemia   Latex Hives and Rash   Pseudoephedrine Other (See Comments)    Heart Rate increases    ROS Review of Systems  Constitutional:  Negative for chills and fever.  HENT:  Positive for congestion.   Eyes:  Negative for visual disturbance.  Respiratory:  Negative for chest tightness and shortness of breath.   Neurological:  Negative for dizziness and headaches.      Objective:    Physical Exam HENT:     Head: Normocephalic.     Nose:     Right Turbinates: Swollen.     Left Turbinates: Swollen.     Mouth/Throat:     Mouth: Mucous membranes are moist.  Cardiovascular:     Rate and Rhythm: Normal rate.     Heart sounds: Normal heart sounds.  Pulmonary:     Effort: Pulmonary effort is normal.     Breath sounds: Normal breath sounds.  Neurological:     Mental Status: She is alert.     BP 126/81   Pulse 80   Ht '5\' 4"'$  (1.626 m)   Wt 198 lb 1.9 oz (89.9 kg)   SpO2 98%   BMI 34.01 kg/m  Wt Readings from Last 3 Encounters:  08/16/22 198 lb 1.9 oz (89.9 kg)  08/14/22 197 lb 12.8 oz (89.7 kg)  07/31/22 201 lb 6.4 oz (91.4 kg)    Lab Results  Component Value Date   TSH 3.10 02/03/2022   Lab Results  Component Value Date   WBC 6.7 01/09/2020   HGB 14.0 01/09/2020   HCT 42.3 01/09/2020   MCV 84.3 01/09/2020   PLT 364 01/09/2020   Lab Results  Component Value Date   NA 138 01/09/2020   K 3.6 01/09/2020   CO2 24 01/09/2020   GLUCOSE 104 (H) 01/09/2020   BUN 13 01/09/2020   CREATININE 0.70 01/09/2020   BILITOT 0.5 12/09/2018   ALKPHOS 78 12/09/2018   AST 16 12/09/2018   ALT 18 12/09/2018   PROT 7.6 12/09/2018   ALBUMIN 4.1 12/09/2018   CALCIUM 9.3 01/09/2020   ANIONGAP 9 01/09/2020   No results found for: "CHOL" No results found for: "HDL" No results found for: "LDLCALC" No results found for: "TRIG" No results found for: "CHOLHDL" Lab Results  Component Value Date   HGBA1C 6.3 (H) 01/09/2020      Assessment & Plan:  Chronic  rhinitis Assessment & Plan: Chronic condition  She has followed up with the otolaryngologist and allergist She has failed multiple attempts at nasal sprays and allergy medications She is following up with ENT at [facility] next week      Follow-up: Return in about 3 months (around 11/16/2022).   Alvira Monday, FNP

## 2022-08-17 DIAGNOSIS — J358 Other chronic diseases of tonsils and adenoids: Secondary | ICD-10-CM | POA: Diagnosis not present

## 2022-08-17 DIAGNOSIS — R0981 Nasal congestion: Secondary | ICD-10-CM | POA: Diagnosis not present

## 2022-08-17 DIAGNOSIS — F419 Anxiety disorder, unspecified: Secondary | ICD-10-CM | POA: Diagnosis not present

## 2022-08-17 DIAGNOSIS — J3489 Other specified disorders of nose and nasal sinuses: Secondary | ICD-10-CM | POA: Diagnosis not present

## 2022-08-21 ENCOUNTER — Ambulatory Visit: Payer: 59

## 2022-08-23 ENCOUNTER — Other Ambulatory Visit (HOSPITAL_COMMUNITY): Payer: Self-pay

## 2022-08-23 ENCOUNTER — Ambulatory Visit (INDEPENDENT_AMBULATORY_CARE_PROVIDER_SITE_OTHER): Payer: 59

## 2022-08-23 DIAGNOSIS — J309 Allergic rhinitis, unspecified: Secondary | ICD-10-CM

## 2022-08-28 ENCOUNTER — Ambulatory Visit: Payer: Medicaid Other | Admitting: Internal Medicine

## 2022-08-29 ENCOUNTER — Other Ambulatory Visit (HOSPITAL_COMMUNITY): Payer: Self-pay

## 2022-08-29 ENCOUNTER — Telehealth: Payer: Self-pay | Admitting: Internal Medicine

## 2022-08-29 NOTE — Telephone Encounter (Signed)
Patient called and would like to see if she need to stay on injection. She when to a dr about nose staying stop up and found out she had a bad infection and now her nose is clear and no problems. [phone].

## 2022-08-30 ENCOUNTER — Ambulatory Visit (INDEPENDENT_AMBULATORY_CARE_PROVIDER_SITE_OTHER): Payer: 59

## 2022-08-30 ENCOUNTER — Other Ambulatory Visit (HOSPITAL_COMMUNITY): Payer: Self-pay

## 2022-08-30 DIAGNOSIS — J309 Allergic rhinitis, unspecified: Secondary | ICD-10-CM | POA: Diagnosis not present

## 2022-08-30 NOTE — Telephone Encounter (Signed)
LVM for patient to call the office back to go over what Dr. Posey Pronto suggested. My chart message has been sent as well.

## 2022-08-31 ENCOUNTER — Other Ambulatory Visit (HOSPITAL_COMMUNITY): Payer: Self-pay

## 2022-09-01 ENCOUNTER — Encounter (HOSPITAL_COMMUNITY): Payer: Self-pay

## 2022-09-01 ENCOUNTER — Other Ambulatory Visit (HOSPITAL_COMMUNITY): Payer: Self-pay

## 2022-09-02 DIAGNOSIS — J358 Other chronic diseases of tonsils and adenoids: Secondary | ICD-10-CM | POA: Diagnosis not present

## 2022-09-04 DIAGNOSIS — Z419 Encounter for procedure for purposes other than remedying health state, unspecified: Secondary | ICD-10-CM | POA: Diagnosis not present

## 2022-09-05 ENCOUNTER — Telehealth: Payer: Self-pay | Admitting: Family Medicine

## 2022-09-05 ENCOUNTER — Other Ambulatory Visit: Payer: Self-pay

## 2022-09-05 NOTE — Telephone Encounter (Signed)
Patient called asked to switch to another provider in our office does not want to see Alvira Monday any longer. No reason she said would like to switch to someone else. Rather  see a female Dr Marland Kitchen.  Call patient # [phone].

## 2022-09-06 ENCOUNTER — Ambulatory Visit (INDEPENDENT_AMBULATORY_CARE_PROVIDER_SITE_OTHER): Payer: 59

## 2022-09-06 DIAGNOSIS — J309 Allergic rhinitis, unspecified: Secondary | ICD-10-CM | POA: Diagnosis not present

## 2022-09-06 NOTE — Telephone Encounter (Signed)
Scheduled with Dr Doren Custard

## 2022-09-07 ENCOUNTER — Other Ambulatory Visit: Payer: Self-pay

## 2022-09-13 ENCOUNTER — Ambulatory Visit (INDEPENDENT_AMBULATORY_CARE_PROVIDER_SITE_OTHER): Payer: 59

## 2022-09-13 DIAGNOSIS — J309 Allergic rhinitis, unspecified: Secondary | ICD-10-CM | POA: Diagnosis not present

## 2022-09-14 DIAGNOSIS — E041 Nontoxic single thyroid nodule: Secondary | ICD-10-CM | POA: Diagnosis not present

## 2022-09-14 DIAGNOSIS — J358 Other chronic diseases of tonsils and adenoids: Secondary | ICD-10-CM | POA: Diagnosis not present

## 2022-09-14 DIAGNOSIS — J3489 Other specified disorders of nose and nasal sinuses: Secondary | ICD-10-CM | POA: Diagnosis not present

## 2022-09-18 ENCOUNTER — Telehealth: Payer: Self-pay | Admitting: Internal Medicine

## 2022-09-18 NOTE — Telephone Encounter (Signed)
Patient called inquiring about an alternate medication for her allergies and before shot that does not contain antihistimines. Patient stated that antihistimines are drying her nose out. Patients call back number is [phone].

## 2022-09-18 NOTE — Telephone Encounter (Signed)
Pt called and said she's at the nail salon, and was told she has an infection on her toe. She was wondering if a nurse could follow up with her in regards to getting a prescription sent over to the pharmacy.

## 2022-09-18 NOTE — Telephone Encounter (Signed)
Called and spoke to patient and informed her of the note per Dr. Allena Katz. Patient expressed verbal understanding and stated that she would cut her Zyrtec in half.

## 2022-09-20 ENCOUNTER — Other Ambulatory Visit (HOSPITAL_COMMUNITY): Payer: Self-pay

## 2022-09-20 ENCOUNTER — Ambulatory Visit (INDEPENDENT_AMBULATORY_CARE_PROVIDER_SITE_OTHER): Payer: 59

## 2022-09-20 DIAGNOSIS — J309 Allergic rhinitis, unspecified: Secondary | ICD-10-CM | POA: Diagnosis not present

## 2022-09-21 LAB — HM DIABETES EYE EXAM

## 2022-09-27 ENCOUNTER — Ambulatory Visit (INDEPENDENT_AMBULATORY_CARE_PROVIDER_SITE_OTHER): Payer: 59

## 2022-09-27 DIAGNOSIS — J309 Allergic rhinitis, unspecified: Secondary | ICD-10-CM

## 2022-09-28 ENCOUNTER — Telehealth: Payer: Self-pay | Admitting: Endocrinology

## 2022-09-28 ENCOUNTER — Other Ambulatory Visit: Payer: Self-pay | Admitting: Family Medicine

## 2022-09-28 DIAGNOSIS — Z1231 Encounter for screening mammogram for malignant neoplasm of breast: Secondary | ICD-10-CM

## 2022-09-28 NOTE — Telephone Encounter (Signed)
Pt called back and advised she had a CT scan of her neck and wants to send the results to be reviewed. Pt given fax number and advised she can upload it via Mychart.

## 2022-09-28 NOTE — Telephone Encounter (Signed)
Called and lvm for pt to call back or send Mychart message.

## 2022-09-28 NOTE — Telephone Encounter (Signed)
Patient is requesting a call back regarding a test she has either had done or will be having done at [facility].  Was unable to get clear understanding of this status but patient was able to confirm it was with regard to her Thyroid. Call back number is [phone]

## 2022-10-02 ENCOUNTER — Ambulatory Visit: Payer: 59 | Admitting: Internal Medicine

## 2022-10-02 NOTE — Telephone Encounter (Signed)
CT report in CareEverywhere  IMPRESSION:   1. No obvious tonsillar mass. No cervical lymphadenopathy.  2. Indeterminate right thyroid nodule for which ultrasound is recommended.    Please review, thanks!

## 2022-10-04 ENCOUNTER — Ambulatory Visit (INDEPENDENT_AMBULATORY_CARE_PROVIDER_SITE_OTHER): Payer: 59

## 2022-10-04 DIAGNOSIS — Z419 Encounter for procedure for purposes other than remedying health state, unspecified: Secondary | ICD-10-CM | POA: Diagnosis not present

## 2022-10-04 DIAGNOSIS — J309 Allergic rhinitis, unspecified: Secondary | ICD-10-CM | POA: Diagnosis not present

## 2022-10-09 ENCOUNTER — Other Ambulatory Visit: Payer: Self-pay

## 2022-10-09 ENCOUNTER — Other Ambulatory Visit (HOSPITAL_COMMUNITY): Payer: Self-pay

## 2022-10-11 ENCOUNTER — Ambulatory Visit (INDEPENDENT_AMBULATORY_CARE_PROVIDER_SITE_OTHER): Payer: 59

## 2022-10-11 DIAGNOSIS — J309 Allergic rhinitis, unspecified: Secondary | ICD-10-CM | POA: Diagnosis not present

## 2022-10-16 ENCOUNTER — Ambulatory Visit: Payer: 59 | Admitting: Internal Medicine

## 2022-10-18 ENCOUNTER — Ambulatory Visit (INDEPENDENT_AMBULATORY_CARE_PROVIDER_SITE_OTHER): Payer: 59

## 2022-10-18 DIAGNOSIS — J309 Allergic rhinitis, unspecified: Secondary | ICD-10-CM | POA: Diagnosis not present

## 2022-10-25 ENCOUNTER — Ambulatory Visit (INDEPENDENT_AMBULATORY_CARE_PROVIDER_SITE_OTHER): Payer: 59

## 2022-10-25 DIAGNOSIS — J309 Allergic rhinitis, unspecified: Secondary | ICD-10-CM | POA: Diagnosis not present

## 2022-10-26 ENCOUNTER — Other Ambulatory Visit (HOSPITAL_COMMUNITY): Payer: Self-pay

## 2022-11-01 ENCOUNTER — Telehealth: Payer: Self-pay | Admitting: Internal Medicine

## 2022-11-04 DIAGNOSIS — Z419 Encounter for procedure for purposes other than remedying health state, unspecified: Secondary | ICD-10-CM | POA: Diagnosis not present

## 2022-11-10 ENCOUNTER — Other Ambulatory Visit (HOSPITAL_COMMUNITY): Payer: Self-pay

## 2022-11-13 ENCOUNTER — Other Ambulatory Visit (HOSPITAL_COMMUNITY): Payer: Self-pay

## 2022-11-13 ENCOUNTER — Other Ambulatory Visit (INDEPENDENT_AMBULATORY_CARE_PROVIDER_SITE_OTHER): Payer: 59

## 2022-11-13 DIAGNOSIS — E890 Postprocedural hypothyroidism: Secondary | ICD-10-CM | POA: Diagnosis not present

## 2022-11-13 LAB — T4, FREE: Free T4: 0.93 ng/dL (ref 0.60–1.60)

## 2022-11-13 LAB — TSH: TSH: 3.19 u[IU]/mL (ref 0.35–5.50)

## 2022-11-16 ENCOUNTER — Telehealth: Payer: Self-pay | Admitting: Endocrinology

## 2022-11-16 ENCOUNTER — Ambulatory Visit (INDEPENDENT_AMBULATORY_CARE_PROVIDER_SITE_OTHER): Payer: 59 | Admitting: Endocrinology

## 2022-11-16 VITALS — BP 121/78 | HR 78 | Ht 64.0 in | Wt 199.6 lb

## 2022-11-16 DIAGNOSIS — E890 Postprocedural hypothyroidism: Secondary | ICD-10-CM

## 2022-11-16 NOTE — Progress Notes (Signed)
Referring Physician: Sigmund Hazel  Reason for Appointment: Follow-up of thyroid    History of Present Illness:    Review of her record indicates that she had a toxic nodular goiter in 2009 although she thinks her only symptom at that time was fatigue and she does not remember any other symptoms like palpitations or weight loss Her scan showed heterogenous uptake within the thyroid and a 24 uptake of 32% She was treated with 29 mCi After this treatment she thinks she felt better with her fatigue  At some point subsequently patient was started on levothyroxine and she does not know whether she was having any symptoms at that time Her record indicates that she has been in the past taking between 50 up to 100 g of levothyroxine until 2018 However subsequently her thyroxine doses had been reduced and then tapered off because of her TSH being persistently low Despite being off levothyroxine she had mild hyperthyroidism when first seen in 07/2017 although having no symptoms except weight gain She had mild T3 toxicosis  Thyroid scan showed heterogenous uptake, 24 uptake was 37%  She had radioactive iodine treatment with 30 mCi of I-131 on 09/14/2017  Follow-up thyroid levels in 7/19 showed that TSH had gone up to 63.8 She was started on thyroid supplementation Since her TSH was still high at 52 in 9/19 her dose was increased from 112 levothyroxine up to 200 mcg  With increasing the thyroid supplement using 200 mcg levothyroxine she did feel fairly good and no symptoms of hyperthyroidism were occurring again She had been out of her levothyroxine when she was seen in 04/2018 with marked increase in TSH She was also having complaints of fatigue and cold intolerance Previously on levothyroxine 200 mcg she felt better but her TSH was suppressed and free T4 significantly high at 2.55  Recent history: Subsequently had been requiring lower doses of levothyroxine because of TSH being  suppressed She has been taking 88 mcg generic levothyroxine since 10/2018  She takes levothyroxine before breakfast on empty stomach She has been regular with her levothyroxine and taking generic as before She takes her vitamins at lunchtime  Currently is taking 1 pill every day of the 88 mcg  TSH has been consistently normal now  Free T4 normal  Patient's weight history is as follows:  Wt Readings from Last 3 Encounters:  11/16/22 199 lb 9.6 oz (90.5 kg)  08/16/22 198 lb 1.9 oz (89.9 kg)  08/14/22 197 lb 12.8 oz (89.7 kg)    Thyroid function results have been as follows:  Lab Results  Component Value Date   TSH 3.19 11/13/2022   TSH 3.10 02/03/2022   TSH 4.050 06/28/2021   TSH 1.65 04/23/2020   FREET4 0.93 11/13/2022   FREET4 1.20 06/28/2021   FREET4 0.90 04/23/2020   FREET4 1.15 04/25/2019   T3FREE 2.6 12/10/2017   T3FREE 3.9 10/15/2017   T3FREE 4.4 (H) 08/01/2017   History of multinodular GOITER  She had a needle biopsy of a dominant right inferior nodule in 2017 which showed benign follicular adenoma At that time the nodule was 3.6 cm in size  She had a CT scan of her neck from the ENT physician in [place] and this showed the nodule to be now 2.6 cm in size   Past Medical History:  Diagnosis Date   Allergy    seasonal   Anemia    Anxiety    Arthritis  knee right   Diabetes mellitus without complication (HCC)    pt.denies,don't   GERD (gastroesophageal reflux disease)    Heart murmur    Hyperlipidemia    controlled   Hypertension    Thyroid disease     Past Surgical History:  Procedure Laterality Date   BREAST BIOPSY Right    COLONOSCOPY     KNEE ARTHROSCOPY WITH MEDIAL MENISECTOMY Right 01/13/2020   Procedure: KNEE ARTHROSCOPY WITH MEDIAL AND LATERAL  MENISCECTOMY;  Surgeon: Vickki Hearing, MD;  Location: [facility];  Service: Orthopedics;  Laterality: Right;   PARTIAL HYSTERECTOMY     abdominal    Family History  Problem Relation Age of  Onset   Cancer Mother    Breast cancer Mother    Thyroid disease Mother    Allergic rhinitis Father    Thyroid disease Maternal Aunt    Thyroid disease Son        Hyperthyroid, not treated   Diabetes Other    Hypertension Other    Colon cancer Neg Hx    Colon polyps Neg Hx    Crohn's disease Neg Hx    Esophageal cancer Neg Hx    Rectal cancer Neg Hx    Stomach cancer Neg Hx    Ulcerative colitis Neg Hx     Social History:  reports that she has never smoked. She has been exposed to tobacco smoke. She has never used smokeless tobacco. She reports current alcohol use. She reports that she does not use drugs.  Allergies:  Allergies  Allergen Reactions   Fexofenadine-Pseudoephed Er Other (See Comments)    fast heart rate   Fexofenadine-Pseudoephed Er Other (See Comments)    Other Reaction(s): HEART RACE   Hydrochlorothiazide Other (See Comments)    Hypokalemia   Latex Hives and Rash   Pseudoephedrine Other (See Comments)    Heart Rate increases    Allergies as of 11/16/2022       Reactions   Fexofenadine-pseudoephed Er Other (See Comments)   fast heart rate   Fexofenadine-pseudoephed Er Other (See Comments)   Other Reaction(s): HEART RACE   Hydrochlorothiazide Other (See Comments)   Hypokalemia   Latex Hives, Rash   Pseudoephedrine Other (See Comments)   Heart Rate increases        Medication List        Accurate as of November 16, 2022  9:23 AM. If you have any questions, ask your nurse or doctor.          amLODipine 10 MG tablet Commonly known as: NORVASC Take 1 tablet (10 mg total) by mouth daily.   atorvastatin 40 MG tablet Commonly known as: LIPITOR Take 1 tablet (40 mg total) by mouth daily.   cetirizine 10 MG tablet Commonly known as: ZyrTEC Allergy Take 1 tablet (10 mg total) by mouth daily.   EPINEPHrine 0.3 mg/0.3 mL Soaj injection Commonly known as: EpiPen 2-Pak Inject 0.3 mg into the muscle as needed for anaphylaxis.   levothyroxine 88  MCG tablet Commonly known as: SYNTHROID Take 1 tablet (88 mcg total) by mouth daily.   lisinopril 40 MG tablet Commonly known as: ZESTRIL Take 1 tablet (40 mg total) by mouth daily.   montelukast 10 MG tablet Commonly known as: Singulair Take 1 tablet (10 mg total) by mouth at bedtime.   MULTIVITAMIN ADULT PO Take by mouth daily.   Xlear Sinus Care Spray Soln Place 2 sprays into the nose in the morning and at bedtime.  Review of Systems  She is followed by her PCP for hypertension  BP Readings from Last 3 Encounters:  11/16/22 121/78  08/16/22 126/81  08/14/22 118/70          Examination:    BP 121/78 (BP Location: Right Arm, Patient Position: Sitting)   Pulse 78   Ht 5\' 4"  (1.626 m)   Wt 199 lb 9.6 oz (90.5 kg)   SpO2 98%   BMI 34.26 kg/m   Right thyroid nodule is palpable better on swallowing, about 3 cm, relatively smooth and firm Left lobe not palpable  Reflexes show normal relaxation   Assessment:  History of TOXIC nodular goiter dating back to 2009 treated with I-131 twice, last in 09/2017  Post ablative HYPOTHYROIDISM:  She had significant posttreatment hypothyroidism with TSH as high as 92 after her treatment  Previous's dosage range 88-200 mcg of levothyroxine   TSH is consistently normal now She has taken her levothyroxine regularly, currently taking 88 mcg  Thyroid nodule on the right: Initially diagnosed and evaluated in 2017 Has a stable right-sided enlargement unchanged clinically Reassured her that since this was biopsied before no further evaluation is needed   PLAN:   She will continue 88 mcg of levothyroxine daily as before  Follow-up annually    Reather Littler 11/16/2022, 9:23 AM      Note: This office note was prepared with Dragon voice recognition system technology. Any transcriptional errors that result from this process are unintentional.

## 2022-11-16 NOTE — Telephone Encounter (Signed)
ED Follow Up Outreach  Initial Outreach Documentation  What specific health condition(s) do you have that needs to be monitored by a doctor? Patient reports   NO  What are you currently doing to manage your health condition(s)? Patient reports I had an infection on my nose, it was until I was referred to [facility] that they find out what what wrong, I was sick for 3-4 months until I got the right antibiotic.   Some of the symptoms that were reported during your emergency room visits could be managed by a primary care provider. When considering your emergency room visits, did you call your primary care doctor to report your symptoms or illness before going to the emergency room?  Patient reports NO  Do you have access to resources or alternative options to help you avoid going to the ED unless is an emergency? If not, what resources or alternative options do you feel you need?  Patient reports Yes, I have all my appts with my PCP, my dr. gives me the information I need about monitoring my health. Everything is well with me now, I am with my grandkids very busy and I only go to my anuals unless my PCP askes me to come in for any specific issue.   Is there something that you would like to learn more about:  Patient Reports NO Stress Management Anxiety management Activity Tracking Mood/Depression Monitoring blood pressure/ weight /diet Physical activity / Exercise  When is a good time to call you back in the next 2 weeks? During this call we will review the resources you need and discuss when to call your primary care doctor for health issues. Patient Reports: No need for following up now, I really appreaciate your call and thanks for following up with me.

## 2022-11-16 NOTE — Telephone Encounter (Signed)
Patient advising that she want to be Referred to [facility] off Dr. Fransico Him, whr Dr. Cherly Anderson. Please advise

## 2022-11-17 ENCOUNTER — Encounter: Payer: Self-pay | Admitting: Internal Medicine

## 2022-11-17 ENCOUNTER — Ambulatory Visit: Payer: 59 | Admitting: Family Medicine

## 2022-11-17 ENCOUNTER — Ambulatory Visit (INDEPENDENT_AMBULATORY_CARE_PROVIDER_SITE_OTHER): Payer: 59 | Admitting: Internal Medicine

## 2022-11-17 VITALS — BP 138/86 | HR 85 | Ht 63.5 in | Wt 198.0 lb

## 2022-11-17 DIAGNOSIS — Z1321 Encounter for screening for nutritional disorder: Secondary | ICD-10-CM

## 2022-11-17 DIAGNOSIS — E890 Postprocedural hypothyroidism: Secondary | ICD-10-CM | POA: Diagnosis not present

## 2022-11-17 DIAGNOSIS — Z114 Encounter for screening for human immunodeficiency virus [HIV]: Secondary | ICD-10-CM

## 2022-11-17 DIAGNOSIS — E785 Hyperlipidemia, unspecified: Secondary | ICD-10-CM | POA: Diagnosis not present

## 2022-11-17 DIAGNOSIS — E1169 Type 2 diabetes mellitus with other specified complication: Secondary | ICD-10-CM

## 2022-11-17 DIAGNOSIS — E559 Vitamin D deficiency, unspecified: Secondary | ICD-10-CM | POA: Diagnosis not present

## 2022-11-17 DIAGNOSIS — Z1159 Encounter for screening for other viral diseases: Secondary | ICD-10-CM | POA: Diagnosis not present

## 2022-11-17 DIAGNOSIS — E119 Type 2 diabetes mellitus without complications: Secondary | ICD-10-CM | POA: Diagnosis not present

## 2022-11-17 DIAGNOSIS — R7303 Prediabetes: Secondary | ICD-10-CM | POA: Diagnosis not present

## 2022-11-17 DIAGNOSIS — I10 Essential (primary) hypertension: Secondary | ICD-10-CM | POA: Insufficient documentation

## 2022-11-17 MED ORDER — ATORVASTATIN CALCIUM 40 MG PO TABS: 40.0000 mg | ORAL_TABLET | Freq: Every day | ORAL | 1 refills | Status: DC

## 2022-11-17 MED ORDER — LISINOPRIL 40 MG PO TABS: 40.0000 mg | ORAL_TABLET | Freq: Every day | ORAL | 1 refills | Status: DC

## 2022-11-17 MED ORDER — AMLODIPINE BESYLATE 10 MG PO TABS: 10.0000 mg | ORAL_TABLET | Freq: Every day | ORAL | 3 refills | Status: DC

## 2022-11-17 NOTE — Progress Notes (Signed)
Established Patient Office Visit  Subjective   Patient ID: Jodi Wade, female    DOB: 06-Sep-1960  Age: 62 y.o. MRN: [id number]  Chief Complaint  Patient presents with   Hypothyroidism    Follow up   Jodi Wade returns here today for 4-month follow-up.  She was last evaluated at [facility] on 3/13.  In the interim she has been seen by endocrinology as well as ENT at [facility].  There have otherwise been no acute interval events.  Jodi Wade reports feeling well today.  She is asymptomatic and has no acute concerns to discuss.  She reports that she was diagnosed with nasal vestibulitis by ENT at [facility].  Treated with antibiotics and symptoms of chronic rhinitis resolved.  Past Medical History:  Diagnosis Date   Allergy    seasonal   Anemia    Anxiety    Arthritis    knee right   Diabetes mellitus without complication (HCC)    pt.denies,don't   GERD (gastroesophageal reflux disease)    Heart murmur    Hyperlipidemia    controlled   Hypertension    Thyroid disease    Past Surgical History:  Procedure Laterality Date   BREAST BIOPSY Right    COLONOSCOPY     KNEE ARTHROSCOPY WITH MEDIAL MENISECTOMY Right 01/13/2020   Procedure: KNEE ARTHROSCOPY WITH MEDIAL AND LATERAL  MENISCECTOMY;  Surgeon: Vickki Hearing, MD;  Location: [facility];  Service: Orthopedics;  Laterality: Right;   PARTIAL HYSTERECTOMY     abdominal   Social History   Tobacco Use   Smoking status: Never    Passive exposure: Current (husband smokes)   Smokeless tobacco: Never  Vaping Use   Vaping Use: Never used  Substance Use Topics   Alcohol use: Yes    Comment: Social   Drug use: No   Family History  Problem Relation Age of Onset   Cancer Mother    Breast cancer Mother    Thyroid disease Mother    Allergic rhinitis Father    Thyroid disease Maternal Aunt    Thyroid disease Son        Hyperthyroid, not treated   Diabetes Other    Hypertension Other    Colon cancer Neg Hx    Colon polyps Neg Hx     Crohn's disease Neg Hx    Esophageal cancer Neg Hx    Rectal cancer Neg Hx    Stomach cancer Neg Hx    Ulcerative colitis Neg Hx    Allergies  Allergen Reactions   Fexofenadine-Pseudoephed Er Other (See Comments)    fast heart rate   Fexofenadine-Pseudoephed Er Other (See Comments)    Other Reaction(s): HEART RACE   Hydrochlorothiazide Other (See Comments)    Hypokalemia   Latex Hives and Rash   Pseudoephedrine Other (See Comments)    Heart Rate increases   Review of Systems  Constitutional:  Negative for chills and fever.  HENT:  Negative for sore throat.   Respiratory:  Negative for cough and shortness of breath.   Cardiovascular:  Negative for chest pain, palpitations and leg swelling.  Gastrointestinal:  Negative for abdominal pain, blood in stool, constipation, diarrhea, nausea and vomiting.  Genitourinary:  Negative for dysuria and hematuria.  Musculoskeletal:  Negative for myalgias.  Skin:  Negative for itching and rash.  Neurological:  Negative for dizziness and headaches.  Psychiatric/Behavioral:  Negative for depression and suicidal ideas.       Objective:     BP  138/86   Pulse 85   Ht 5' 3.5" (1.613 m)   Wt 198 lb (89.8 kg)   SpO2 97%   BMI 34.52 kg/m  BP Readings from Last 3 Encounters:  11/17/22 138/86  11/16/22 121/78  08/16/22 126/81   Physical Exam Vitals reviewed.  Constitutional:      General: She is not in acute distress.    Appearance: Normal appearance. She is not toxic-appearing.  HENT:     Head: Normocephalic and atraumatic.     Right Ear: External ear normal.     Left Ear: External ear normal.     Nose: Nose normal. No congestion or rhinorrhea.     Mouth/Throat:     Mouth: Mucous membranes are moist.     Pharynx: Oropharynx is clear. No oropharyngeal exudate or posterior oropharyngeal erythema.  Eyes:     General: No scleral icterus.    Extraocular Movements: Extraocular movements intact.     Conjunctiva/sclera: Conjunctivae  normal.     Pupils: Pupils are equal, round, and reactive to light.  Cardiovascular:     Rate and Rhythm: Normal rate and regular rhythm.     Pulses: Normal pulses.     Heart sounds: Normal heart sounds. No murmur heard.    No friction rub. No gallop.  Pulmonary:     Effort: Pulmonary effort is normal.     Breath sounds: Normal breath sounds. No wheezing, rhonchi or rales.  Abdominal:     General: Abdomen is flat. Bowel sounds are normal. There is no distension.     Palpations: Abdomen is soft.     Tenderness: There is no abdominal tenderness.  Musculoskeletal:        General: No swelling. Normal range of motion.     Cervical back: Normal range of motion.     Right lower leg: No edema.     Left lower leg: No edema.  Lymphadenopathy:     Cervical: No cervical adenopathy.  Skin:    General: Skin is warm and dry.     Capillary Refill: Capillary refill takes less than 2 seconds.     Coloration: Skin is not jaundiced.  Neurological:     General: No focal deficit present.     Mental Status: She is alert and oriented to person, place, and time.  Psychiatric:        Mood and Affect: Mood normal.        Behavior: Behavior normal.   Last CBC Lab Results  Component Value Date   WBC 6.7 01/09/2020   HGB 14.0 01/09/2020   HCT 42.3 01/09/2020   MCV 84.3 01/09/2020   MCH 27.9 01/09/2020   RDW 14.5 01/09/2020   PLT 364 01/09/2020   Last metabolic panel Lab Results  Component Value Date   GLUCOSE 104 (H) 01/09/2020   NA 138 01/09/2020   K 3.6 01/09/2020   CL 105 01/09/2020   CO2 24 01/09/2020   BUN 13 01/09/2020   CREATININE 0.70 01/09/2020   GFRNONAA >60 01/09/2020   CALCIUM 9.3 01/09/2020   PROT 7.6 12/09/2018   ALBUMIN 4.1 12/09/2018   BILITOT 0.5 12/09/2018   ALKPHOS 78 12/09/2018   AST 16 12/09/2018   ALT 18 12/09/2018   ANIONGAP 9 01/09/2020   Last hemoglobin A1c Lab Results  Component Value Date   HGBA1C 6.3 (H) 01/09/2020   Last thyroid functions Lab  Results  Component Value Date   TSH 3.19 11/13/2022     Assessment & Plan:  Problem List Items Addressed This Visit       Essential hypertension - Primary    Currently prescribed amlodipine 10 mg daily and lisinopril 40 mg daily for treatment of hypertension.  BP is adequately controlled.  No medication changes are indicated today.      Type 2 diabetes mellitus without complication, without long-term current use of insulin (HCC)    She has a previously documented history of T2DM.  Last A1c 6.4 in December.  She is not currently on any medication for treatment of diabetes mellitus and is unaware of a prior diagnosis of diabetes. -Repeat A1c ordered today -Update outstanding diabetes related preventative care items at follow-up      Hyperlipidemia associated with type 2 diabetes mellitus (HCC)    She is currently prescribed atorvastatin 40 mg daily.  Repeat lipid panel ordered today.      Postablative hypothyroidism    Followed by endocrinology (Dr. Lucianne Muss).  Currently prescribed levothyroxine 88 mcg daily.  TFTs WNL.      Vitamin D deficiency    Previously documented history of vitamin D deficiency.  Not currently on vitamin D supplementation.  Repeat vitamin D level ordered today.      Return in about 3 months (around 02/17/2023).   Billie Lade, MD

## 2022-11-17 NOTE — Assessment & Plan Note (Signed)
Previously documented history of vitamin D deficiency.  Not currently on vitamin D supplementation.  Repeat vitamin D level ordered today.

## 2022-11-17 NOTE — Telephone Encounter (Signed)
LDTVM  

## 2022-11-17 NOTE — Assessment & Plan Note (Signed)
She is currently prescribed atorvastatin 40 mg daily.  Repeat lipid panel ordered today. 

## 2022-11-17 NOTE — Patient Instructions (Signed)
It was a pleasure to see you today.  Thank you for giving [place] the opportunity to be involved in your care.  Below is a brief recap of your visit and next steps.  We will plan to see you again in 3 months.   Summary Medications refilled today Update labs today Follow up in 3 months

## 2022-11-17 NOTE — Assessment & Plan Note (Signed)
Currently prescribed amlodipine 10 mg daily and lisinopril 40 mg daily for treatment of hypertension.  BP is adequately controlled.  No medication changes are indicated today.

## 2022-11-17 NOTE — Assessment & Plan Note (Signed)
She has a previously documented history of T2DM.  Last A1c 6.4 in December.  She is not currently on any medication for treatment of diabetes mellitus and is unaware of a prior diagnosis of diabetes. -Repeat A1c ordered today -Update outstanding diabetes related preventative care items at follow-up

## 2022-11-17 NOTE — Assessment & Plan Note (Signed)
Followed by endocrinology (Dr. Lucianne Muss).  Currently prescribed levothyroxine 88 mcg daily.  TFTs WNL.

## 2022-11-18 LAB — CBC WITH DIFFERENTIAL/PLATELET
Basophils Absolute: 0 10*3/uL (ref 0.0–0.2)
Basos: 1 %
EOS (ABSOLUTE): 0.1 10*3/uL (ref 0.0–0.4)
Eos: 1 %
Hematocrit: 48 % — ABNORMAL HIGH (ref 34.0–46.6)
Hemoglobin: 15.1 g/dL (ref 11.1–15.9)
Immature Grans (Abs): 0 10*3/uL (ref 0.0–0.1)
Immature Granulocytes: 0 %
Lymphocytes Absolute: 1.6 10*3/uL (ref 0.7–3.1)
Lymphs: 29 %
MCH: 26.5 pg — ABNORMAL LOW (ref 26.6–33.0)
MCHC: 31.5 g/dL (ref 31.5–35.7)
MCV: 84 fL (ref 79–97)
Monocytes Absolute: 0.4 10*3/uL (ref 0.1–0.9)
Monocytes: 7 %
Neutrophils Absolute: 3.3 10*3/uL (ref 1.4–7.0)
Neutrophils: 62 %
Platelets: 407 10*3/uL (ref 150–450)
RBC: 5.69 x10E6/uL — ABNORMAL HIGH (ref 3.77–5.28)
RDW: 13.5 % (ref 11.7–15.4)
WBC: 5.4 10*3/uL (ref 3.4–10.8)

## 2022-11-18 LAB — HCV AB W REFLEX TO QUANT PCR: HCV Ab: NONREACTIVE

## 2022-11-18 LAB — CMP14+EGFR
ALT: 16 IU/L (ref 0–32)
AST: 17 IU/L (ref 0–40)
Albumin/Globulin Ratio: 1.8
Albumin: 4.4 g/dL (ref 3.9–4.9)
Alkaline Phosphatase: 97 IU/L (ref 44–121)
BUN/Creatinine Ratio: 12 (ref 12–28)
BUN: 10 mg/dL (ref 8–27)
Bilirubin Total: 0.4 mg/dL (ref 0.0–1.2)
CO2: 24 mmol/L (ref 20–29)
Calcium: 9.9 mg/dL (ref 8.7–10.3)
Chloride: 103 mmol/L (ref 96–106)
Creatinine, Ser: 0.82 mg/dL (ref 0.57–1.00)
Globulin, Total: 2.5 g/dL (ref 1.5–4.5)
Glucose: 101 mg/dL — ABNORMAL HIGH (ref 70–99)
Potassium: 4.3 mmol/L (ref 3.5–5.2)
Sodium: 142 mmol/L (ref 134–144)
Total Protein: 6.9 g/dL (ref 6.0–8.5)
eGFR: 81 mL/min/{1.73_m2} (ref >59)

## 2022-11-18 LAB — LIPID PANEL
Chol/HDL Ratio: 4 ratio (ref 0.0–4.4)
Cholesterol, Total: 220 mg/dL — ABNORMAL HIGH (ref 100–199)
HDL: 55 mg/dL (ref >39)
LDL Chol Calc (NIH): 147 mg/dL — ABNORMAL HIGH (ref 0–99)
Triglycerides: 102 mg/dL (ref 0–149)
VLDL Cholesterol Cal: 18 mg/dL (ref 5–40)

## 2022-11-18 LAB — HEMOGLOBIN A1C
Est. average glucose Bld gHb Est-mCnc: 137 mg/dL
Hgb A1c MFr Bld: 6.4 % — ABNORMAL HIGH (ref 4.8–5.6)

## 2022-11-18 LAB — VITAMIN D 25 HYDROXY (VIT D DEFICIENCY, FRACTURES): Vit D, 25-Hydroxy: 29.9 ng/mL — ABNORMAL LOW (ref 30.0–100.0)

## 2022-11-18 LAB — B12 AND FOLATE PANEL
Folate: 5.7 ng/mL (ref >3.0)
Vitamin B-12: 495 pg/mL (ref 232–1245)

## 2022-11-18 LAB — HIV ANTIBODY (ROUTINE TESTING W REFLEX): HIV Screen 4th Generation wRfx: NONREACTIVE

## 2022-11-18 LAB — HCV INTERPRETATION

## 2022-11-20 ENCOUNTER — Other Ambulatory Visit: Payer: Self-pay | Admitting: Internal Medicine

## 2022-11-20 DIAGNOSIS — E785 Hyperlipidemia, unspecified: Secondary | ICD-10-CM

## 2022-11-20 MED ORDER — ATORVASTATIN CALCIUM 80 MG PO TABS
80.0000 mg | ORAL_TABLET | Freq: Every day | ORAL | 3 refills | Status: DC
Start: 2022-11-20 — End: 2023-02-26

## 2022-11-24 ENCOUNTER — Telehealth: Payer: Self-pay | Admitting: Internal Medicine

## 2022-11-24 NOTE — Telephone Encounter (Signed)
Patient called need most recent lab results printed to pick up. Contact patient when ready for pick up

## 2022-11-24 NOTE — Telephone Encounter (Signed)
Labs results printed and patient aware.

## 2022-11-27 NOTE — Telephone Encounter (Signed)
Pt. Reports that she is waiting on her Labs report. Will follow up in 2 weeks

## 2022-11-29 ENCOUNTER — Other Ambulatory Visit (HOSPITAL_COMMUNITY): Payer: Self-pay

## 2022-12-04 DIAGNOSIS — Z419 Encounter for procedure for purposes other than remedying health state, unspecified: Secondary | ICD-10-CM | POA: Diagnosis not present

## 2022-12-11 ENCOUNTER — Other Ambulatory Visit: Payer: Self-pay | Admitting: Endocrinology

## 2022-12-11 ENCOUNTER — Other Ambulatory Visit (HOSPITAL_COMMUNITY): Payer: Self-pay

## 2022-12-11 MED ORDER — LEVOTHYROXINE SODIUM 88 MCG PO TABS: 88.0000 ug | ORAL_TABLET | Freq: Every day | ORAL | 1 refills | Status: DC

## 2023-01-04 ENCOUNTER — Ambulatory Visit
Admission: RE | Admit: 2023-01-04 | Discharge: 2023-01-04 | Disposition: A | Discharge status: Home / Self Care | Payer: 59 | Source: Ambulatory Visit | Attending: Family Medicine | Admitting: Family Medicine

## 2023-01-04 DIAGNOSIS — Z1231 Encounter for screening mammogram for malignant neoplasm of breast: Secondary | ICD-10-CM

## 2023-01-04 DIAGNOSIS — Z419 Encounter for procedure for purposes other than remedying health state, unspecified: Secondary | ICD-10-CM | POA: Diagnosis not present

## 2023-01-22 DIAGNOSIS — H6123 Impacted cerumen, bilateral: Secondary | ICD-10-CM | POA: Diagnosis not present

## 2023-01-22 DIAGNOSIS — Z6835 Body mass index (BMI) 35.0-35.9, adult: Secondary | ICD-10-CM | POA: Diagnosis not present

## 2023-01-22 DIAGNOSIS — I10 Essential (primary) hypertension: Secondary | ICD-10-CM | POA: Diagnosis not present

## 2023-01-23 ENCOUNTER — Ambulatory Visit: Payer: 59 | Admitting: Family Medicine

## 2023-01-29 ENCOUNTER — Ambulatory Visit: Payer: 59 | Admitting: Family Medicine

## 2023-02-04 DIAGNOSIS — Z419 Encounter for procedure for purposes other than remedying health state, unspecified: Secondary | ICD-10-CM | POA: Diagnosis not present

## 2023-02-08 DIAGNOSIS — H9311 Tinnitus, right ear: Secondary | ICD-10-CM | POA: Diagnosis not present

## 2023-02-08 DIAGNOSIS — H9201 Otalgia, right ear: Secondary | ICD-10-CM | POA: Diagnosis not present

## 2023-02-08 DIAGNOSIS — R42 Dizziness and giddiness: Secondary | ICD-10-CM | POA: Diagnosis not present

## 2023-02-12 ENCOUNTER — Ambulatory Visit: Payer: 59 | Admitting: Internal Medicine

## 2023-02-26 ENCOUNTER — Encounter: Payer: Self-pay | Admitting: Internal Medicine

## 2023-02-26 ENCOUNTER — Ambulatory Visit: Payer: Medicaid Other | Admitting: Internal Medicine

## 2023-02-26 VITALS — BP 134/72 | HR 80 | Ht 63.0 in | Wt 202.6 lb

## 2023-02-26 DIAGNOSIS — E119 Type 2 diabetes mellitus without complications: Secondary | ICD-10-CM

## 2023-02-26 DIAGNOSIS — E785 Hyperlipidemia, unspecified: Secondary | ICD-10-CM | POA: Diagnosis not present

## 2023-02-26 DIAGNOSIS — E559 Vitamin D deficiency, unspecified: Secondary | ICD-10-CM

## 2023-02-26 DIAGNOSIS — I10 Essential (primary) hypertension: Secondary | ICD-10-CM | POA: Diagnosis not present

## 2023-02-26 DIAGNOSIS — K219 Gastro-esophageal reflux disease without esophagitis: Secondary | ICD-10-CM | POA: Diagnosis not present

## 2023-02-26 DIAGNOSIS — E1169 Type 2 diabetes mellitus with other specified complication: Secondary | ICD-10-CM | POA: Diagnosis not present

## 2023-02-26 MED ORDER — ATORVASTATIN CALCIUM 80 MG PO TABS: 80.0000 mg | ORAL_TABLET | Freq: Every day | ORAL | 3 refills | Status: DC

## 2023-02-26 MED ORDER — PANTOPRAZOLE SODIUM 20 MG PO TBEC
20.0000 mg | DELAYED_RELEASE_TABLET | Freq: Every day | ORAL | 2 refills | Status: DC | PRN
Start: 2023-02-26 — End: 2023-03-13

## 2023-02-26 NOTE — Assessment & Plan Note (Signed)
Repeat lipid panel ordered in June.  Total cholesterol 220 and LDL 147.  Atorvastatin was increased to 80 mg daily at that time.  She has not experienced any adverse side effects since increasing atorvastatin. -Repeat lipid panel at follow-up in 3 months

## 2023-02-26 NOTE — Assessment & Plan Note (Signed)
Noted on previous labs.  She has a history of vitamin D deficiency.  Daily vitamin D supplementation recommended.

## 2023-02-26 NOTE — Progress Notes (Signed)
Established Patient Office Visit  Subjective   Patient ID: Jodi Wade, female    DOB: [birth year]  Age: 62 y.o. MRN: [id number]  Chief Complaint  Patient presents with   Follow-up    Follow up    [name] returns to care today for routine follow-up.  She was last evaluated by me on 6/14.  No medication changes were made at that time and 33-month follow-up was arranged.  In the interim, she presented to her previous PCP office [facility] endorsing right ear pain and tinnitus.  She has been referred to ENT.  There have otherwise been no acute interval events.  [name] reports feeling well today.  Her acute concern is intermittent reflux symptoms and a metallic taste in her mouth.  She states that symptoms have previously been alleviated with antacid medication.  She is interested in receiving a prescription for this today.  Past Medical History:  Diagnosis Date   Allergy    seasonal   Anemia    Anxiety    Arthritis    knee right   Diabetes mellitus without complication (HCC)    pt.denies,don't   GERD (gastroesophageal reflux disease)    Heart murmur    Hyperlipidemia    controlled   Hypertension    Thyroid disease    Past Surgical History:  Procedure Laterality Date   BREAST BIOPSY Right    COLONOSCOPY     KNEE ARTHROSCOPY WITH MEDIAL MENISECTOMY Right 01/13/2020   Procedure: KNEE ARTHROSCOPY WITH MEDIAL AND LATERAL  MENISCECTOMY;  Surgeon: Vickki Hearing, MD;  Location: [facility];  Service: Orthopedics;  Laterality: Right;   PARTIAL HYSTERECTOMY     abdominal   Social History   Tobacco Use   Smoking status: Never    Passive exposure: Current (husband smokes)   Smokeless tobacco: Never  Vaping Use   Vaping status: Never Used  Substance Use Topics   Alcohol use: Yes    Comment: Social   Drug use: No   Family History  Problem Relation Age of Onset   Cancer Mother    Breast cancer Mother    Thyroid disease Mother    Allergic  rhinitis Father    Thyroid disease Maternal Aunt    Thyroid disease Son        Hyperthyroid, not treated   Diabetes Other    Hypertension Other    Colon cancer Neg Hx    Colon polyps Neg Hx    Crohn's disease Neg Hx    Esophageal cancer Neg Hx    Rectal cancer Neg Hx    Stomach cancer Neg Hx    Ulcerative colitis Neg Hx    Allergies  Allergen Reactions   Fexofenadine-Pseudoephed Er Other (See Comments)    fast heart rate   Fexofenadine-Pseudoephed Er Other (See Comments)    Other Reaction(s): HEART RACE   Hydrochlorothiazide Other (See Comments)    Hypokalemia   Latex Hives and Rash   Pseudoephedrine Other (See Comments)    Heart Rate increases   Review of Systems  Gastrointestinal:  Positive for heartburn.  All other systems reviewed and are negative.    Objective:     BP 134/72   Pulse 80   Ht 5\' 3"  (1.6 m)   Wt 202 lb 9.6 oz (91.9 kg)   SpO2 96%   BMI 35.89 kg/m  BP Readings from Last 3 Encounters:  02/26/23 134/72  11/17/22 138/86  11/16/22 121/78   Physical Exam Vitals  reviewed.  Constitutional:      General: She is not in acute distress.    Appearance: Normal appearance. She is not toxic-appearing.  HENT:     Head: Normocephalic and atraumatic.     Right Ear: External ear normal.     Left Ear: External ear normal.     Nose: Nose normal. No congestion or rhinorrhea.     Mouth/Throat:     Mouth: Mucous membranes are moist.     Pharynx: Oropharynx is clear. No oropharyngeal exudate or posterior oropharyngeal erythema.  Eyes:     General: No scleral icterus.    Extraocular Movements: Extraocular movements intact.     Conjunctiva/sclera: Conjunctivae normal.     Pupils: Pupils are equal, round, and reactive to light.  Cardiovascular:     Rate and Rhythm: Normal rate and regular rhythm.     Pulses: Normal pulses.     Heart sounds: Normal heart sounds. No murmur heard.    No friction rub. No gallop.  Pulmonary:     Effort: Pulmonary effort is  normal.     Breath sounds: Normal breath sounds. No wheezing, rhonchi or rales.  Abdominal:     General: Abdomen is flat. Bowel sounds are normal. There is no distension.     Palpations: Abdomen is soft.     Tenderness: There is no abdominal tenderness.  Musculoskeletal:        General: No swelling. Normal range of motion.     Cervical back: Normal range of motion.     Right lower leg: No edema.     Left lower leg: No edema.  Lymphadenopathy:     Cervical: No cervical adenopathy.  Skin:    General: Skin is warm and dry.     Capillary Refill: Capillary refill takes less than 2 seconds.     Coloration: Skin is not jaundiced.  Neurological:     General: No focal deficit present.     Mental Status: She is alert and oriented to person, place, and time.  Psychiatric:        Mood and Affect: Mood normal.        Behavior: Behavior normal.   Last CBC Lab Results  Component Value Date   WBC 5.4 11/17/2022   HGB 15.1 11/17/2022   HCT 48.0 (H) 11/17/2022   MCV 84 11/17/2022   MCH 26.5 (L) 11/17/2022   RDW 13.5 11/17/2022   PLT 407 11/17/2022   Last metabolic panel Lab Results  Component Value Date   GLUCOSE 101 (H) 11/17/2022   NA 142 11/17/2022   K 4.3 11/17/2022   CL 103 11/17/2022   CO2 24 11/17/2022   BUN 10 11/17/2022   CREATININE 0.82 11/17/2022   EGFR 81 11/17/2022   CALCIUM 9.9 11/17/2022   PROT 6.9 11/17/2022   ALBUMIN 4.4 11/17/2022   LABGLOB 2.5 11/17/2022   AGRATIO 1.8 11/17/2022   BILITOT 0.4 11/17/2022   ALKPHOS 97 11/17/2022   AST 17 11/17/2022   ALT 16 11/17/2022   ANIONGAP 9 01/09/2020   Last lipids Lab Results  Component Value Date   CHOL 220 (H) 11/17/2022   HDL 55 11/17/2022   LDLCALC 147 (H) 11/17/2022   TRIG 102 11/17/2022   CHOLHDL 4.0 11/17/2022   Last hemoglobin A1c Lab Results  Component Value Date   HGBA1C 6.4 (H) 11/17/2022   Last thyroid functions Lab Results  Component Value Date   TSH 3.19 11/13/2022   Last vitamin D Lab  Results  Component Value Date   VD25OH 29.9 (L) 11/17/2022   Last vitamin B12 and Folate Lab Results  Component Value Date   VITAMINB12 495 11/17/2022   FOLATE 5.7 11/17/2022   The 10-year ASCVD risk score (Arnett DK, et al., 2019) is: 21.2%    Assessment & Plan:   Problem List Items Addressed This Visit       Essential hypertension    Remains adequately controlled on current antihypertensive regimen.  She checks her blood pressure regularly at home and readings are well within goal.  No medication changes are indicated today.      GERD (gastroesophageal reflux disease) - Primary    She endorses GERD symptoms and a metallic taste in her mouth.  Symptoms have previously been resolved with OTC antacid medication.  Prescription requested today. -Protonix 20 mg daily as needed for symptom relief has been prescribed today      Type 2 diabetes mellitus without complication, without long-term current use of insulin (HCC)    A1c 6.4 in June.  Diet controlled. -No medication changes indicated today       Hyperlipidemia associated with type 2 diabetes mellitus (HCC)    Repeat lipid panel ordered in June.  Total cholesterol 220 and LDL 147.  Atorvastatin was increased to 80 mg daily at that time.  She has not experienced any adverse side effects since increasing atorvastatin. -Repeat lipid panel at follow-up in 3 months      Vitamin D insufficiency    Noted on previous labs.  She has a history of vitamin D deficiency.  Daily vitamin D supplementation recommended.       Return in about 3 months (around 05/28/2023).    Billie Lade, MD

## 2023-02-26 NOTE — Assessment & Plan Note (Signed)
A1c 6.4 in June.  Diet controlled. -No medication changes indicated today

## 2023-02-26 NOTE — Patient Instructions (Signed)
It was a pleasure to see you today.  Thank you for giving [place] the opportunity to be involved in your care.  Below is a brief recap of your visit and next steps.  We will plan to see you again in 3 months.  Summary Add protonix 20 mg as needed for reflux relief I recommend starting vitamin D3 supplementation  -  2000 units Follow up in 3 months

## 2023-02-26 NOTE — Assessment & Plan Note (Signed)
Remains adequately controlled on current antihypertensive regimen.  She checks her blood pressure regularly at home and readings are well within goal.  No medication changes are indicated today.

## 2023-02-26 NOTE — Assessment & Plan Note (Signed)
She endorses GERD symptoms and a metallic taste in her mouth.  Symptoms have previously been resolved with OTC antacid medication.  Prescription requested today. -Protonix 20 mg daily as needed for symptom relief has been prescribed today

## 2023-03-06 DIAGNOSIS — Z419 Encounter for procedure for purposes other than remedying health state, unspecified: Secondary | ICD-10-CM | POA: Diagnosis not present

## 2023-03-13 ENCOUNTER — Telehealth: Payer: Self-pay | Admitting: Internal Medicine

## 2023-03-13 DIAGNOSIS — K219 Gastro-esophageal reflux disease without esophagitis: Secondary | ICD-10-CM

## 2023-03-13 MED ORDER — PANTOPRAZOLE SODIUM 40 MG PO TBEC: 40.0000 mg | DELAYED_RELEASE_TABLET | Freq: Every day | ORAL | 2 refills | Status: DC | PRN

## 2023-03-13 NOTE — Telephone Encounter (Signed)
Please see pt request to increase her protonix.

## 2023-03-13 NOTE — Telephone Encounter (Signed)
Patient called asking can Dr Durwin Nora send in 40 mg instead of the 20 mg on pantoprazole (PROTONIX). The 20 mg is working but likes the 40 mg to work faster.  Pharmacy  [id number] - [place], [place] - [address] AT [facility] [address], [place] [address] Phone: [phone]  Fax: [phone] DEA #: [id number]

## 2023-03-14 ENCOUNTER — Telehealth: Payer: Self-pay | Admitting: Internal Medicine

## 2023-03-14 NOTE — Telephone Encounter (Signed)
Faxed for records

## 2023-03-14 NOTE — Telephone Encounter (Signed)
Pt had DRS done with Dr Yetta Barre with [facility] [address] in [place]

## 2023-03-14 NOTE — Telephone Encounter (Signed)
Patient advised.

## 2023-03-16 ENCOUNTER — Telehealth: Payer: Self-pay | Admitting: Internal Medicine

## 2023-03-16 NOTE — Telephone Encounter (Signed)
Patient is calling says she is having a drip coming down from her nose says it has a metal taste, says it mixes with with her acid reflux and tastes horrible. Please advise Thank you

## 2023-03-19 ENCOUNTER — Ambulatory Visit: Payer: Medicaid Other | Admitting: Internal Medicine

## 2023-03-21 ENCOUNTER — Telehealth: Payer: Self-pay

## 2023-03-21 NOTE — Telephone Encounter (Signed)
Forwarding to provider as a Financial planner

## 2023-03-22 DIAGNOSIS — H903 Sensorineural hearing loss, bilateral: Secondary | ICD-10-CM | POA: Diagnosis not present

## 2023-03-22 DIAGNOSIS — H9311 Tinnitus, right ear: Secondary | ICD-10-CM | POA: Diagnosis not present

## 2023-03-28 ENCOUNTER — Other Ambulatory Visit (HOSPITAL_COMMUNITY): Payer: Self-pay | Admitting: Physician Assistant

## 2023-03-28 DIAGNOSIS — H903 Sensorineural hearing loss, bilateral: Secondary | ICD-10-CM

## 2023-03-31 ENCOUNTER — Ambulatory Visit (HOSPITAL_COMMUNITY)
Admission: RE | Admit: 2023-03-31 | Discharge: 2023-03-31 | Disposition: A | Discharge status: Home / Self Care | Payer: Medicaid Other | Source: Ambulatory Visit | Attending: Physician Assistant | Admitting: Physician Assistant

## 2023-03-31 DIAGNOSIS — H903 Sensorineural hearing loss, bilateral: Secondary | ICD-10-CM

## 2023-03-31 DIAGNOSIS — H9311 Tinnitus, right ear: Secondary | ICD-10-CM | POA: Diagnosis not present

## 2023-03-31 DIAGNOSIS — I6782 Cerebral ischemia: Secondary | ICD-10-CM | POA: Diagnosis not present

## 2023-03-31 MED ORDER — GADOBUTROL 1 MMOL/ML IV SOLN
9.0000 mL | Freq: Once | INTRAVENOUS | Status: AC | PRN
  Administered 2023-03-31: 9 mL via INTRAVENOUS

## 2023-04-06 DIAGNOSIS — Z419 Encounter for procedure for purposes other than remedying health state, unspecified: Secondary | ICD-10-CM | POA: Diagnosis not present

## 2023-04-18 ENCOUNTER — Ambulatory Visit: Payer: No Typology Code available for payment source | Admitting: Orthopedic Surgery

## 2023-05-06 DIAGNOSIS — Z419 Encounter for procedure for purposes other than remedying health state, unspecified: Secondary | ICD-10-CM | POA: Diagnosis not present

## 2023-05-28 ENCOUNTER — Ambulatory Visit: Payer: Medicaid Other | Admitting: Internal Medicine

## 2023-06-04 ENCOUNTER — Telehealth: Payer: Self-pay | Admitting: Internal Medicine

## 2023-06-04 ENCOUNTER — Other Ambulatory Visit: Payer: Self-pay

## 2023-06-04 DIAGNOSIS — I10 Essential (primary) hypertension: Secondary | ICD-10-CM

## 2023-06-04 MED ORDER — LISINOPRIL 40 MG PO TABS: 40.0000 mg | ORAL_TABLET | Freq: Every day | ORAL | 1 refills | Status: DC

## 2023-06-04 NOTE — Telephone Encounter (Signed)
Prescription Request  06/04/2023  LOV: 02/26/2023  What is the name of the medication or equipment? lisinopril (ZESTRIL) 40 MG tablet [[id number]]   Have you contacted your pharmacy to request a refill? Yes   Which pharmacy would you like this sent to?  [facility]   Patient notified that their request is being sent to the clinical staff for review and that they should receive a response within 2 business days.   Please advise at Mobile [phone] (mobile)

## 2023-06-04 NOTE — Telephone Encounter (Signed)
Refills sent to pharmacy. 

## 2023-06-06 DIAGNOSIS — Z419 Encounter for procedure for purposes other than remedying health state, unspecified: Secondary | ICD-10-CM | POA: Diagnosis not present

## 2023-06-07 ENCOUNTER — Telehealth: Payer: Self-pay | Admitting: Internal Medicine

## 2023-06-07 ENCOUNTER — Ambulatory Visit: Payer: Medicaid Other | Admitting: Internal Medicine

## 2023-06-07 ENCOUNTER — Encounter: Payer: Self-pay | Admitting: Family Medicine

## 2023-06-07 ENCOUNTER — Ambulatory Visit: Payer: Medicaid Other | Admitting: Family Medicine

## 2023-06-07 ENCOUNTER — Ambulatory Visit (INDEPENDENT_AMBULATORY_CARE_PROVIDER_SITE_OTHER): Payer: Medicaid Other | Admitting: Family Medicine

## 2023-06-07 VITALS — BP 140/90 | HR 78 | Ht 63.0 in | Wt 203.0 lb

## 2023-06-07 DIAGNOSIS — N951 Menopausal and female climacteric states: Secondary | ICD-10-CM | POA: Diagnosis not present

## 2023-06-07 DIAGNOSIS — E119 Type 2 diabetes mellitus without complications: Secondary | ICD-10-CM

## 2023-06-07 DIAGNOSIS — I10 Essential (primary) hypertension: Secondary | ICD-10-CM | POA: Diagnosis not present

## 2023-06-07 DIAGNOSIS — E1169 Type 2 diabetes mellitus with other specified complication: Secondary | ICD-10-CM

## 2023-06-07 MED ORDER — LEVOTHYROXINE SODIUM 88 MCG PO TABS: 88.0000 ug | ORAL_TABLET | Freq: Every day | ORAL | 1 refills | Status: DC

## 2023-06-07 NOTE — Patient Instructions (Addendum)
 F/u with PCP, Dr Melvenia in 3 to 4 weeks, call if you need to be seen before  Condolence re your recent loss    OTC [place] Jelly , Replens or coconut oil are good lubricants , which can be used. You may benefit from topical estrogen cream which is a prescription, but will hold on that for now  Blood pressure is above goal at this visit , just try to limit salty and canned foods , increase vegetables  and  fruits and   You do need fasting labs before your next appointment with Dr Melvenia , I will reach out and ask him to order the labs he wants   6 months of current dose of thyroid  med is prescribed  Thanks for choosing [facility], we consider it a privelige to serve you.

## 2023-06-07 NOTE — Telephone Encounter (Signed)
 Patient asking to switch to [facility], just feels more comfortable with female.

## 2023-06-11 DIAGNOSIS — N951 Menopausal and female climacteric states: Secondary | ICD-10-CM | POA: Insufficient documentation

## 2023-06-11 NOTE — Progress Notes (Signed)
   Jodi Wade     MRN: [id number]      DOB: 02/01/1961  Chief Complaint  Patient presents with   Follow-up    [place] pt nasal drainage spouse passed away 06/05/2024     HPI Jodi Wade is here to discuss vaginal dryness and seeming stenosis over the past approx 6 months when inercourse attempted . Unfortunately she lost her spouse 3 days ago to pancreatic cancer which was diagnosed approx 3 months prior to his passing Denies vaginal discomfort , or pressure most of the time,pproblem has  been related to sexual activity to present time ROS Denies recent fever or chills. Denies sinus pressure, nasal congestion, ear pain or sore throat. Denies chest congestion, productive cough or wheezing. Denies chest pains, palpitations and leg swelling Denies abdominal pain, nausea, vomiting,diarrhea or constipation.   Denies dysuria, frequency, hesitancy or incontinence. Denies joint pain, swelling and limitation in mobility. Denies headaches, seizures, numbness, or tingling. Acute grief with recent loss of spouse after acute terminal illness. Denies skin break down or rash.   PE  BP (!) 140/90   Pulse 78   Ht 5' 3 (1.6 m)   Wt 203 lb 0.6 oz (92.1 kg)   SpO2 98%   BMI 35.97 kg/m   Patient alert and oriented and in no cardiopulmonary distress.  HEENT: No facial asymmetry, EOMI,     Neck supple .  Chest: Clear to auscultation bilaterally.  CVS: S1, S2 no murmurs, no S3.Regular rate.    Ext: No edema  MS: Adequate ROM spine, shoulders, hips and knees.  Skin: Intact, no ulcerations or rash noted.  Psych: Good eye contact,sad and slightly tearful, actually trying to process spouse's recent illness and death  CNS: CN 2-12 intact, power,  normal throughout.no focal deficits noted.   Assessment & Plan  Vaginal dryness, menopausal Topical oTC products recommended, Replens ,[place] jelly , coconut oil. Increased sexual activity very beneficial, however currently a recent widow  unfortunately Asymptomatic unless having intercourse, hold off on topical estrogen   Essential hypertension Elevated at visit , currently has a lot of anxiety and stress with passing of ill spouse 3 days ago PCP f/u to re eval and manage  Type 2 diabetes mellitus without complication, without long-term current use of insulin (HCC) Updated lab needed at/ before next visit. PCP to order

## 2023-06-11 NOTE — Assessment & Plan Note (Signed)
 Topical oTC products recommended, Replens ,[place] jelly , coconut oil. Increased sexual activity very beneficial, however currently a recent widow unfortunately Asymptomatic unless having intercourse, hold off on topical estrogen

## 2023-06-11 NOTE — Assessment & Plan Note (Signed)
 Updated lab needed at/ before next visit. PCP to order

## 2023-06-11 NOTE — Assessment & Plan Note (Signed)
 Elevated at visit , currently has a lot of anxiety and stress with passing of ill spouse 3 days ago PCP f/u to re eval and manage

## 2023-06-12 NOTE — Telephone Encounter (Signed)
 That's fine

## 2023-06-13 DIAGNOSIS — H9311 Tinnitus, right ear: Secondary | ICD-10-CM | POA: Diagnosis not present

## 2023-06-13 DIAGNOSIS — H903 Sensorineural hearing loss, bilateral: Secondary | ICD-10-CM | POA: Diagnosis not present

## 2023-06-13 NOTE — Telephone Encounter (Signed)
 Called patient, she would like to Dr Durwin Nora 01.31.2025 for her follow up for A1C due to [facility] has no openings til March 2025.

## 2023-06-29 ENCOUNTER — Other Ambulatory Visit: Payer: Self-pay | Admitting: Internal Medicine

## 2023-06-29 DIAGNOSIS — K219 Gastro-esophageal reflux disease without esophagitis: Secondary | ICD-10-CM

## 2023-07-06 ENCOUNTER — Ambulatory Visit: Payer: Medicaid Other | Admitting: Internal Medicine

## 2023-07-06 ENCOUNTER — Ambulatory Visit: Payer: Medicaid Other | Admitting: Family Medicine

## 2023-07-07 DIAGNOSIS — Z419 Encounter for procedure for purposes other than remedying health state, unspecified: Secondary | ICD-10-CM | POA: Diagnosis not present

## 2023-07-20 ENCOUNTER — Ambulatory Visit: Payer: Medicaid Other | Admitting: Nurse Practitioner

## 2023-08-02 ENCOUNTER — Ambulatory Visit: Payer: Medicaid Other | Admitting: Family Medicine

## 2023-08-17 ENCOUNTER — Ambulatory Visit: Payer: Medicaid Other | Admitting: Nurse Practitioner

## 2023-08-17 VITALS — BP 138/84 | HR 82 | Temp 97.7°F | Ht 63.0 in | Wt 202.0 lb

## 2023-08-17 DIAGNOSIS — R300 Dysuria: Secondary | ICD-10-CM | POA: Insufficient documentation

## 2023-08-17 DIAGNOSIS — R011 Cardiac murmur, unspecified: Secondary | ICD-10-CM

## 2023-08-17 DIAGNOSIS — R739 Hyperglycemia, unspecified: Secondary | ICD-10-CM | POA: Diagnosis not present

## 2023-08-17 DIAGNOSIS — E785 Hyperlipidemia, unspecified: Secondary | ICD-10-CM | POA: Diagnosis not present

## 2023-08-17 DIAGNOSIS — N951 Menopausal and female climacteric states: Secondary | ICD-10-CM | POA: Diagnosis not present

## 2023-08-17 DIAGNOSIS — Z6835 Body mass index (BMI) 35.0-35.9, adult: Secondary | ICD-10-CM

## 2023-08-17 DIAGNOSIS — I10 Essential (primary) hypertension: Secondary | ICD-10-CM | POA: Diagnosis not present

## 2023-08-17 DIAGNOSIS — E559 Vitamin D deficiency, unspecified: Secondary | ICD-10-CM

## 2023-08-17 DIAGNOSIS — E66812 Obesity, class 2: Secondary | ICD-10-CM

## 2023-08-17 DIAGNOSIS — Z124 Encounter for screening for malignant neoplasm of cervix: Secondary | ICD-10-CM | POA: Diagnosis not present

## 2023-08-17 LAB — LIPID PANEL
Cholesterol: 179 mg/dL (ref 0–200)
HDL: 48.5 mg/dL (ref >39.00)
LDL Cholesterol: 115 mg/dL — ABNORMAL HIGH (ref 0–99)
NonHDL: 130.5
Total CHOL/HDL Ratio: 4
Triglycerides: 77 mg/dL (ref 0.0–149.0)
VLDL: 15.4 mg/dL (ref 0.0–40.0)

## 2023-08-17 LAB — COMPREHENSIVE METABOLIC PANEL
ALT: 13 U/L (ref 0–35)
AST: 14 U/L (ref 0–37)
Albumin: 4.6 g/dL (ref 3.5–5.2)
Alkaline Phosphatase: 80 U/L (ref 39–117)
BUN: 10 mg/dL (ref 6–23)
CO2: 26 meq/L (ref 19–32)
Calcium: 9.9 mg/dL (ref 8.4–10.5)
Chloride: 105 meq/L (ref 96–112)
Creatinine, Ser: 0.66 mg/dL (ref 0.40–1.20)
GFR: 93.68 mL/min (ref >60.00)
Glucose, Bld: 105 mg/dL — ABNORMAL HIGH (ref 70–99)
Potassium: 3.6 meq/L (ref 3.5–5.1)
Sodium: 141 meq/L (ref 135–145)
Total Bilirubin: 0.7 mg/dL (ref 0.2–1.2)
Total Protein: 8 g/dL (ref 6.0–8.3)

## 2023-08-17 LAB — URINALYSIS, ROUTINE W REFLEX MICROSCOPIC
Bilirubin Urine: NEGATIVE
Hgb urine dipstick: NEGATIVE
Ketones, ur: NEGATIVE
Leukocytes,Ua: NEGATIVE
Nitrite: NEGATIVE
Specific Gravity, Urine: 1.015 (ref 1.000–1.030)
Total Protein, Urine: NEGATIVE
Urine Glucose: NEGATIVE
Urobilinogen, UA: 0.2 (ref 0.0–1.0)
pH: 6 (ref 5.0–8.0)

## 2023-08-17 LAB — HEMOGLOBIN A1C: Hgb A1c MFr Bld: 6.6 % — ABNORMAL HIGH (ref 4.6–6.5)

## 2023-08-17 LAB — MICROALBUMIN / CREATININE URINE RATIO
Creatinine,U: 88.7 mg/dL
Microalb Creat Ratio: 14.7 mg/g (ref 0.0–30.0)
Microalb, Ur: 1.3 mg/dL (ref 0.0–1.9)

## 2023-08-17 LAB — CBC
HCT: 43.8 % (ref 36.0–46.0)
Hemoglobin: 14.5 g/dL (ref 12.0–15.0)
MCHC: 33.1 g/dL (ref 30.0–36.0)
MCV: 83.3 fl (ref 78.0–100.0)
Platelets: 360 10*3/uL (ref 150.0–400.0)
RBC: 5.26 Mil/uL — ABNORMAL HIGH (ref 3.87–5.11)
RDW: 14.1 % (ref 11.5–15.5)
WBC: 6.7 10*3/uL (ref 4.0–10.5)

## 2023-08-17 LAB — VITAMIN D 25 HYDROXY (VIT D DEFICIENCY, FRACTURES): VITD: 31.62 ng/mL (ref 30.00–100.00)

## 2023-08-17 LAB — TSH: TSH: 1.05 u[IU]/mL (ref 0.35–5.50)

## 2023-08-17 NOTE — Assessment & Plan Note (Signed)
 Chronic Will check urine for evidence of hematuria or UTI.  Further recommendations may be made based upon these results.  May consider topical estrogen therapy when we see each other at follow-up in 1 month.

## 2023-08-17 NOTE — Assessment & Plan Note (Signed)
 Chronic, stable Continue amlodipine 10 mg daily and lisinopril 40 mg daily

## 2023-08-17 NOTE — Progress Notes (Signed)
 New Patient Office Visit  Subjective    Patient ID: Jodi Wade, female    DOB: 15-Dec-1960  Age: 63 y.o. MRN: [id number]  CC:  Chief Complaint  Patient presents with   vaginal dryness    HPI Jodi Wade presents to establish care She is transitioning to this office from previous PCP because she prefers seeing a provider in [place]. She reports that she has postmenopausal vaginal dryness as well as intermittent pelvic pain.  Pain occurs once every 6 months to once a year usually accompanied with heavy lifting.  Denies any vaginal bleeding, reports history of hysterectomy at age 14 for treatment of fibroids.  Is not sure if she still needs Pap smears.  She reports that weight is stable, she did lose about 10 pounds unintentionally when she left work to help her daughter raise her grandchildren.  She attributes this mostly to being more physically active and this has been over the last 2 and half years.  She reports that she does not have history of diabetes, but per chart review diabetes is listed.  She is not currently on any medications to manage blood sugar.   Outpatient Encounter Medications as of 08/17/2023  Medication Sig   amLODipine (NORVASC) 10 MG tablet Take 1 tablet (10 mg total) by mouth daily.   atorvastatin (LIPITOR) 80 MG tablet Take 1 tablet (80 mg total) by mouth daily.   levothyroxine (SYNTHROID) 88 MCG tablet Take 1 tablet (88 mcg total) by mouth daily.   lisinopril (ZESTRIL) 40 MG tablet Take 1 tablet (40 mg total) by mouth daily.   Multiple Vitamin (MULTIVITAMIN ADULT PO) Take by mouth daily.   pantoprazole (PROTONIX) 40 MG tablet Take 1 tablet (40 mg total) by mouth daily as needed.   EPINEPHrine (EPIPEN 2-PAK) 0.3 mg/0.3 mL IJ SOAJ injection Inject 0.3 mg into the muscle as needed for anaphylaxis. (Patient not taking: Reported on 08/17/2023)   No facility-administered encounter medications on file as of 08/17/2023.    Past Medical History:   Diagnosis Date   Allergy    seasonal   Anemia    Anxiety    Arthritis    knee right   Diabetes mellitus without complication (HCC)    pt.denies,don't   GERD (gastroesophageal reflux disease)    Heart murmur    Hyperlipidemia    controlled   Hypertension    Thyroid disease     Past Surgical History:  Procedure Laterality Date   BREAST BIOPSY Right    COLONOSCOPY     KNEE ARTHROSCOPY WITH MEDIAL MENISECTOMY Right 01/13/2020   Procedure: KNEE ARTHROSCOPY WITH MEDIAL AND LATERAL  MENISCECTOMY;  Surgeon: Vickki Hearing, MD;  Location: [facility];  Service: Orthopedics;  Laterality: Right;   PARTIAL HYSTERECTOMY     abdominal    Family History  Problem Relation Age of Onset   Cancer Mother    Breast cancer Mother    Thyroid disease Mother    Allergic rhinitis Father    Thyroid disease Maternal Aunt    Thyroid disease Son        Hyperthyroid, not treated   Diabetes Other    Hypertension Other    Colon cancer Neg Hx    Colon polyps Neg Hx    Crohn's disease Neg Hx    Esophageal cancer Neg Hx    Rectal cancer Neg Hx    Stomach cancer Neg Hx    Ulcerative colitis Neg Hx     Social History  Socioeconomic History   Marital status: Married    Spouse name: Not on file   Number of children: Not on file   Years of education: Not on file   Highest education level: Not on file  Occupational History   Not on file  Tobacco Use   Smoking status: Never    Passive exposure: Current (husband smokes)   Smokeless tobacco: Never  Vaping Use   Vaping status: Never Used  Substance and Sexual Activity   Alcohol use: Yes    Comment: Social   Drug use: No   Sexual activity: Yes  Other Topics Concern   Not on file  Social History Narrative   Not on file   Social Drivers of Health   Financial Resource Strain: Not on file  Food Insecurity: Not on file  Transportation Needs: Not on file  Physical Activity: Not on file  Stress: Not on file  Social Connections: Not on  file  Intimate Partner Violence: Not on file    ROS: see HPI      Objective    BP 138/84   Pulse 82   Temp 97.7 F (36.5 C) (Temporal)   Ht 5\' 3"  (1.6 m)   Wt 202 lb (91.6 kg)   SpO2 98%   BMI 35.78 kg/m   Physical Exam Vitals reviewed.  Constitutional:      General: She is not in acute distress.    Appearance: Normal appearance.  HENT:     Head: Normocephalic and atraumatic.  Neck:     Vascular: No carotid bruit.  Cardiovascular:     Rate and Rhythm: Normal rate and regular rhythm.     Pulses: Normal pulses.     Heart sounds: Murmur heard.  Pulmonary:     Effort: Pulmonary effort is normal.     Breath sounds: Normal breath sounds.  Skin:    General: Skin is warm and dry.  Neurological:     General: No focal deficit present.     Mental Status: She is alert and oriented to person, place, and time.  Psychiatric:        Mood and Affect: Mood normal.        Behavior: Behavior normal.        Judgment: Judgment normal.         Assessment & Plan:   Problem List Items Addressed This Visit       Cardiovascular and Mediastinum   Essential hypertension   Chronic, stable Continue amlodipine 10 mg daily and lisinopril 40 mg daily      Relevant Orders   CBC   Comprehensive metabolic panel     Genitourinary   Vaginal dryness, menopausal   Chronic Will check urine for evidence of hematuria or UTI.  Further recommendations may be made based upon these results.  May consider topical estrogen therapy when we see each other at follow-up in 1 month.        Other   Vitamin D insufficiency   Labs ordered, further recommendations may be made based upon these results       Relevant Orders   VITAMIN D 25 Hydroxy (Vit-D Deficiency, Fractures)   Murmur   Incidental finding on exam, patient reports this is chronic and she is asymptomatic currently.  No additional workup recommended at this time.  May consider cardiac echocardiogram in the future.      Cervical  cancer screening - Primary   Based on history of hysterectomy I think is unlikely that  she will require Pap smears.  However unable to view previous medical records so will refer to OB/GYN for consultation to determine whether or not she needs routine Pap smears for cervical cancer screening.      Relevant Orders   Ambulatory referral to Obstetrics / Gynecology   Hyperglycemia   Labs ordered, further recommendations may be made based upon his results       Relevant Orders   Hemoglobin A1c   Hyperlipidemia   Labs ordered, further recommendations may be made based upon these results       Relevant Orders   Lipid panel   Class 2 obesity without serious comorbidity with body mass index (BMI) of 35.0 to 35.9 in adult   Labs ordered, further recommendations may be made based upon these results       Relevant Orders   Hemoglobin A1c   Lipid panel   TSH   Dysuria   Labs ordered, further recommendations may be made based upon these results       Relevant Orders   Microalbumin / creatinine urine ratio   Urinalysis, Routine w reflex microscopic   Urine Culture    Return in about 1 month (around 09/17/2023) for F/U with Aswad Wandrey.   Elenore Paddy, NP

## 2023-08-17 NOTE — Assessment & Plan Note (Signed)
 Incidental finding on exam, patient reports this is chronic and she is asymptomatic currently.  No additional workup recommended at this time.  May consider cardiac echocardiogram in the future.

## 2023-08-17 NOTE — Assessment & Plan Note (Signed)
 Labs ordered, further recommendations may be made based upon these results

## 2023-08-17 NOTE — Assessment & Plan Note (Signed)
 Labs ordered, further recommendations may be made based upon his results.

## 2023-08-17 NOTE — Assessment & Plan Note (Signed)
 Based on history of hysterectomy I think is unlikely that she will require Pap smears.  However unable to view previous medical records so will refer to OB/GYN for consultation to determine whether or not she needs routine Pap smears for cervical cancer screening.

## 2023-08-18 ENCOUNTER — Other Ambulatory Visit: Payer: Self-pay | Admitting: Internal Medicine

## 2023-08-18 DIAGNOSIS — K219 Gastro-esophageal reflux disease without esophagitis: Secondary | ICD-10-CM

## 2023-08-18 LAB — URINE CULTURE: Result:: NO GROWTH

## 2023-08-20 ENCOUNTER — Telehealth: Payer: Self-pay

## 2023-08-20 NOTE — Telephone Encounter (Signed)
 Copied from CRM [id number]. Topic: General - Other >> Aug 20, 2023 12:54 PM Eunice Blase wrote: Reason for CRM: Pt called regarding lab results and would like to discuss with nurse. Please call pt at [phone].

## 2023-08-22 ENCOUNTER — Other Ambulatory Visit: Payer: Self-pay | Admitting: Nurse Practitioner

## 2023-08-22 DIAGNOSIS — A599 Trichomoniasis, unspecified: Secondary | ICD-10-CM

## 2023-08-22 MED ORDER — METRONIDAZOLE 500 MG PO TABS: 500.0000 mg | ORAL_TABLET | Freq: Two times a day (BID) | ORAL | 0 refills | Status: DC

## 2023-09-19 ENCOUNTER — Ambulatory Visit: Admitting: Nurse Practitioner

## 2023-09-19 ENCOUNTER — Telehealth: Payer: Self-pay

## 2023-09-19 VITALS — BP 136/88 | HR 78 | Temp 98.3°F | Ht 63.0 in | Wt 204.0 lb

## 2023-09-19 DIAGNOSIS — E1169 Type 2 diabetes mellitus with other specified complication: Secondary | ICD-10-CM | POA: Diagnosis not present

## 2023-09-19 DIAGNOSIS — E119 Type 2 diabetes mellitus without complications: Secondary | ICD-10-CM | POA: Insufficient documentation

## 2023-09-19 DIAGNOSIS — E785 Hyperlipidemia, unspecified: Secondary | ICD-10-CM

## 2023-09-19 DIAGNOSIS — N951 Menopausal and female climacteric states: Secondary | ICD-10-CM

## 2023-09-19 DIAGNOSIS — I10 Essential (primary) hypertension: Secondary | ICD-10-CM | POA: Diagnosis not present

## 2023-09-19 DIAGNOSIS — R011 Cardiac murmur, unspecified: Secondary | ICD-10-CM

## 2023-09-19 MED ORDER — TELMISARTAN 40 MG PO TABS: 40.0000 mg | ORAL_TABLET | Freq: Every day | ORAL | 1 refills | Status: DC

## 2023-09-19 MED ORDER — ESTRADIOL 0.1 MG/GM VA CREA: TOPICAL_CREAM | VAGINAL | 12 refills | Status: AC

## 2023-09-19 NOTE — Progress Notes (Signed)
 Established Patient Office Visit  Subjective   Patient ID: Jodi Wade, female    DOB: 08/07/1960  Age: 63 y.o. MRN: [id number]  Chief Complaint  Patient presents with   Heart Murmur    Heart murmur: Chronic, present since childhood.  Continues to be asymptomatic.  Patient does not remember ever having undergone cardiac echocardiogram.  Hypertension/type 2 diabetes/hyperlipidemia: Chronic continues on amlodipine 10 mg daily, and lisinopril 40 mg daily.  Blood pressure above goal today, it was also above goal last office visit.  Newly diagnosed type 2 diabetes with A1c of 6.6.  Not currently on medication for blood sugars, patient prefers to focus on lifestyle modification as opposed to starting new medication today.  Last LDL 115, she is on atorvastatin 80 mg daily.  Vaginal dryness: Continues to experience vaginal dryness and discomfort.  Would like to try hormone replacement therapy.  Has had hysterectomy.  No history of breast cancer.  Never smoker.  No history of blood clot or stroke.    ROS: see HPI    Objective:     BP 136/88   Pulse 78   Temp 98.3 F (36.8 C) (Temporal)   Ht 5\' 3"  (1.6 m)   Wt 204 lb (92.5 kg)   SpO2 98%   BMI 36.14 kg/m  BP Readings from Last 3 Encounters:  09/19/23 136/88  08/17/23 138/84  06/07/23 (!) 140/90   Wt Readings from Last 3 Encounters:  09/19/23 204 lb (92.5 kg)  08/17/23 202 lb (91.6 kg)  06/07/23 203 lb 0.6 oz (92.1 kg)      Physical Exam Vitals reviewed.  Constitutional:      General: She is not in acute distress.    Appearance: Normal appearance.  HENT:     Head: Normocephalic and atraumatic.  Neck:     Vascular: No carotid bruit.  Cardiovascular:     Rate and Rhythm: Normal rate and regular rhythm.     Pulses: Normal pulses.     Heart sounds: Normal heart sounds.  Pulmonary:     Effort: Pulmonary effort is normal.     Breath sounds: Normal breath sounds.  Skin:    General: Skin is warm and dry.   Neurological:     General: No focal deficit present.     Mental Status: She is alert and oriented to person, place, and time.  Psychiatric:        Mood and Affect: Mood normal.        Behavior: Behavior normal.        Judgment: Judgment normal.      No results found for any visits on 09/19/23.    The 10-year ASCVD risk score (Arnett DK, et al., 2019) is: 20.2%    Assessment & Plan:   Problem List Items Addressed This Visit       Cardiovascular and Mediastinum   Essential hypertension - Primary   Chronic, above goal Continue amlodipine 10 mg daily, will transition from lisinopril to telmisartan to see if she has better effect from this.  Start telmisartan 40 mg daily.  Patient told to return to lab for repeat metabolic panel 7 to 10 days after starting telmisartan.  We will also check urine for albuminuria at that time.  Follow-up in 1 month for blood pressure check.      Relevant Medications   telmisartan (MICARDIS) 40 MG tablet   Other Relevant Orders   Basic metabolic panel with GFR     Endocrine  Hyperlipidemia associated with type 2 diabetes mellitus (HCC)   Chronic Continue atorvastatin 80 mg daily, last LDL slightly above goal.  Consider adding additional agent at next office visit.      Relevant Medications   telmisartan (MICARDIS) 40 MG tablet   Type 2 diabetes mellitus without complication (HCC)   Chronic, A1c at goal Patient was to focus on lifestyle modification, she was encouraged to follow healthy diet. Continue atorvastatin and ARB. Check for albuminuria at next lab draw.      Relevant Medications   telmisartan (MICARDIS) 40 MG tablet   Other Relevant Orders   Microalbumin / creatinine urine ratio     Genitourinary   Vaginal dryness, menopausal   Chronic Start patient on estradiol vaginal cream.  Patient to follow-up in 1 month or sooner as needed.      Relevant Medications   estradiol (ESTRACE) 0.1 MG/GM vaginal cream     Other    Murmur   Chronic, asymptomatic Discussed cardiac echocardiogram today.  Patient declined but will consider this.  Patient was encouraged to let me know if she changes her mind which point I will move forward with ordering cardiac echo.       Return in about 1 month (around 10/19/2023) for F/U with Onix Jumper.    Zorita Hiss, NP

## 2023-09-19 NOTE — Assessment & Plan Note (Signed)
 Chronic, asymptomatic Discussed cardiac echocardiogram today.  Patient declined but will consider this.  Patient was encouraged to let me know if she changes her mind which point I will move forward with ordering cardiac echo.

## 2023-09-19 NOTE — Assessment & Plan Note (Signed)
 Chronic Start patient on estradiol vaginal cream.  Patient to follow-up in 1 month or sooner as needed.

## 2023-09-19 NOTE — Assessment & Plan Note (Signed)
 Chronic, above goal Continue amlodipine 10 mg daily, will transition from lisinopril to telmisartan to see if she has better effect from this.  Start telmisartan 40 mg daily.  Patient told to return to lab for repeat metabolic panel 7 to 10 days after starting telmisartan.  We will also check urine for albuminuria at that time.  Follow-up in 1 month for blood pressure check.

## 2023-09-19 NOTE — Assessment & Plan Note (Signed)
 Chronic, A1c at goal Patient was to focus on lifestyle modification, she was encouraged to follow healthy diet. Continue atorvastatin and ARB. Check for albuminuria at next lab draw.

## 2023-09-19 NOTE — Telephone Encounter (Signed)
 Pharmacy Patient Advocate Encounter   Received notification from CoverMyMeds that prior authorization for  Estradiol 0.1MG /GM cream is required/requested.   Insurance verification completed.   The patient is insured through [facility] [place] .   Per test claim: PA required; PA submitted to above mentioned insurance via CoverMyMeds Key/confirmation #/EOC [id number] Status is pending

## 2023-09-19 NOTE — Patient Instructions (Addendum)
 STOP LISINOPRIL. Return to lab in 7-10 days after starting the TELMISARTAN. No need to fast.   Vaginal cream - apply vaginally once a day at bedtime for 2 weeks. Then you will reduce application to two times per week at bedtime.

## 2023-09-19 NOTE — Assessment & Plan Note (Signed)
 Chronic Continue atorvastatin 80 mg daily, last LDL slightly above goal.  Consider adding additional agent at next office visit.

## 2023-09-20 NOTE — Telephone Encounter (Signed)
 Pharmacy Patient Advocate Encounter  Received notification from [facility] [employer] that Prior Authorization for Estradiol 0.1MG /GM cream has been DENIED.  Full denial letter will be uploaded to the media tab. See denial reason below.   PA #/Case ID/Reference #:  [id number]

## 2023-09-24 NOTE — Telephone Encounter (Signed)
 Copied from CRM [id number]. Topic: Clinical - Prescription Issue >> Sep 24, 2023  9:25 AM Ivette P wrote: Reason for CRM: PT called in about following up on prior auth for the  following medications:  estradiol  (ESTRACE ) 0.1 MG/GM vaginal cream   telmisartan  (MICARDIS ) 40 MG tablet   Pt never received a response and would like an update. Pt requesting follow up [phone]

## 2023-09-26 ENCOUNTER — Other Ambulatory Visit (HOSPITAL_COMMUNITY): Payer: Self-pay

## 2023-09-26 ENCOUNTER — Telehealth: Payer: Self-pay | Admitting: Pharmacy Technician

## 2023-09-26 NOTE — Telephone Encounter (Signed)
 Pharmacy Patient Advocate Encounter   Received notification from Onbase that prior authorization for TELMISARTAN  40MG  TABLETS is required/requested.   Insurance verification completed.   The patient is insured through [facility] [place] .   Per test claim:  SEE BELOW is preferred by the insurance.  If suggested medication is appropriate, Please send in a new RX and discontinue this one. If not, please advise as to why it's not appropriate so that we may request a Prior Authorization. Please note, some preferred medications may still require a PA.  If the suggested medications have not been trialed and there are no contraindications to their use, the PA will not be submitted, as it will not be approved.

## 2023-09-27 ENCOUNTER — Other Ambulatory Visit: Payer: Self-pay | Admitting: Nurse Practitioner

## 2023-09-27 DIAGNOSIS — E119 Type 2 diabetes mellitus without complications: Secondary | ICD-10-CM

## 2023-09-27 DIAGNOSIS — I10 Essential (primary) hypertension: Secondary | ICD-10-CM

## 2023-09-27 MED ORDER — OLMESARTAN MEDOXOMIL 20 MG PO TABS: 20.0000 mg | ORAL_TABLET | Freq: Every day | ORAL | 0 refills | Status: DC

## 2023-10-26 ENCOUNTER — Ambulatory Visit: Admitting: Nurse Practitioner

## 2023-11-02 ENCOUNTER — Telehealth: Payer: Self-pay | Admitting: Nurse Practitioner

## 2023-11-02 NOTE — Telephone Encounter (Signed)
 Copied from CRM [id number]. Topic: Referral - Question >> Nov 01, 2023  3:59 PM Deaijah H wrote: Reason for CRM: Patient would like to know what the OBGYN referral appointment is for & would like to have the nurse give her call.

## 2023-11-02 NOTE — Telephone Encounter (Signed)
 Copied from CRM [id number]. Topic: Referral - Question >> Nov 02, 2023  2:16 PM Abigail D wrote: Patient returning calling back to return Grace's call. Patient will be available for call back when she is free.

## 2023-11-07 ENCOUNTER — Other Ambulatory Visit: Payer: Self-pay

## 2023-11-07 DIAGNOSIS — I10 Essential (primary) hypertension: Secondary | ICD-10-CM

## 2023-11-07 MED ORDER — AMLODIPINE BESYLATE 10 MG PO TABS: 10.0000 mg | ORAL_TABLET | Freq: Every day | ORAL | 3 refills | Status: DC

## 2023-11-07 NOTE — Telephone Encounter (Signed)
 Called pt and made her aware of the reason referral was placed and also pt requested refill of her amlodipine  which was send in.

## 2023-11-08 ENCOUNTER — Ambulatory Visit: Admitting: Nurse Practitioner

## 2023-11-15 ENCOUNTER — Other Ambulatory Visit: Payer: Self-pay | Admitting: Internal Medicine

## 2023-11-15 DIAGNOSIS — I10 Essential (primary) hypertension: Secondary | ICD-10-CM

## 2023-11-19 ENCOUNTER — Encounter: Payer: Self-pay | Admitting: "Endocrinology

## 2023-11-19 ENCOUNTER — Ambulatory Visit (INDEPENDENT_AMBULATORY_CARE_PROVIDER_SITE_OTHER): Payer: 59 | Admitting: "Endocrinology

## 2023-11-19 VITALS — BP 130/86 | HR 72 | Ht 63.0 in | Wt 201.6 lb

## 2023-11-19 DIAGNOSIS — E119 Type 2 diabetes mellitus without complications: Secondary | ICD-10-CM | POA: Diagnosis not present

## 2023-11-19 DIAGNOSIS — E890 Postprocedural hypothyroidism: Secondary | ICD-10-CM

## 2023-11-19 NOTE — Progress Notes (Signed)
 Endocrinology Consult Note                                            11/19/2023, 12:42 PM   Subjective:    Patient ID: Jodi Wade, female    DOB: 05-Jul-1960, PCP Zorita Hiss, NP   Past Medical History:  Diagnosis Date   Allergy     seasonal   Anemia    Anxiety    Arthritis    knee right   Diabetes mellitus without complication (HCC)    pt.denies,don't   GERD (gastroesophageal reflux disease)    Heart murmur    Hyperlipidemia    controlled   Hypertension    Thyroid  disease    Past Surgical History:  Procedure Laterality Date   BREAST BIOPSY Right    COLONOSCOPY     KNEE ARTHROSCOPY WITH MEDIAL MENISECTOMY Right 01/13/2020   Procedure: KNEE ARTHROSCOPY WITH MEDIAL AND LATERAL  MENISCECTOMY;  Surgeon: Darrin Emerald, MD;  Location: [facility];  Service: Orthopedics;  Laterality: Right;   PARTIAL HYSTERECTOMY     abdominal   Social History   Socioeconomic History   Marital status: Widowed    Spouse name: Not on file   Number of children: Not on file   Years of education: Not on file   Highest education level: Some college, no degree  Occupational History   Not on file  Tobacco Use   Smoking status: Never    Passive exposure: Current (husband smokes)   Smokeless tobacco: Never  Vaping Use   Vaping status: Never Used  Substance and Sexual Activity   Alcohol use: Yes    Comment: Social   Drug use: No   Sexual activity: Yes  Other Topics Concern   Not on file  Social History Narrative   Not on file   Social Drivers of Health   Financial Resource Strain: Low Risk  (09/18/2023)   Overall Financial Resource Strain (CARDIA)    Difficulty of Paying Living Expenses: Not hard at all  Food Insecurity: No Food Insecurity (09/18/2023)   Hunger Vital Sign    Worried About Running Out of Food in the Last Year: Never true    Ran Out of Food in the Last Year: Never true  Transportation Needs: No Transportation Needs (09/18/2023)   PRAPARE -  Administrator, Civil Service (Medical): No    Lack of Transportation (Non-Medical): No  Physical Activity: Insufficiently Active (09/18/2023)   Exercise Vital Sign    Days of Exercise per Week: 4 days    Minutes of Exercise per Session: 30 min  Stress: No Stress Concern Present (09/18/2023)   [employer] of Occupational Health - Occupational Stress Questionnaire    Feeling of Stress : Not at all  Social Connections: Moderately Isolated (09/18/2023)   Social Connection and Isolation Panel    Frequency of Communication with Friends and Family: More than three times a week    Frequency of Social Gatherings with Friends and Family: Once a week    Attends Religious Services: 1 to 4 times per year    Active Member of [employer] or Organizations: No    Attends Banker Meetings: Not on file    Marital Status: Widowed   Family History  Problem Relation Age of Onset   Cancer Mother    Breast  cancer Mother    Thyroid  disease Mother    Allergic rhinitis Father    Thyroid  disease Maternal Aunt    Thyroid  disease Son        Hyperthyroid, not treated   Diabetes Other    Hypertension Other    Colon cancer Neg Hx    Colon polyps Neg Hx    Crohn's disease Neg Hx    Esophageal cancer Neg Hx    Rectal cancer Neg Hx    Stomach cancer Neg Hx    Ulcerative colitis Neg Hx    Outpatient Encounter Medications as of 11/19/2023  Medication Sig   atorvastatin  (LIPITOR) 80 MG tablet Take 1 tablet (80 mg total) by mouth daily. (Patient taking differently: Take 40 mg by mouth daily.)   Cholecalciferol (VITAMIN D -3 PO) Take 1 tablet by mouth daily.   FIBER GUMMIES PO Take by mouth daily.   amLODipine  (NORVASC ) 10 MG tablet Take 1 tablet (10 mg total) by mouth daily.   EPINEPHrine  (EPIPEN  2-PAK) 0.3 mg/0.3 mL IJ SOAJ injection Inject 0.3 mg into the muscle as needed for anaphylaxis. (Patient not taking: Reported on 11/19/2023)   estradiol  (ESTRACE ) 0.1 MG/GM vaginal cream Apply 1  application vaginally at bedtime daily x 2 weeks. Then reduce application to 1 application vaginally at bedtime two times per week. (Patient not taking: Reported on 11/19/2023)   levothyroxine  (SYNTHROID ) 88 MCG tablet Take 1 tablet (88 mcg total) by mouth daily.   metroNIDAZOLE  (FLAGYL ) 500 MG tablet Take 1 tablet (500 mg total) by mouth 2 (two) times daily. (Patient not taking: Reported on 11/19/2023)   Multiple Vitamin (MULTIVITAMIN ADULT PO) Take by mouth daily. (Patient not taking: Reported on 11/19/2023)   olmesartan  (BENICAR ) 20 MG tablet Take 1 tablet (20 mg total) by mouth daily.   pantoprazole  (PROTONIX ) 40 MG tablet Take 1 tablet (40 mg total) by mouth daily as needed.   No facility-administered encounter medications on file as of 11/19/2023.   ALLERGIES: Allergies  Allergen Reactions   Fexofenadine-Pseudoephed Er Other (See Comments)    fast heart rate   Fexofenadine-Pseudoephed Er Other (See Comments)    Other Reaction(s): HEART RACE   Hydrochlorothiazide Other (See Comments)    Hypokalemia   Latex Hives and Rash   Pseudoephedrine Other (See Comments)    Heart Rate increases    VACCINATION STATUS: Immunization History  Administered Date(s) Administered   Influenza-Unspecified 03/24/2020   PFIZER(Purple Top)SARS-COV-2 Vaccination 07/18/2019, 08/08/2019    HPI Jodi Wade is 63 y.o. female who presents today with a medical history as above. she is being seen in consultation for RAI induced hypothyroidism requested by Zorita Hiss, NP.  History is obtained directly from the patient as well as her chart review.  She was following with Dr. Hubert Madden in [place] until he retired.  Her history includes radioactive iodine thyroid  ablation in 2019 for toxic multinodular goiter. She was given various dose of levothyroxine  over the years.  She is currently on levothyroxine  88 mcg p.o. daily before breakfast.  She reports good compliance and consistency taking her medication.  She  does not have recent thyroid  function test.  TSH in March 2025 was on target. She also has type 2 diabetes with recent A1c of 6.6%.  She is only on diet and exercise program at this time.  She denies any family history of thyroid  dysfunction or thyroid  malignancy. Her other medical problems include hypertension, hyperlipidemia.  Her other medications include amlodipine  10 mg p.o. daily, atorvastatin   40 mg p.o. nightly, vitamin D3, Benicar  20 mg p.o. daily.   Review of Systems  Constitutional: +mildly fluctuating body weight , no fatigue, no subjective hyperthermia, no subjective hypothermia Eyes: no blurry vision, no xerophthalmia ENT: no sore throat, no nodules palpated in throat, no dysphagia/odynophagia, no hoarseness Cardiovascular: no Chest Pain, no Shortness of Breath, no palpitations, no leg swelling Respiratory: no cough, no shortness of breath Gastrointestinal: no Nausea/Vomiting/Diarhhea Musculoskeletal: no muscle/joint aches Skin: no rashes Neurological: no tremors, no numbness, no tingling, no dizziness Psychiatric: no depression, no anxiety  Objective:       11/19/2023    9:17 AM 09/19/2023   10:55 AM 08/17/2023   10:56 AM  Vitals with BMI  Height 5' 3 5' 3 5' 3  Weight 201 lbs 10 oz 204 lbs 202 lbs  BMI 35.72 36.15 35.79  Systolic 130 136 161  Diastolic 86 88 84  Pulse 72 78 82    BP 130/86   Pulse 72   Ht 5' 3 (1.6 m)   Wt 201 lb 9.6 oz (91.4 kg)   BMI 35.71 kg/m   Wt Readings from Last 3 Encounters:  11/19/23 201 lb 9.6 oz (91.4 kg)  09/19/23 204 lb (92.5 kg)  08/17/23 202 lb (91.6 kg)    Physical Exam  Constitutional:  Body mass index is 35.71 kg/m.,  not in acute distress, normal state of mind Eyes: PERRLA, EOMI, no exophthalmos ENT: moist mucous membranes, no gross thyromegaly, no gross cervical lymphadenopathy Cardiovascular: normal precordial activity, Regular Rate and Rhythm, no Murmur/Rubs/Gallops Respiratory:  adequate breathing efforts, no  gross chest deformity, Clear to auscultation bilaterally Gastrointestinal: abdomen soft, Non -tender, No distension, Bowel Sounds present, no gross organomegaly Musculoskeletal: no gross deformities, strength intact in all four extremities, no peripheral edema Skin: moist, warm, no rashes Neurological: no tremor with outstretched hands, Deep tendon reflexes normal in bilateral lower extremities.  CMP ( most recent) CMP     Component Value Date/Time   NA 141 08/17/2023 1131   NA 142 11/17/2022 1100   K 3.6 08/17/2023 1131   CL 105 08/17/2023 1131   CO2 26 08/17/2023 1131   GLUCOSE 105 (H) 08/17/2023 1131   BUN 10 08/17/2023 1131   BUN 10 11/17/2022 1100   CREATININE 0.66 08/17/2023 1131   CALCIUM  9.9 08/17/2023 1131   PROT 8.0 08/17/2023 1131   PROT 6.9 11/17/2022 1100   ALBUMIN 4.6 08/17/2023 1131   ALBUMIN 4.4 11/17/2022 1100   AST 14 08/17/2023 1131   ALT 13 08/17/2023 1131   ALKPHOS 80 08/17/2023 1131   BILITOT 0.7 08/17/2023 1131   BILITOT 0.4 11/17/2022 1100   GFR 93.68 08/17/2023 1131   EGFR 81 11/17/2022 1100   GFRNONAA >60 01/09/2020 1455     Diabetic Labs (most recent): Lab Results  Component Value Date   HGBA1C 6.6 (H) 08/17/2023   HGBA1C 6.4 (H) 11/17/2022   HGBA1C 6.3 (H) 01/09/2020   MICROALBUR 1.3 08/17/2023     Lipid Panel ( most recent) Lipid Panel     Component Value Date/Time   CHOL 179 08/17/2023 1131   CHOL 220 (H) 11/17/2022 1100   TRIG 77.0 08/17/2023 1131   HDL 48.50 08/17/2023 1131   HDL 55 11/17/2022 1100   CHOLHDL 4 08/17/2023 1131   VLDL 15.4 08/17/2023 1131   LDLCALC 115 (H) 08/17/2023 1131   LDLCALC 147 (H) 11/17/2022 1100   LABVLDL 18 11/17/2022 1100      Lab Results  Component Value  Date   TSH 1.05 08/17/2023   TSH 3.19 11/13/2022   TSH 3.10 02/03/2022   TSH 4.050 06/28/2021   TSH 1.65 04/23/2020   FREET4 0.93 11/13/2022   FREET4 1.20 06/28/2021   FREET4 0.90 04/23/2020   FREET4 1.15 04/25/2019   FREET4 1.26  10/17/2018           Assessment & Plan:   1. Postablative hypothyroidism (Primary) 2. Type 2 diabetes mellitus without complication, without long-term current use of insulin (HCC)  - Jodi Wade  is being seen at a kind request of Zorita Hiss, NP. - I have reviewed her available  records and clinically evaluated the patient. - Based on these reviews, she has RAI induced hypothyroidism, type 2 diabetes,  however,  there is not sufficient recent information to make any treatment adjustment.  She is advised to continue her levothyroxine  88 mcg p.o. daily before breakfast until next visit.  She will have TSH and free T4 measured today.  Regarding her type 2 diabetes, not on treatment.  Exercise and avoiding processed carbs were discussed with her.  Lifestyle medicine package will be provided next visit.   She is advised to continue her current medications for hypertension and hyperlipidemia.  - she is advised to maintain close follow up with Zorita Hiss, NP for primary care needs.   -Thank you for involving me in the care of this pleasant patient.  Time spent with the patient: 45  minutes spent in  counseling her about hypothyroidism, type 2 diabetes and the rest in obtaining information about her symptoms, reviewing her previous labs/studies (including abstractions from other facilities),  evaluations, and treatments,  and developing a plan to confirm diagnosis and long term treatment based on the latest standards of care/guidelines; and documenting her care.  Jodi Wade participated in the discussions, expressed understanding, and voiced agreement with the above plans.  All questions were answered to her satisfaction. she is encouraged to contact clinic should she have any questions or concerns prior to her return visit.  Follow up plan: Return in about 10 days (around 11/29/2023) for Labs Today- Non-Fasting Ok, A1c -NV.   Kalvin Orf, MD [facility] [address] [place], [place] [address] Phone: [phone]  Fax: [phone]     11/19/2023, 12:42 PM  This note was partially dictated with voice recognition software. Similar sounding words can be transcribed inadequately or may not  be corrected upon review.

## 2023-11-20 LAB — TSH: TSH: 1.61 u[IU]/mL (ref 0.450–4.500)

## 2023-11-20 LAB — T4, FREE: Free T4: 1.23 ng/dL (ref 0.82–1.77)

## 2023-11-22 ENCOUNTER — Other Ambulatory Visit: Payer: Self-pay | Admitting: Nurse Practitioner

## 2023-11-22 DIAGNOSIS — Z1231 Encounter for screening mammogram for malignant neoplasm of breast: Secondary | ICD-10-CM

## 2023-11-26 ENCOUNTER — Telehealth: Payer: Self-pay | Admitting: "Endocrinology

## 2023-11-26 DIAGNOSIS — E890 Postprocedural hypothyroidism: Secondary | ICD-10-CM

## 2023-11-26 DIAGNOSIS — E119 Type 2 diabetes mellitus without complications: Secondary | ICD-10-CM

## 2023-11-26 MED ORDER — LEVOTHYROXINE SODIUM 88 MCG PO TABS: 88.0000 ug | ORAL_TABLET | Freq: Every day | ORAL | 1 refills | Status: DC

## 2023-11-26 NOTE — Telephone Encounter (Signed)
 Rx refill for Levothyroxine  sent to [facility] on [address].

## 2023-11-26 NOTE — Telephone Encounter (Signed)
 Pt is requesting refill of levothyroxine  be sent into [facility] on [address]

## 2023-11-30 ENCOUNTER — Other Ambulatory Visit: Payer: Self-pay | Admitting: Family Medicine

## 2023-11-30 DIAGNOSIS — E890 Postprocedural hypothyroidism: Secondary | ICD-10-CM

## 2023-12-03 ENCOUNTER — Encounter: Payer: Self-pay | Admitting: "Endocrinology

## 2023-12-03 ENCOUNTER — Ambulatory Visit: Admitting: "Endocrinology

## 2023-12-03 VITALS — BP 134/84 | HR 72 | Ht 63.0 in | Wt 201.0 lb

## 2023-12-03 DIAGNOSIS — E890 Postprocedural hypothyroidism: Secondary | ICD-10-CM

## 2023-12-03 DIAGNOSIS — E119 Type 2 diabetes mellitus without complications: Secondary | ICD-10-CM | POA: Diagnosis not present

## 2023-12-03 DIAGNOSIS — I10 Essential (primary) hypertension: Secondary | ICD-10-CM | POA: Diagnosis not present

## 2023-12-03 DIAGNOSIS — E782 Mixed hyperlipidemia: Secondary | ICD-10-CM | POA: Diagnosis not present

## 2023-12-03 LAB — POCT GLYCOSYLATED HEMOGLOBIN (HGB A1C): HbA1c, POC (controlled diabetic range): 6.2 % (ref 0.0–7.0)

## 2023-12-03 NOTE — Progress Notes (Signed)
 12/03/2023, 12:48 PM  Endocrinology follow-up note   Subjective:    Patient ID: Jodi Wade, female    DOB: 06/13/1960, PCP Elnor Lauraine BRAVO, NP   Past Medical History:  Diagnosis Date   Allergy     seasonal   Anemia    Anxiety    Arthritis    knee right   Diabetes mellitus without complication (HCC)    pt.denies,don't   GERD (gastroesophageal reflux disease)    Heart murmur    Hyperlipidemia    controlled   Hypertension    Thyroid  disease    Past Surgical History:  Procedure Laterality Date   BREAST BIOPSY Right    COLONOSCOPY     KNEE ARTHROSCOPY WITH MEDIAL MENISECTOMY Right 01/13/2020   Procedure: KNEE ARTHROSCOPY WITH MEDIAL AND LATERAL  MENISCECTOMY;  Surgeon: Margrette Taft BRAVO, MD;  Location: [facility];  Service: Orthopedics;  Laterality: Right;   PARTIAL HYSTERECTOMY     abdominal   Social History   Socioeconomic History   Marital status: Widowed    Spouse name: Not on file   Number of children: Not on file   Years of education: Not on file   Highest education level: Some college, no degree  Occupational History   Not on file  Tobacco Use   Smoking status: Never    Passive exposure: Current (husband smokes)   Smokeless tobacco: Never  Vaping Use   Vaping status: Never Used  Substance and Sexual Activity   Alcohol use: Yes    Comment: Social   Drug use: No   Sexual activity: Yes  Other Topics Concern   Not on file  Social History Narrative   Not on file   Social Drivers of Health   Financial Resource Strain: Low Risk  (09/18/2023)   Overall Financial Resource Strain (CARDIA)    Difficulty of Paying Living Expenses: Not hard at all  Food Insecurity: No Food Insecurity (09/18/2023)   Hunger Vital Sign    Worried About Running Out of Food in the Last Year: Never true    Ran Out of Food in the Last Year: Never true  Transportation Needs: No Transportation Needs (09/18/2023)   PRAPARE -  Administrator, Civil Service (Medical): No    Lack of Transportation (Non-Medical): No  Physical Activity: Insufficiently Active (09/18/2023)   Exercise Vital Sign    Days of Exercise per Week: 4 days    Minutes of Exercise per Session: 30 min  Stress: No Stress Concern Present (09/18/2023)   [employer] of Occupational Health - Occupational Stress Questionnaire    Feeling of Stress : Not at all  Social Connections: Moderately Isolated (09/18/2023)   Social Connection and Isolation Panel    Frequency of Communication with Friends and Family: More than three times a week    Frequency of Social Gatherings with Friends and Family: Once a week    Attends Religious Services: 1 to 4 times per year    Active Member of [employer] or Organizations: No    Attends Banker Meetings: Not on file    Marital Status: Widowed   Family History  Problem Relation Age of Onset   Cancer Mother  Breast cancer Mother    Thyroid  disease Mother    Allergic rhinitis Father    Thyroid  disease Maternal Aunt    Thyroid  disease Son        Hyperthyroid, not treated   Diabetes Other    Hypertension Other    Colon cancer Neg Hx    Colon polyps Neg Hx    Crohn's disease Neg Hx    Esophageal cancer Neg Hx    Rectal cancer Neg Hx    Stomach cancer Neg Hx    Ulcerative colitis Neg Hx    Outpatient Encounter Medications as of 12/03/2023  Medication Sig   amLODipine  (NORVASC ) 10 MG tablet Take 1 tablet (10 mg total) by mouth daily.   atorvastatin  (LIPITOR) 80 MG tablet Take 1 tablet (80 mg total) by mouth daily. (Patient taking differently: Take 40 mg by mouth daily.)   Cholecalciferol (VITAMIN D -3 PO) Take 1 tablet by mouth daily.   EPINEPHrine  (EPIPEN  2-PAK) 0.3 mg/0.3 mL IJ SOAJ injection Inject 0.3 mg into the muscle as needed for anaphylaxis. (Patient not taking: Reported on 11/19/2023)   estradiol  (ESTRACE ) 0.1 MG/GM vaginal cream Apply 1 application vaginally at bedtime daily x 2  weeks. Then reduce application to 1 application vaginally at bedtime two times per week. (Patient not taking: Reported on 11/19/2023)   FIBER GUMMIES PO Take by mouth daily.   levothyroxine  (SYNTHROID ) 88 MCG tablet Take 1 tablet (88 mcg total) by mouth daily.   metroNIDAZOLE  (FLAGYL ) 500 MG tablet Take 1 tablet (500 mg total) by mouth 2 (two) times daily. (Patient not taking: Reported on 11/19/2023)   Multiple Vitamin (MULTIVITAMIN ADULT PO) Take by mouth daily. (Patient not taking: Reported on 11/19/2023)   olmesartan  (BENICAR ) 20 MG tablet Take 1 tablet (20 mg total) by mouth daily.   pantoprazole  (PROTONIX ) 40 MG tablet Take 1 tablet (40 mg total) by mouth daily as needed.   No facility-administered encounter medications on file as of 12/03/2023.   ALLERGIES: Allergies  Allergen Reactions   Fexofenadine-Pseudoephed Er Other (See Comments)    fast heart rate   Fexofenadine-Pseudoephed Er Other (See Comments)    Other Reaction(s): HEART RACE   Hydrochlorothiazide Other (See Comments)    Hypokalemia   Latex Hives and Rash   Pseudoephedrine Other (See Comments)    Heart Rate increases    VACCINATION STATUS: Immunization History  Administered Date(s) Administered   Influenza-Unspecified 03/24/2020   PFIZER(Purple Top)SARS-COV-2 Vaccination 07/18/2019, 08/08/2019    HPI Jodi Wade is 63 y.o. female who presents today with a medical history as above. she is being seen in follow-up after she was seen in consultation for RAI induced hypothyroidism requested by Elnor Lauraine BRAVO, NP.   - See notes from her prior visit. - She was following with Dr. Von in [place] until he retired.  Her history includes radioactive iodine thyroid  ablation in 2019 for toxic multinodular goiter. She was given various dose of levothyroxine  over the years.  She is currently on levothyroxine  88 mcg p.o. daily before breakfast.  Her previsit thyroid  function tests are consistent with appropriate  replacement.     She reports good compliance and consistency taking her medication.  She also has type 2 diabetes with point-of-care A1c today 6.2% improving from 6.6%.  She is not on medications.    She is only on diet and exercise program at this time.  She denies any family history of thyroid  dysfunction or thyroid  malignancy. Her other medical problems include hypertension, hyperlipidemia.  Her other medications include amlodipine  10 mg p.o. daily, atorvastatin  40 mg p.o. nightly, vitamin D3, Benicar  20 mg p.o. daily.   Review of Systems  Constitutional: +mildly fluctuating body weight , no fatigue, no subjective hyperthermia, no subjective hypothermia Eyes: no blurry vision, no xerophthalmia   Objective:       12/03/2023   10:31 AM 11/19/2023    9:17 AM 09/19/2023   10:55 AM  Vitals with BMI  Height 5' 3 5' 3 5' 3  Weight 201 lbs 201 lbs 10 oz 204 lbs  BMI 35.61 35.72 36.15  Systolic 134 130 863  Diastolic 84 86 88  Pulse 72 72 78    BP 134/84   Pulse 72   Ht 5' 3 (1.6 m)   Wt 201 lb (91.2 kg)   BMI 35.61 kg/m   Wt Readings from Last 3 Encounters:  12/03/23 201 lb (91.2 kg)  11/19/23 201 lb 9.6 oz (91.4 kg)  09/19/23 204 lb (92.5 kg)    Physical Exam  Constitutional:  Body mass index is 35.61 kg/m.,  not in acute distress, normal state of mind Eyes: PERRLA, EOMI, no exophthalmos   CMP ( most recent) CMP     Component Value Date/Time   NA 141 08/17/2023 1131   NA 142 11/17/2022 1100   K 3.6 08/17/2023 1131   CL 105 08/17/2023 1131   CO2 26 08/17/2023 1131   GLUCOSE 105 (H) 08/17/2023 1131   BUN 10 08/17/2023 1131   BUN 10 11/17/2022 1100   CREATININE 0.66 08/17/2023 1131   CALCIUM  9.9 08/17/2023 1131   PROT 8.0 08/17/2023 1131   PROT 6.9 11/17/2022 1100   ALBUMIN 4.6 08/17/2023 1131   ALBUMIN 4.4 11/17/2022 1100   AST 14 08/17/2023 1131   ALT 13 08/17/2023 1131   ALKPHOS 80 08/17/2023 1131   BILITOT 0.7 08/17/2023 1131   BILITOT 0.4 11/17/2022  1100   GFR 93.68 08/17/2023 1131   EGFR 81 11/17/2022 1100   GFRNONAA >60 01/09/2020 1455     Diabetic Labs (most recent): Lab Results  Component Value Date   HGBA1C 6.2 12/03/2023   HGBA1C 6.6 (H) 08/17/2023   HGBA1C 6.4 (H) 11/17/2022   MICROALBUR 1.3 08/17/2023     Lipid Panel ( most recent) Lipid Panel     Component Value Date/Time   CHOL 179 08/17/2023 1131   CHOL 220 (H) 11/17/2022 1100   TRIG 77.0 08/17/2023 1131   HDL 48.50 08/17/2023 1131   HDL 55 11/17/2022 1100   CHOLHDL 4 08/17/2023 1131   VLDL 15.4 08/17/2023 1131   LDLCALC 115 (H) 08/17/2023 1131   LDLCALC 147 (H) 11/17/2022 1100   LABVLDL 18 11/17/2022 1100      Lab Results  Component Value Date   TSH 1.610 11/19/2023   TSH 1.05 08/17/2023   TSH 3.19 11/13/2022   TSH 3.10 02/03/2022   TSH 4.050 06/28/2021   FREET4 1.23 11/19/2023   FREET4 0.93 11/13/2022   FREET4 1.20 06/28/2021   FREET4 0.90 04/23/2020   FREET4 1.15 04/25/2019      Assessment & Plan:   1. Postablative hypothyroidism (Primary) 2. Type 2 diabetes mellitus without complication, without long-term current use of insulin (HCC)   - I have reviewed her  new and available  records and clinically evaluated the patient. - Based on these reviews, she has RAI induced hypothyroidism, type 2 diabetes. Her previsit thyroid  function tests are consistent with appropriate replacement.  She is advised to continue levothyroxine   88 mcg p.o. daily before breakfast.   - We discussed about the correct intake of her thyroid  hormone, on empty stomach at fasting, with water, separated by at least 30 minutes from breakfast and other medications,  and separated by more than 4 hours from calcium , iron, multivitamins, acid reflux medications (PPIs). -Patient is made aware of the fact that thyroid  hormone replacement is needed for life, dose to be adjusted by periodic monitoring of thyroid  function tests.  Regarding her type 2 diabetes, not on medications.   Her point of A1c 6.2% today, September target.      Exercise and avoiding processed carbs were discussed with her.  Lifestyle medicine package will be provided next visit.   She is advised to continue her current medications for hypertension and hyperlipidemia-including amlodipine  10 mg daily, atorvastatin  40 mg p.o. daily, Benicar  20 mg p.o. daily..  - she is advised to maintain close follow up with Elnor Lauraine BRAVO, NP for primary care needs.   I spent  26  minutes in the care of the patient today including review of labs from Thyroid  Function, CMP, and other relevant labs ; imaging/biopsy records (current and previous including abstractions from other facilities); face-to-face time discussing  her lab results and symptoms, medications doses, her options of short and long term treatment based on the latest standards of care / guidelines;   and documenting the encounter.  Jodi Wade  participated in the discussions, expressed understanding, and voiced agreement with the above plans.  All questions were answered to her satisfaction. she is encouraged to contact clinic should she have any questions or concerns prior to her return visit.  Follow up plan: Return in about 6 months (around 06/03/2024) for Fasting Labs  in AM B4 8, A1c -NV.   Ranny Earl, MD [facility] [address] [place], [place] [address] Phone: [phone]  Fax: [phone]     12/03/2023, 12:48 PM  This note was partially dictated with voice recognition software. Similar sounding words can be transcribed inadequately or may not  be corrected upon review.

## 2023-12-26 ENCOUNTER — Telehealth: Payer: Self-pay

## 2023-12-26 NOTE — Telephone Encounter (Signed)
 Called pt and made her aware that she will need an office  visit for antibiotics. Pt is schedule with Dr. Norleen on 12/27/23 for UTI symptoms

## 2023-12-26 NOTE — Telephone Encounter (Signed)
 Copied from CRM [id number]. Topic: General - Other >> Dec 26, 2023  3:17 PM Suzen RAMAN wrote: Reason for CRM: patient believes she has a UTI and currently experiencing urine frequency but denies any pain. She would like a call back from provider nurse to further discuss.   RA#[phone]

## 2023-12-27 ENCOUNTER — Ambulatory Visit (INDEPENDENT_AMBULATORY_CARE_PROVIDER_SITE_OTHER): Admitting: Internal Medicine

## 2023-12-27 VITALS — BP 128/78 | HR 88 | Temp 99.1°F | Ht 63.0 in | Wt 200.4 lb

## 2023-12-27 DIAGNOSIS — E119 Type 2 diabetes mellitus without complications: Secondary | ICD-10-CM | POA: Diagnosis not present

## 2023-12-27 DIAGNOSIS — I10 Essential (primary) hypertension: Secondary | ICD-10-CM | POA: Diagnosis not present

## 2023-12-27 DIAGNOSIS — R300 Dysuria: Secondary | ICD-10-CM | POA: Diagnosis not present

## 2023-12-27 LAB — MICROALBUMIN / CREATININE URINE RATIO
Creatinine,U: 94.6 mg/dL
Microalb Creat Ratio: 485.7 mg/g — ABNORMAL HIGH (ref 0.0–30.0)
Microalb, Ur: 45.9 mg/dL — ABNORMAL HIGH (ref 0.0–1.9)

## 2023-12-27 MED ORDER — CIPROFLOXACIN HCL 500 MG PO TABS: 500.0000 mg | ORAL_TABLET | Freq: Two times a day (BID) | ORAL | 0 refills | Status: AC

## 2023-12-27 NOTE — Assessment & Plan Note (Signed)
 C/w likely acute cystitis without complication - for ua and cx, also empiric cipro  500 bid x 10 days

## 2023-12-27 NOTE — Assessment & Plan Note (Signed)
 BP Readings from Last 3 Encounters:  12/27/23 128/78  12/03/23 134/84  11/19/23 130/86   Stable, pt to continue medical treatment norvasc  10 every day, benicar  20 qd

## 2023-12-27 NOTE — Progress Notes (Signed)
 Patient ID: Jodi Wade, female   DOB: 06-29-1960, 63 y.o.   MRN: [id number]        Chief Complaint: follow up possible UTI       HPI:  Jodi Wade is a 63 y.o. female here with c/o 3 days onset dysuria and frequency, o/w Denies urinary symptoms such as urgency, flank pain, hematuria or n/v, fever, chills.  Azo yesterday helped the pain but back again today  .;cpj   Pt denies polydipsia, polyuria, or new focal neuro s/s.          Wt Readings from Last 3 Encounters:  12/27/23 200 lb 6.4 oz (90.9 kg)  12/03/23 201 lb (91.2 kg)  11/19/23 201 lb 9.6 oz (91.4 kg)   BP Readings from Last 3 Encounters:  12/27/23 128/78  12/03/23 134/84  11/19/23 130/86         Past Medical History:  Diagnosis Date   Allergy     seasonal   Anemia    Anxiety    Arthritis    knee right   Diabetes mellitus without complication (HCC)    pt.denies,don't   GERD (gastroesophageal reflux disease)    Heart murmur    Hyperlipidemia    controlled   Hypertension    Thyroid  disease    Past Surgical History:  Procedure Laterality Date   BREAST BIOPSY Right    COLONOSCOPY     KNEE ARTHROSCOPY WITH MEDIAL MENISECTOMY Right 01/13/2020   Procedure: KNEE ARTHROSCOPY WITH MEDIAL AND LATERAL  MENISCECTOMY;  Surgeon: Margrette Taft BRAVO, MD;  Location: [facility];  Service: Orthopedics;  Laterality: Right;   PARTIAL HYSTERECTOMY     abdominal    reports that she has never smoked. She has been exposed to tobacco smoke. She has never used smokeless tobacco. She reports current alcohol use. She reports that she does not use drugs. family history includes Allergic rhinitis in her father; Breast cancer in her mother; Cancer in her mother; Diabetes in an other family member; Hypertension in an other family member; Thyroid  disease in her maternal aunt, mother, and son. Allergies  Allergen Reactions   Fexofenadine-Pseudoephed Er Other (See Comments)    fast heart rate   Fexofenadine-Pseudoephed Er Other (See  Comments)    Other Reaction(s): HEART RACE   Hydrochlorothiazide Other (See Comments)    Hypokalemia   Latex Hives and Rash   Pseudoephedrine Other (See Comments)    Heart Rate increases   Current Outpatient Medications on File Prior to Visit  Medication Sig Dispense Refill   amLODipine  (NORVASC ) 10 MG tablet Take 1 tablet (10 mg total) by mouth daily. 90 tablet 3   atorvastatin  (LIPITOR) 80 MG tablet Take 1 tablet (80 mg total) by mouth daily. 90 tablet 3   Cholecalciferol (VITAMIN D -3 PO) Take 1 tablet by mouth daily.     FIBER GUMMIES PO Take by mouth daily.     levothyroxine  (SYNTHROID ) 88 MCG tablet Take 1 tablet (88 mcg total) by mouth daily. 90 tablet 1   olmesartan  (BENICAR ) 20 MG tablet Take 1 tablet (20 mg total) by mouth daily. 90 tablet 0   pantoprazole  (PROTONIX ) 40 MG tablet Take 1 tablet (40 mg total) by mouth daily as needed. 30 tablet 2   EPINEPHrine  (EPIPEN  2-PAK) 0.3 mg/0.3 mL IJ SOAJ injection Inject 0.3 mg into the muscle as needed for anaphylaxis. (Patient not taking: Reported on 12/27/2023) 2 each 1   estradiol  (ESTRACE ) 0.1 MG/GM vaginal cream Apply 1 application vaginally at bedtime  daily x 2 weeks. Then reduce application to 1 application vaginally at bedtime two times per week. (Patient not taking: Reported on 12/27/2023) 42.5 g 12   metroNIDAZOLE  (FLAGYL ) 500 MG tablet Take 1 tablet (500 mg total) by mouth 2 (two) times daily. (Patient not taking: Reported on 11/19/2023) 14 tablet 0   Multiple Vitamin (MULTIVITAMIN ADULT PO) Take by mouth daily. (Patient not taking: Reported on 12/27/2023)     No current facility-administered medications on file prior to visit.        ROS:  All others reviewed and negative.  Objective        PE:  BP 128/78   Pulse 88   Temp 99.1 F (37.3 C)   Ht 5' 3 (1.6 m)   Wt 200 lb 6.4 oz (90.9 kg)   SpO2 99%   BMI 35.50 kg/m                 Constitutional: Pt appears in NAD               HENT: Head: NCAT.                Right  Ear: External ear normal.                 Left Ear: External ear normal.                Eyes: . Pupils are equal, round, and reactive to light. Conjunctivae and EOM are normal               Nose: without d/c or deformity               Neck: Neck supple. Gross normal ROM               Cardiovascular: Normal rate and regular rhythm.                 Pulmonary/Chest: Effort normal and breath sounds without rales or wheezing.                Abd:  Soft, tender low mid abdomen, ND, + BS, no organomegaly               Neurological: Pt is alert. At baseline orientation, motor grossly intact               Skin: Skin is warm. No rashes, no other new lesions, LE edema - none               Psychiatric: Pt behavior is normal without agitation   Micro: none  Cardiac tracings I have personally interpreted today:  none  Pertinent Radiological findings (summarize): none   Lab Results  Component Value Date   WBC 6.7 08/17/2023   HGB 14.5 08/17/2023   HCT 43.8 08/17/2023   PLT 360.0 08/17/2023   GLUCOSE 105 (H) 08/17/2023   CHOL 179 08/17/2023   TRIG 77.0 08/17/2023   HDL 48.50 08/17/2023   LDLCALC 115 (H) 08/17/2023   ALT 13 08/17/2023   AST 14 08/17/2023   NA 141 08/17/2023   K 3.6 08/17/2023   CL 105 08/17/2023   CREATININE 0.66 08/17/2023   BUN 10 08/17/2023   CO2 26 08/17/2023   TSH 1.610 11/19/2023   HGBA1C 6.2 12/03/2023   MICROALBUR 45.9 (H) 12/27/2023   Assessment/Plan:  Jodi Wade is a 63 y.o. Black or African American [2] female with  has a past medical history of Allergy , Anemia,  Anxiety, Arthritis, Diabetes mellitus without complication (HCC), GERD (gastroesophageal reflux disease), Heart murmur, Hyperlipidemia, Hypertension, and Thyroid  disease.  Dysuria C/w likely acute cystitis without complication - for ua and cx, also empiric cipro  500 bid x 10 days  Type 2 diabetes mellitus without complication (HCC) Lab Results  Component Value Date   HGBA1C 6.2 12/03/2023    Stable, pt to continue current medical treatment  - diet,wt control   Essential hypertension, benign BP Readings from Last 3 Encounters:  12/27/23 128/78  12/03/23 134/84  11/19/23 130/86   Stable, pt to continue medical treatment norvasc  10 every day, benicar  20 qd  Followup: Return if symptoms worsen or fail to improve.  Lynwood Rush, MD 12/27/2023 7:12 PM [facility]

## 2023-12-27 NOTE — Assessment & Plan Note (Signed)
 Lab Results  Component Value Date   HGBA1C 6.2 12/03/2023   Stable, pt to continue current medical treatment  - diet, wt control

## 2023-12-27 NOTE — Patient Instructions (Signed)
 Please take all new medication as prescribed- the antibiotic  Please continue all other medications as before, and refills have been done if requested.  Please have the pharmacy call with any other refills you may need.  Please keep your appointments with your specialists as you may have planned  Please go to the LAB at the blood drawing area for the tests to be done  - just the urine testing today  You will be contacted by phone if any changes need to be made immediately.  Otherwise, you will receive a letter about your results with an explanation, but please check with MyChart first.

## 2024-01-07 ENCOUNTER — Ambulatory Visit
Admission: RE | Admit: 2024-01-07 | Discharge: 2024-01-07 | Disposition: A | Discharge status: Home / Self Care | Source: Ambulatory Visit | Attending: Nurse Practitioner | Admitting: Nurse Practitioner

## 2024-01-07 DIAGNOSIS — Z1231 Encounter for screening mammogram for malignant neoplasm of breast: Secondary | ICD-10-CM

## 2024-01-17 ENCOUNTER — Ambulatory Visit: Admitting: Nurse Practitioner

## 2024-01-30 ENCOUNTER — Other Ambulatory Visit: Payer: Self-pay | Admitting: Nurse Practitioner

## 2024-01-30 DIAGNOSIS — I10 Essential (primary) hypertension: Secondary | ICD-10-CM

## 2024-01-30 DIAGNOSIS — E119 Type 2 diabetes mellitus without complications: Secondary | ICD-10-CM

## 2024-01-30 MED ORDER — OLMESARTAN MEDOXOMIL 20 MG PO TABS: 20.0000 mg | ORAL_TABLET | Freq: Every day | ORAL | 0 refills | Status: DC

## 2024-01-30 NOTE — Telephone Encounter (Signed)
 Copied from CRM [id number]. Topic: Clinical - Medication Refill >> Jan 30, 2024  8:45 AM Robinson H wrote: Medication: olmesartan  (BENICAR ) 20 MG tablet 90 day supply  Has the patient contacted their pharmacy? No, no refills on bottle (Agent: If no, request that the patient contact the pharmacy for the refill. If patient does not wish to contact the pharmacy document the reason why and proceed with request.) (Agent: If yes, when and what did the pharmacy advise?)  This is the patient's preferred pharmacy:  [id number] - [place], [place] - [address] AT [address] OF [address] [place] [address] Phone: [phone] Fax: [phone]   Is this the correct pharmacy for this prescription? Yes If no, delete pharmacy and type the correct one.   Has the prescription been filled recently? No  Is the patient out of the medication? Yes  Has the patient been seen for an appointment in the last year OR does the patient have an upcoming appointment? Yes  Can we respond through MyChart? Yes  Agent: Please be advised that Rx refills may take up to 3 business days. We ask that you follow-up with your pharmacy.

## 2024-01-31 ENCOUNTER — Ambulatory Visit: Admitting: Nurse Practitioner

## 2024-02-07 ENCOUNTER — Ambulatory Visit: Admitting: Nurse Practitioner

## 2024-02-07 VITALS — BP 126/78 | HR 71 | Temp 98.0°F | Ht 63.0 in

## 2024-02-07 DIAGNOSIS — E1169 Type 2 diabetes mellitus with other specified complication: Secondary | ICD-10-CM | POA: Diagnosis not present

## 2024-02-07 DIAGNOSIS — I872 Venous insufficiency (chronic) (peripheral): Secondary | ICD-10-CM

## 2024-02-07 DIAGNOSIS — E119 Type 2 diabetes mellitus without complications: Secondary | ICD-10-CM

## 2024-02-07 DIAGNOSIS — I10 Essential (primary) hypertension: Secondary | ICD-10-CM | POA: Diagnosis not present

## 2024-02-07 DIAGNOSIS — E785 Hyperlipidemia, unspecified: Secondary | ICD-10-CM | POA: Diagnosis not present

## 2024-02-07 LAB — CBC
HCT: 42.5 % (ref 36.0–46.0)
Hemoglobin: 14.1 g/dL (ref 12.0–15.0)
MCHC: 33.1 g/dL (ref 30.0–36.0)
MCV: 80.8 fl (ref 78.0–100.0)
Platelets: 330 K/uL (ref 150.0–400.0)
RBC: 5.25 Mil/uL — ABNORMAL HIGH (ref 3.87–5.11)
RDW: 15.2 % (ref 11.5–15.5)
WBC: 6 K/uL (ref 4.0–10.5)

## 2024-02-07 LAB — LIPID PANEL
Cholesterol: 175 mg/dL (ref 0–200)
HDL: 44.8 mg/dL (ref >39.00)
LDL Cholesterol: 110 mg/dL — ABNORMAL HIGH (ref 0–99)
NonHDL: 130.18
Total CHOL/HDL Ratio: 4
Triglycerides: 101 mg/dL (ref 0.0–149.0)
VLDL: 20.2 mg/dL (ref 0.0–40.0)

## 2024-02-07 LAB — COMPREHENSIVE METABOLIC PANEL WITH GFR
ALT: 18 U/L (ref 0–35)
AST: 18 U/L (ref 0–37)
Albumin: 4.5 g/dL (ref 3.5–5.2)
Alkaline Phosphatase: 77 U/L (ref 39–117)
BUN: 10 mg/dL (ref 6–23)
CO2: 29 meq/L (ref 19–32)
Calcium: 9.6 mg/dL (ref 8.4–10.5)
Chloride: 106 meq/L (ref 96–112)
Creatinine, Ser: 0.72 mg/dL (ref 0.40–1.20)
GFR: 89 mL/min (ref >60.00)
Glucose, Bld: 93 mg/dL (ref 70–99)
Potassium: 3.8 meq/L (ref 3.5–5.1)
Sodium: 143 meq/L (ref 135–145)
Total Bilirubin: 0.6 mg/dL (ref 0.2–1.2)
Total Protein: 7.9 g/dL (ref 6.0–8.3)

## 2024-02-07 MED ORDER — ATORVASTATIN CALCIUM 80 MG PO TABS: 80.0000 mg | ORAL_TABLET | Freq: Every day | ORAL | 3 refills | Status: DC

## 2024-02-07 MED ORDER — AMLODIPINE BESYLATE 10 MG PO TABS: 10.0000 mg | ORAL_TABLET | Freq: Every day | ORAL | 3 refills | Status: DC

## 2024-02-07 NOTE — Assessment & Plan Note (Signed)
 Hyperlipidemia Hyperlipidemia management with atorvastatin  80 mg daily. Lipid levels are slightly elevated. - Send refill for atorvastatin  80 mg daily. - Recheck lipid levels.

## 2024-02-07 NOTE — Assessment & Plan Note (Signed)
 Bilateral foot pain with swelling due to osteoarthritis and venous insufficiency Intermittent bilateral foot pain with swelling, likely due to osteoarthritis and venous insufficiency. Tylenol  has been effective in managing pain. - Recommend Tylenol  500 mg before work and in the evening as needed. - Advise ibuprofen  200-400 mg every 8 hours as needed if Tylenol  is insufficient, cautioning about potential renal harm with frequent use. - Suggest wearing mild compression socks during prolonged standing to reduce swelling.

## 2024-02-07 NOTE — Assessment & Plan Note (Signed)
 Type 2 diabetes mellitus Type 2 diabetes mellitus is well-controlled with a recent A1c of 6.6%.

## 2024-02-07 NOTE — Assessment & Plan Note (Signed)
 Blood pressure is well-controlled with current medication regimen. -Continue amlodipine  10 mg daily and olmesartan  20 mg daily - Send refill for amlodipine  10 mg daily. - Check renal function as part of hypertension management.

## 2024-02-07 NOTE — Progress Notes (Signed)
 Established Patient Office Visit  Subjective   Patient ID: Jodi Wade, female    DOB: Oct 26, 1960  Age: 63 y.o. MRN: [id number]  Chief Complaint  Patient presents with   Diabetes    Discussed the use of AI scribe software for clinical note transcription with the patient, who gave verbal consent to proceed.  History of Present Illness Jodi Wade is a 63 year old female with type 2 diabetes who presents with bilateral foot pain.  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, nature at [employer] and elevation provide relief - Occasional swelling of the feet, which subsides with rest - Uses Tylenol  for pain relief with good effect  Peripheral neuropathy symptoms - Burning pain in the feet - No mention of numbness or tingling  Type 2 diabetes mellitus - Recent A1c of 6.6% in March 2025 - No mention of hypoglycemic or hyperglycemic symptoms  Auditory disturbance - Buzzing sensation in right ear - MRI and audiology exam both normal per patient report  Hypertension and hyperlipidemia management - Takes olmesartan  20 mg once daily and amlodipine  10 mg once daily for blood pressure control - Takes atorvastatin  80 mg once daily for hyperlipidemia      ROS: see HPI    Objective:     BP 126/78   Pulse 71   Temp 98 F (36.7 C) (Temporal)   Ht 5' 3 (1.6 m)   SpO2 97%   BMI 35.50 kg/m  BP Readings from Last 3 Encounters:  02/07/24 126/78  12/27/23 128/78  12/03/23 134/84   Wt Readings from Last 3 Encounters:  12/27/23 200 lb 6.4 oz (90.9 kg)  12/03/23 201 lb (91.2 kg)  11/19/23 201 lb 9.6 oz (91.4 kg)      Physical Exam Vitals reviewed.  Constitutional:      General: She is not in acute distress.    Appearance: Normal appearance.  HENT:     Head: Normocephalic and  atraumatic.     Right Ear: Hearing, tympanic membrane and ear canal normal.     Left Ear: Hearing, tympanic membrane and ear canal normal.  Neck:     Vascular: No carotid bruit.  Cardiovascular:     Rate and Rhythm: Normal rate and regular rhythm.     Pulses: Normal pulses.     Heart sounds: Normal heart sounds.  Pulmonary:     Effort: Pulmonary effort is normal.     Breath sounds: Normal breath sounds.  Musculoskeletal:     Right lower leg: Edema present.     Left lower leg: Edema present.  Skin:    General: Skin is warm and dry.  Neurological:     General: No focal deficit present.     Mental Status: She is alert and oriented to person, place, and time.  Psychiatric:        Mood and Affect: Mood normal.        Behavior: Behavior normal.        Judgment: Judgment normal.      No results found for any visits on 02/07/24.    The 10-year ASCVD risk score (Arnett DK, et al., 2019) is: 16.9%    Assessment & Plan:   Problem List Items Addressed This Visit       Cardiovascular and Mediastinum   Essential hypertension,  benign   Blood pressure is well-controlled with current medication regimen. -Continue amlodipine  10 mg daily and olmesartan  20 mg daily - Send refill for amlodipine  10 mg daily. - Check renal function as part of hypertension management.      Relevant Medications   atorvastatin  (LIPITOR) 80 MG tablet   amLODipine  (NORVASC ) 10 MG tablet   Venous insufficiency - Primary   Bilateral foot pain with swelling due to osteoarthritis and venous insufficiency Intermittent bilateral foot pain with swelling, likely due to osteoarthritis and venous insufficiency. Tylenol  has been effective in managing pain. - Recommend Tylenol  500 mg before work and in the evening as needed. - Advise ibuprofen  200-400 mg every 8 hours as needed if Tylenol  is insufficient, cautioning about potential renal harm with frequent use. - Suggest wearing mild compression socks during  prolonged standing to reduce swelling.      Relevant Medications   atorvastatin  (LIPITOR) 80 MG tablet   amLODipine  (NORVASC ) 10 MG tablet     Endocrine   Type 2 diabetes mellitus without complication, without long-term current use of insulin (HCC)   Type 2 diabetes mellitus Type 2 diabetes mellitus is well-controlled with a recent A1c of 6.6%.      Relevant Medications   atorvastatin  (LIPITOR) 80 MG tablet   Hyperlipidemia associated with type 2 diabetes mellitus (HCC)   Relevant Medications   atorvastatin  (LIPITOR) 80 MG tablet   amLODipine  (NORVASC ) 10 MG tablet   Other Relevant Orders   Comprehensive metabolic panel with GFR   Lipid panel   CBC     Other   Hyperlipidemia (Chronic)   Relevant Medications   atorvastatin  (LIPITOR) 80 MG tablet   amLODipine  (NORVASC ) 10 MG tablet   Other Visit Diagnoses       Essential hypertension       Relevant Medications   atorvastatin  (LIPITOR) 80 MG tablet   amLODipine  (NORVASC ) 10 MG tablet   Other Relevant Orders   CBC      Assessment and Plan Assessment & Plan Bilateral foot pain with swelling due to osteoarthritis and venous insufficiency Intermittent bilateral foot pain with swelling, likely due to osteoarthritis and venous insufficiency. Tylenol  has been effective in managing pain. - Recommend Tylenol  500 mg before work and in the evening as needed. - Advise ibuprofen  200-400 mg every 8 hours as needed if Tylenol  is insufficient, cautioning about potential renal harm with frequent use. - Suggest wearing mild compression socks during prolonged standing to reduce swelling.  Type 2 diabetes mellitus Type 2 diabetes mellitus is well-controlled with a recent A1c of 6.6%.  Essential hypertension Blood pressure is well-controlled with current medication regimen. - Send refill for amlodipine  10 mg daily. - Check renal function as part of hypertension management.  Hyperlipidemia Hyperlipidemia management with atorvastatin   80 mg daily. Lipid levels are slightly elevated. - Send refill for atorvastatin  80 mg daily. - Recheck lipid levels.  Tinnitus, right ear Persistent tinnitus in the right ear. Previous MRI and audiology exam were normal. - Consider using a white noise machine if tinnitus becomes bothersome.     Return in about 3 months (around 05/08/2024) for F/U with Lauraine.    Lauraine FORBES Pereyra, NP

## 2024-02-08 ENCOUNTER — Ambulatory Visit: Payer: Self-pay | Admitting: Nurse Practitioner

## 2024-02-08 ENCOUNTER — Other Ambulatory Visit: Payer: Self-pay | Admitting: Nurse Practitioner

## 2024-02-08 DIAGNOSIS — E1169 Type 2 diabetes mellitus with other specified complication: Secondary | ICD-10-CM

## 2024-02-08 MED ORDER — EZETIMIBE 10 MG PO TABS: 10.0000 mg | ORAL_TABLET | Freq: Every day | ORAL | 1 refills | Status: DC

## 2024-02-19 ENCOUNTER — Telehealth: Payer: Self-pay | Admitting: Radiology

## 2024-02-19 NOTE — Telephone Encounter (Signed)
 Copied from CRM [id number]. Topic: Clinical - Medication Question >> Feb 19, 2024 11:06 AM Martinique E wrote: Reason for CRM: Patient questioning if she can decease the dosage of her atorvastatin  (LIPITOR) 80 MG tablet. Currently has an 80mg  tablet, but is wanting a 40mg  tablet instead. Callback number for patient is [phone].

## 2024-02-29 ENCOUNTER — Ambulatory Visit: Payer: Self-pay | Admitting: *Deleted

## 2024-02-29 NOTE — Telephone Encounter (Signed)
 FYI Only or Action Required?: Action required by provider: update on patient condition and please advise if different medication can be prescribed and if xray or referral needed.  Patient was last seen in primary care on 02/07/2024 by Jodi Lauraine BRAVO, NP.  Called Nurse Triage reporting Pain.  Symptoms began several weeks ago.  Interventions attempted: OTC medications: ibuprofen  tylenol , soaking feet in epsom salt.  Symptoms are: gradually worsening.  Triage Disposition: See PCP When Office is Open (Within 3 Days)  Patient/caregiver understands and will follow disposition?: No, wishes to speak with PCP             Copied from CRM #[id number]. Topic: Clinical - Red Word Triage >> Feb 29, 2024 12:28 PM Chiquita SQUIBB wrote: Red Word that prompted transfer to Nurse Triage: Patient is calling in stating her foot pain is not going away and comes and goes. Patient is requesting a stronger pain medication . Reason for Disposition  [1] MODERATE pain (e.g., interferes with normal activities, limping) AND [2] present > 3 days  Answer Assessment - Initial Assessment Questions Requesting if another medication can be prescribed. Last OV 02/07/24 and patient has been taking ibuprofen  alternating with tylenol  for bilateral foot pain top of foot left foot worse than right foot. Requesting stronger pain medication. Stands all day at work. Requesting if xray needed. Please advise if another appt needed. Patient would like call back.      1. ONSET: When did the pain start?      Started when last seen on 02/07/24 2. LOCATION: Where is the pain located?      Bilateral feet 3. PAIN: How bad is the pain?    (Scale 1-10; or mild, moderate, severe)     8/10 4. WORK OR EXERCISE: Has there been any recent work or exercise that involved this part of the body?      Has to stand all day during work  5. CAUSE: What do you think is causing the foot pain?     Not sure  6. OTHER SYMPTOMS: Do you have any  other symptoms? (e.g., leg pain, rash, fever, numbness)     Top of foot and ankle pain . Pain down back to feet left side.  7. PREGNANCY: Is there any chance you are pregnant? When was your last menstrual period?     na  Protocols used: Foot Pain-A-AH

## 2024-03-03 ENCOUNTER — Other Ambulatory Visit: Payer: Self-pay | Admitting: Family

## 2024-03-03 DIAGNOSIS — I10 Essential (primary) hypertension: Secondary | ICD-10-CM

## 2024-03-03 DIAGNOSIS — E119 Type 2 diabetes mellitus without complications: Secondary | ICD-10-CM

## 2024-03-05 ENCOUNTER — Telehealth: Payer: Self-pay

## 2024-03-05 ENCOUNTER — Other Ambulatory Visit: Payer: Self-pay | Admitting: Nurse Practitioner

## 2024-03-05 DIAGNOSIS — E119 Type 2 diabetes mellitus without complications: Secondary | ICD-10-CM

## 2024-03-05 DIAGNOSIS — I10 Essential (primary) hypertension: Secondary | ICD-10-CM

## 2024-03-05 MED ORDER — OLMESARTAN MEDOXOMIL 20 MG PO TABS: 20.0000 mg | ORAL_TABLET | Freq: Every day | ORAL | 3 refills | Status: DC

## 2024-03-05 NOTE — Telephone Encounter (Unsigned)
 Copied from CRM #[id number]. Topic: Clinical - Prescription Issue >> Mar 05, 2024 11:46 AM Jodi Wade wrote: Reason for CRM: Patient states that this prescription did not have a 90-day supply because she has only been taking it once per day and she is out of it for 3 days.  olmesartan  (BENICAR ) 20 MG tablet  She thinks this is because of the pharmacy and would like to change because of it. She would like to use the following pharmacy:  [id number] - [place], [place] - [address] AT [facility] [address] [place] [address] Phone: [phone] Fax: [phone] Hours: Not open 24 hours

## 2024-03-05 NOTE — Progress Notes (Signed)
 refill

## 2024-03-11 ENCOUNTER — Other Ambulatory Visit: Payer: Self-pay | Admitting: Family

## 2024-03-11 ENCOUNTER — Telehealth: Payer: Self-pay

## 2024-03-11 DIAGNOSIS — E119 Type 2 diabetes mellitus without complications: Secondary | ICD-10-CM

## 2024-03-11 DIAGNOSIS — I10 Essential (primary) hypertension: Secondary | ICD-10-CM

## 2024-03-11 NOTE — Telephone Encounter (Signed)
 Copied from CRM [id number]. Topic: Clinical - Prescription Issue >> Mar 11, 2024  9:17 AM Kevelyn M wrote: Reason for CRM: Patient calling in because the pharmacy only gave her 30 pills when prescription called for 90 pills for a 90 day supply. They are not giving her the rest of her pills. Medication was sent to [facility], and now [facility] because the insurance want fill it because her insurance won't pay for it until November 21st, 2025. Please advise.    Call back# [phone]

## 2024-03-11 NOTE — Telephone Encounter (Signed)
 Copied from CRM [id number]. Topic: Clinical - Prescription Issue >> Mar 11, 2024  9:17 AM Jodi Wade wrote: Reason for CRM: Patient calling in because the pharmacy only gave her 30 pills when prescription called for 90 pills for a 90 day supply. They are not giving her the rest of her pills. Medication was sent to [facility], and [facility] because the insurance want fill it because her insurance won't pay for it until November 21st, 2025. Please advise.    Call back# [phone] >> Mar 11, 2024  2:41 PM Jodi Wade wrote: Patient called stating the [facility] pharmacy need a new script for olmesartan  in order for them to fix her prescription  [phone]

## 2024-03-12 ENCOUNTER — Telehealth: Payer: Self-pay

## 2024-03-12 NOTE — Telephone Encounter (Signed)
 Issue address in different encounter

## 2024-03-12 NOTE — Telephone Encounter (Signed)
 Called pt and left detail message for her in regards to the refill, we will not be able to till the November 21st that her insurance will cover and to call us  back.

## 2024-03-12 NOTE — Telephone Encounter (Signed)
 Copied from CRM [id number]. Topic: General - Other >> Mar 12, 2024 12:04 PM Burnard DEL wrote: Reason for CRM: Patient returned call to [facility],would like for a return call regarding her medication.She would like to know what she should take in the mean time?

## 2024-06-03 ENCOUNTER — Ambulatory Visit: Admitting: "Endocrinology

## 2024-06-07 ENCOUNTER — Other Ambulatory Visit: Payer: Self-pay | Admitting: "Endocrinology

## 2024-06-07 DIAGNOSIS — E890 Postprocedural hypothyroidism: Secondary | ICD-10-CM

## 2024-06-09 ENCOUNTER — Other Ambulatory Visit: Payer: Self-pay | Admitting: Nurse Practitioner

## 2024-06-09 ENCOUNTER — Other Ambulatory Visit: Payer: Self-pay

## 2024-06-09 ENCOUNTER — Telehealth: Payer: Self-pay

## 2024-06-09 DIAGNOSIS — E890 Postprocedural hypothyroidism: Secondary | ICD-10-CM

## 2024-06-09 MED ORDER — LEVOTHYROXINE SODIUM 88 MCG PO TABS: 88.0000 ug | ORAL_TABLET | Freq: Every day | ORAL | 0 refills | Status: DC

## 2024-06-09 NOTE — Telephone Encounter (Signed)
 Copied from CRM [id number]. Topic: Clinical - Medication Refill >> Jun 09, 2024  1:43 PM Rea C wrote: Medication: levothyroxine  (SYNTHROID ) 88 MCG tablet  Has the patient contacted their pharmacy? Yes (Agent: If no, request that the patient contact the pharmacy for the refill. If patient does not wish to contact the pharmacy document the reason why and proceed with request.) (Agent: If yes, when and what did the pharmacy advise?)  This is the patient's preferred pharmacy:  [facility] [address], [place] - [address] [place] [address] Phone: [phone] Fax: [phone]  Is this the correct pharmacy for this prescription? Yes If no, delete pharmacy and type the correct one.   Has the prescription been filled recently? No  Is the patient out of the medication? Yes  Has the patient been seen for an appointment in the last year OR does the patient have an upcoming appointment? Yes  Can we respond through MyChart? Yes  Agent: Please be advised that Rx refills may take up to 3 business days. We ask that you follow-up with your pharmacy.

## 2024-06-09 NOTE — Telephone Encounter (Signed)
 Patient has a new insurance and needs this sent to [facility]

## 2024-06-09 NOTE — Telephone Encounter (Signed)
 Copied from CRM [id number]. Topic: Clinical - Prescription Issue >> Jun 09, 2024  1:42 PM Rea C wrote: Reason for CRM:  Patient called in and stated that she needs to change pharmacies due to insurance and needs all prescriptions sent to [facility].   amLODipine  (NORVASC ) 10 MG tablet atorvastatin  (LIPITOR) 80 MG tablet Cholecalciferol (VITAMIN D -3 PO) EPINEPHrine  (EPIPEN  2-PAK) 0.3 mg/0.3 mL IJ SOAJ injection estradiol  (ESTRACE ) 0.1 MG/GM vaginal cream ezetimibe  (ZETIA ) 10 MG tablet FIBER GUMMIES PO levothyroxine  (SYNTHROID ) 88 MCG tablet olmesartan  (BENICAR ) 20 MG tablet pantoprazole  (PROTONIX ) 40 MG tablet   [facility] [address] [place] - [address] [place] [address] Phone: [phone] Fax: [phone] Hours: Not open 24 hours

## 2024-06-09 NOTE — Telephone Encounter (Signed)
Needs labs and visit

## 2024-06-12 ENCOUNTER — Other Ambulatory Visit: Payer: Self-pay

## 2024-06-12 DIAGNOSIS — K219 Gastro-esophageal reflux disease without esophagitis: Secondary | ICD-10-CM

## 2024-06-12 DIAGNOSIS — I10 Essential (primary) hypertension: Secondary | ICD-10-CM

## 2024-06-12 DIAGNOSIS — E890 Postprocedural hypothyroidism: Secondary | ICD-10-CM

## 2024-06-12 DIAGNOSIS — E119 Type 2 diabetes mellitus without complications: Secondary | ICD-10-CM

## 2024-06-12 DIAGNOSIS — E1169 Type 2 diabetes mellitus with other specified complication: Secondary | ICD-10-CM

## 2024-06-12 MED ORDER — OLMESARTAN MEDOXOMIL 20 MG PO TABS: 20.0000 mg | ORAL_TABLET | Freq: Every day | ORAL | 3 refills | Status: AC

## 2024-06-12 MED ORDER — ATORVASTATIN CALCIUM 80 MG PO TABS: 80.0000 mg | ORAL_TABLET | Freq: Every day | ORAL | 3 refills | Status: AC

## 2024-06-12 MED ORDER — EPINEPHRINE 0.3 MG/0.3ML IJ SOAJ: 0.3000 mg | INTRAMUSCULAR | 1 refills | Status: AC | PRN

## 2024-06-12 MED ORDER — PANTOPRAZOLE SODIUM 40 MG PO TBEC: 40.0000 mg | DELAYED_RELEASE_TABLET | Freq: Every day | ORAL | 2 refills | Status: AC | PRN

## 2024-06-12 MED ORDER — LEVOTHYROXINE SODIUM 88 MCG PO TABS: 88.0000 ug | ORAL_TABLET | Freq: Every day | ORAL | 0 refills | Status: DC

## 2024-06-12 MED ORDER — AMLODIPINE BESYLATE 10 MG PO TABS: 10.0000 mg | ORAL_TABLET | Freq: Every day | ORAL | 3 refills | Status: AC

## 2024-06-12 NOTE — Telephone Encounter (Signed)
Prescription send

## 2024-06-27 LAB — T4, FREE: Free T4: 1.37 ng/dL (ref 0.82–1.77)

## 2024-06-27 LAB — LIPID PANEL
Chol/HDL Ratio: 3.7 ratio (ref 0.0–4.4)
Cholesterol, Total: 164 mg/dL (ref 100–199)
HDL: 44 mg/dL
LDL Chol Calc (NIH): 104 mg/dL — ABNORMAL HIGH (ref 0–99)
Triglycerides: 84 mg/dL (ref 0–149)
VLDL Cholesterol Cal: 16 mg/dL (ref 5–40)

## 2024-06-27 LAB — TSH: TSH: 3.47 u[IU]/mL (ref 0.450–4.500)

## 2024-07-03 ENCOUNTER — Encounter: Payer: Self-pay | Admitting: "Endocrinology

## 2024-07-03 ENCOUNTER — Ambulatory Visit: Admitting: "Endocrinology

## 2024-07-03 VITALS — BP 130/88 | HR 68 | Ht 63.0 in | Wt 205.4 lb

## 2024-07-03 DIAGNOSIS — E890 Postprocedural hypothyroidism: Secondary | ICD-10-CM | POA: Diagnosis not present

## 2024-07-03 DIAGNOSIS — E119 Type 2 diabetes mellitus without complications: Secondary | ICD-10-CM

## 2024-07-03 DIAGNOSIS — E782 Mixed hyperlipidemia: Secondary | ICD-10-CM | POA: Diagnosis not present

## 2024-07-03 LAB — POCT GLYCOSYLATED HEMOGLOBIN (HGB A1C): HbA1c, POC (controlled diabetic range): 6.5 % (ref 0.0–7.0)

## 2024-07-03 MED ORDER — LEVOTHYROXINE SODIUM 88 MCG PO TABS: 88.0000 ug | ORAL_TABLET | Freq: Every day | ORAL | 1 refills | Status: AC

## 2024-07-03 NOTE — Progress Notes (Signed)
 "                                                     07/03/2024, 5:20 PM  Endocrinology follow-up note   Subjective:    Patient ID: Jodi Wade, female    DOB: 1961-05-10, PCP Elnor Lauraine BRAVO, NP   Past Medical History:  Diagnosis Date   Allergy     seasonal   Anemia    Anxiety    Arthritis    knee right   Diabetes mellitus without complication (HCC)    pt.denies,don't   GERD (gastroesophageal reflux disease)    Heart murmur    Hyperlipidemia    controlled   Hypertension    Thyroid  disease    Past Surgical History:  Procedure Laterality Date   BREAST BIOPSY Right    COLONOSCOPY     KNEE ARTHROSCOPY WITH MEDIAL MENISECTOMY Right 01/13/2020   Procedure: KNEE ARTHROSCOPY WITH MEDIAL AND LATERAL  MENISCECTOMY;  Surgeon: Margrette Taft BRAVO, MD;  Location: [facility];  Service: Orthopedics;  Laterality: Right;   PARTIAL HYSTERECTOMY     abdominal   Social History   Socioeconomic History   Marital status: Widowed    Spouse name: Not on file   Number of children: Not on file   Years of education: Not on file   Highest education level: Associate degree: academic program  Occupational History   Not on file  Tobacco Use   Smoking status: Never    Passive exposure: Current (husband smokes)   Smokeless tobacco: Never  Vaping Use   Vaping status: Never Used  Substance and Sexual Activity   Alcohol use: Yes    Comment: Social   Drug use: No   Sexual activity: Yes  Other Topics Concern   Not on file  Social History Narrative   Not on file   Social Drivers of Health   Tobacco Use: Medium Risk (07/03/2024)   Patient History    Smoking Tobacco Use: Never    Smokeless Tobacco Use: Never    Passive Exposure: Current  Financial Resource Strain: Low Risk (12/27/2023)   Overall Financial Resource Strain (CARDIA)    Difficulty of Paying Living Expenses: Not hard at all  Food Insecurity: No Food Insecurity (12/27/2023)   Epic    Worried About Radiation Protection Practitioner of Food in the  Last Year: Never true    Ran Out of Food in the Last Year: Never true  Transportation Needs: No Transportation Needs (12/27/2023)   Epic    Lack of Transportation (Medical): No    Lack of Transportation (Non-Medical): No  Physical Activity: Insufficiently Active (12/27/2023)   Exercise Vital Sign    Days of Exercise per Week: 4 days    Minutes of Exercise per Session: 30 min  Stress: No Stress Concern Present (12/27/2023)   [employer] of Occupational Health - Occupational Stress Questionnaire    Feeling of Stress: Not at all  Social Connections: Moderately Isolated (12/27/2023)   Social Connection and Isolation Panel    Frequency of Communication with Friends and Family: Three times a week    Frequency of Social Gatherings with Friends and Family: Twice a week    Attends Religious Services: 1 to 4 times per year    Active Member of [employer] or Organizations: No    Attends Banker  Meetings: Not on file    Marital Status: Widowed  Depression (PHQ2-9): Low Risk (02/07/2024)   Depression (PHQ2-9)    PHQ-2 Score: 0  Alcohol Screen: Low Risk (12/27/2023)   Alcohol Screen    Last Alcohol Screening Score (AUDIT): 1  Housing: Unknown (12/27/2023)   Epic    Unable to Pay for Housing in the Last Year: No    Number of Times Moved in the Last Year: Not on file    Homeless in the Last Year: No  Utilities: Not on file  Health Literacy: Not on file   Family History  Problem Relation Age of Onset   Cancer Mother    Breast cancer Mother    Thyroid  disease Mother    Allergic rhinitis Father    Thyroid  disease Maternal Aunt    Thyroid  disease Son        Hyperthyroid, not treated   Diabetes Other    Hypertension Other    Colon cancer Neg Hx    Colon polyps Neg Hx    Crohn's disease Neg Hx    Esophageal cancer Neg Hx    Rectal cancer Neg Hx    Stomach cancer Neg Hx    Ulcerative colitis Neg Hx    Outpatient Encounter Medications as of 07/03/2024  Medication Sig   amLODipine   (NORVASC ) 10 MG tablet Take 1 tablet (10 mg total) by mouth daily.   atorvastatin  (LIPITOR) 80 MG tablet Take 1 tablet (80 mg total) by mouth daily.   Cholecalciferol (VITAMIN D -3 PO) Take 1 tablet by mouth daily.   EPINEPHrine  (EPIPEN  2-PAK) 0.3 mg/0.3 mL IJ SOAJ injection Inject 0.3 mg into the muscle as needed for anaphylaxis.   estradiol  (ESTRACE ) 0.1 MG/GM vaginal cream Apply 1 application vaginally at bedtime daily x 2 weeks. Then reduce application to 1 application vaginally at bedtime two times per week.   FIBER GUMMIES PO Take by mouth daily.   levothyroxine  (SYNTHROID ) 88 MCG tablet Take 1 tablet (88 mcg total) by mouth daily before breakfast.   olmesartan  (BENICAR ) 20 MG tablet Take 1 tablet (20 mg total) by mouth daily. TAKE 1 TABLET(20 MG) BY MOUTH DAILY   pantoprazole  (PROTONIX ) 40 MG tablet Take 1 tablet (40 mg total) by mouth daily as needed.   [DISCONTINUED] ezetimibe  (ZETIA ) 10 MG tablet Take 1 tablet (10 mg total) by mouth daily.   [DISCONTINUED] levothyroxine  (SYNTHROID ) 88 MCG tablet Take 1 tablet (88 mcg total) by mouth daily before breakfast.   No facility-administered encounter medications on file as of 07/03/2024.   ALLERGIES: Allergies  Allergen Reactions   Fexofenadine-Pseudoephed Er Other (See Comments)    fast heart rate   Fexofenadine-Pseudoephed Er Other (See Comments)    Other Reaction(s): HEART RACE   Hydrochlorothiazide Other (See Comments)    Hypokalemia   Latex Hives and Rash   Pseudoephedrine Other (See Comments)    Heart Rate increases    VACCINATION STATUS: Immunization History  Administered Date(s) Administered   Influenza-Unspecified 03/24/2020   PFIZER(Purple Top)SARS-COV-2 Vaccination 07/18/2019, 08/08/2019    HPI Jodi Wade is 64 y.o. female who presents today with a medical history as above. she is being seen in follow-up after she was seen in consultation for RAI induced hypothyroidism requested by Elnor Lauraine BRAVO, NP.   - See  notes from her prior visit. - She was following with Dr. Von in [place] until he retired.  Her history includes radioactive iodine thyroid  ablation in 2019 for toxic multinodular goiter. She was  given various dose of levothyroxine  over the years.  She is currently on levothyroxine  88 mcg p.o. daily before breakfast.  Her previsit thyroid  function test are consistent with appropriate replacement.      She reports good compliance and consistency taking her medication.  She also has type 2 diabetes with point-of-care A1c today 6.5% .  She is not on medications.    She is only on diet and exercise program at this time.  She denies any family history of thyroid  dysfunction or thyroid  malignancy. Her other medical problems include hypertension, hyperlipidemia.  Her other medications include amlodipine  10 mg p.o. daily, atorvastatin  40 mg p.o. nightly, vitamin D3, Benicar  20 mg p.o. daily.   Review of Systems  Constitutional: +mildly fluctuating body weight , no fatigue, no subjective hyperthermia, no subjective hypothermia Eyes: no blurry vision, no xerophthalmia   Objective:       07/03/2024    2:49 PM 02/07/2024    1:22 PM 12/27/2023    3:30 PM  Vitals with BMI  Height 5' 3 5' 3 5' 3  Weight 205 lbs 6 oz  200 lbs 6 oz  BMI 36.39  35.51  Systolic 130 126 871  Diastolic 88 78 78  Pulse 68 71 88    BP 130/88   Pulse 68   Ht 5' 3 (1.6 m)   Wt 205 lb 6.4 oz (93.2 kg)   BMI 36.38 kg/m   Wt Readings from Last 3 Encounters:  07/03/24 205 lb 6.4 oz (93.2 kg)  12/27/23 200 lb 6.4 oz (90.9 kg)  12/03/23 201 lb (91.2 kg)    Physical Exam  Constitutional:  Body mass index is 36.38 kg/m.,  not in acute distress, normal state of mind Eyes: PERRLA, EOMI, no exophthalmos   CMP ( most recent) CMP     Component Value Date/Time   NA 143 02/07/2024 1420   NA 142 11/17/2022 1100   K 3.8 02/07/2024 1420   CL 106 02/07/2024 1420   CO2 29 02/07/2024 1420   GLUCOSE 93 02/07/2024  1420   BUN 10 02/07/2024 1420   BUN 10 11/17/2022 1100   CREATININE 0.72 02/07/2024 1420   CALCIUM  9.6 02/07/2024 1420   PROT 7.9 02/07/2024 1420   PROT 6.9 11/17/2022 1100   ALBUMIN 4.5 02/07/2024 1420   ALBUMIN 4.4 11/17/2022 1100   AST 18 02/07/2024 1420   ALT 18 02/07/2024 1420   ALKPHOS 77 02/07/2024 1420   BILITOT 0.6 02/07/2024 1420   BILITOT 0.4 11/17/2022 1100   GFR 89.00 02/07/2024 1420   EGFR 81 11/17/2022 1100   GFRNONAA >60 01/09/2020 1455     Diabetic Labs (most recent): Lab Results  Component Value Date   HGBA1C 6.5 07/03/2024   HGBA1C 6.2 12/03/2023   HGBA1C 6.6 (H) 08/17/2023   MICROALBUR 45.9 (H) 12/27/2023   MICROALBUR 1.3 08/17/2023     Lipid Panel ( most recent) Lipid Panel     Component Value Date/Time   CHOL 164 06/26/2024 0923   TRIG 84 06/26/2024 0923   HDL 44 06/26/2024 0923   CHOLHDL 3.7 06/26/2024 0923   CHOLHDL 4 02/07/2024 1420   VLDL 20.2 02/07/2024 1420   LDLCALC 104 (H) 06/26/2024 0923   LABVLDL 16 06/26/2024 0923      Lab Results  Component Value Date   TSH 3.470 06/26/2024   TSH 1.610 11/19/2023   TSH 1.05 08/17/2023   TSH 3.19 11/13/2022   TSH 3.10 02/03/2022   FREET4 1.37 06/26/2024  FREET4 1.23 11/19/2023   FREET4 0.93 11/13/2022   FREET4 1.20 06/28/2021   FREET4 0.90 04/23/2020      Assessment & Plan:   1. Postablative hypothyroidism (Primary) 2. Type 2 diabetes mellitus without complication, without long-term current use of insulin (HCC)   - I have reviewed her  new and available  records and clinically evaluated the patient. - Based on these reviews, she has RAI induced hypothyroidism, type 2 diabetes. Her previsit thyroid  function tests are consistent with appropriate replacement.  She is advised to continue levothyroxine  88 mcg p.o. daily before breakfast.    - We discussed about the correct intake of her thyroid  hormone, on empty stomach at fasting, with water, separated by at least 30 minutes from  breakfast and other medications,  and separated by more than 4 hours from calcium , iron, multivitamins, acid reflux medications (PPIs). -Patient is made aware of the fact that thyroid  hormone replacement is needed for life, dose to be adjusted by periodic monitoring of thyroid  function tests.  Regarding her type 2 diabetes, not on medications.  Her point of A1c 6.5% today, -she was offered intervention with low-dose metformin, however she declined this option at this time.  Optimal exercise and avoiding processed carbs were discussed with her.   She is advised to continue her current medications for hypertension and hyperlipidemia-including amlodipine  10 mg daily, atorvastatin  80 mg p.o. daily, Benicar  20 mg p.o. daily.  She has not taken Zetia  which is still active on her medication list.  - she is advised to maintain close follow up with Elnor Lauraine BRAVO, NP for primary care needs.  I spent  25  minutes in the care of the patient today including review of labs from Thyroid  Function, CMP, and other relevant labs ; imaging/biopsy records (current and previous including abstractions from other facilities); face-to-face time discussing  her lab results and symptoms, medications doses, her options of short and long term treatment based on the latest standards of care / guidelines;   and documenting the encounter.  Jodi Wade  participated in the discussions, expressed understanding, and voiced agreement with the above plans.  All questions were answered to her satisfaction. she is encouraged to contact clinic should she have any questions or concerns prior to her return visit. Dear Patient: Feel free to review your progress notes.  If you are reviewing this progress note and have questions about the meaning of /or medical terms being used, please make a note and address it at your next follow-up appointment.  Medical notes are meant to be a communication tool between medical professionals and require  medical terms to be used for efficiency and insurance approval.   Follow up plan: Return in about 6 months (around 12/31/2024) for F/U with Pre-visit Labs, A1c -NV.   Ranny Earl, MD [facility] [address] [place], [place] [address] Phone: [phone]  Fax: [phone]     07/03/2024, 5:20 PM  This note was partially dictated with voice recognition software. Similar sounding words can be transcribed inadequately or may not  be corrected upon review.  "

## 2024-07-03 NOTE — Patient Instructions (Signed)

## 2024-07-24 ENCOUNTER — Ambulatory Visit: Payer: Self-pay | Admitting: Nurse Practitioner

## 2025-01-01 ENCOUNTER — Ambulatory Visit: Admitting: "Endocrinology
